# Patient Record
Sex: Female | Born: 1955 | Race: Black or African American | Hispanic: No | Marital: Single | Smoking: Never smoker
Health system: Southern US, Community
[De-identification: ages and names within clinical notes are randomized; demographics above are authoritative.]

## PROBLEM LIST (undated history)

## (undated) DIAGNOSIS — J301 Allergic rhinitis due to pollen: Secondary | ICD-10-CM

## (undated) DIAGNOSIS — E785 Hyperlipidemia, unspecified: Secondary | ICD-10-CM

## (undated) DIAGNOSIS — E039 Hypothyroidism, unspecified: Secondary | ICD-10-CM

## (undated) DIAGNOSIS — M1990 Unspecified osteoarthritis, unspecified site: Secondary | ICD-10-CM

## (undated) DIAGNOSIS — I10 Essential (primary) hypertension: Secondary | ICD-10-CM

## (undated) DIAGNOSIS — E669 Obesity, unspecified: Secondary | ICD-10-CM

## (undated) DIAGNOSIS — E079 Disorder of thyroid, unspecified: Secondary | ICD-10-CM

## (undated) HISTORY — DX: Disorder of thyroid, unspecified: E07.9

## (undated) HISTORY — DX: Hyperlipidemia, unspecified: E78.5

## (undated) HISTORY — DX: Essential (primary) hypertension: I10

## (undated) HISTORY — DX: Obesity, unspecified: E66.9

## (undated) HISTORY — PX: BIOPSY THYROID: PRO38

## (undated) HISTORY — PX: COLONOSCOPY: SHX174

## (undated) HISTORY — DX: Allergic rhinitis due to pollen: J30.1

---

## 1997-12-29 ENCOUNTER — Other Ambulatory Visit: Admission: RE | Admit: 1997-12-29 | Discharge: 1997-12-29 | Payer: Self-pay | Admitting: *Deleted

## 1998-11-07 ENCOUNTER — Other Ambulatory Visit: Admission: RE | Admit: 1998-11-07 | Discharge: 1998-11-07 | Payer: Self-pay | Admitting: *Deleted

## 1999-04-19 ENCOUNTER — Encounter: Admission: RE | Admit: 1999-04-19 | Discharge: 1999-04-19 | Payer: Self-pay | Admitting: *Deleted

## 1999-04-19 ENCOUNTER — Encounter: Payer: Self-pay | Admitting: *Deleted

## 1999-11-22 ENCOUNTER — Other Ambulatory Visit: Admission: RE | Admit: 1999-11-22 | Discharge: 1999-11-22 | Payer: Self-pay | Admitting: *Deleted

## 2000-06-12 ENCOUNTER — Other Ambulatory Visit: Admission: RE | Admit: 2000-06-12 | Discharge: 2000-06-12 | Payer: Self-pay | Admitting: *Deleted

## 2003-04-08 ENCOUNTER — Encounter: Admission: RE | Admit: 2003-04-08 | Discharge: 2003-04-08 | Payer: Self-pay | Admitting: Obstetrics and Gynecology

## 2005-10-18 ENCOUNTER — Emergency Department (HOSPITAL_COMMUNITY): Admission: EM | Admit: 2005-10-18 | Discharge: 2005-10-18 | Payer: Self-pay | Admitting: Emergency Medicine

## 2005-11-01 ENCOUNTER — Encounter: Admission: RE | Admit: 2005-11-01 | Discharge: 2005-11-26 | Payer: Self-pay | Admitting: Sports Medicine

## 2006-12-17 ENCOUNTER — Ambulatory Visit: Payer: Self-pay | Admitting: General Practice

## 2007-12-23 ENCOUNTER — Ambulatory Visit: Payer: Self-pay | Admitting: General Practice

## 2008-11-24 ENCOUNTER — Encounter: Admission: RE | Admit: 2008-11-24 | Discharge: 2008-11-24 | Payer: Self-pay | Admitting: Pulmonary Disease

## 2008-11-27 ENCOUNTER — Emergency Department (HOSPITAL_BASED_OUTPATIENT_CLINIC_OR_DEPARTMENT_OTHER): Admission: EM | Admit: 2008-11-27 | Discharge: 2008-11-27 | Payer: Self-pay | Admitting: Emergency Medicine

## 2008-12-29 ENCOUNTER — Ambulatory Visit: Payer: Self-pay | Admitting: General Practice

## 2009-12-21 ENCOUNTER — Ambulatory Visit: Payer: Self-pay | Admitting: General Practice

## 2010-01-10 ENCOUNTER — Ambulatory Visit: Payer: Self-pay | Admitting: General Practice

## 2010-12-20 ENCOUNTER — Ambulatory Visit: Payer: Self-pay

## 2011-07-12 IMAGING — CR DG CHEST 2V
2 series · 2 of 2 positions shown · non-contrast
Comparison: None

CLINICAL DATA: Cough

CHEST - 2 VIEW

[view not recorded (1 of 2)]
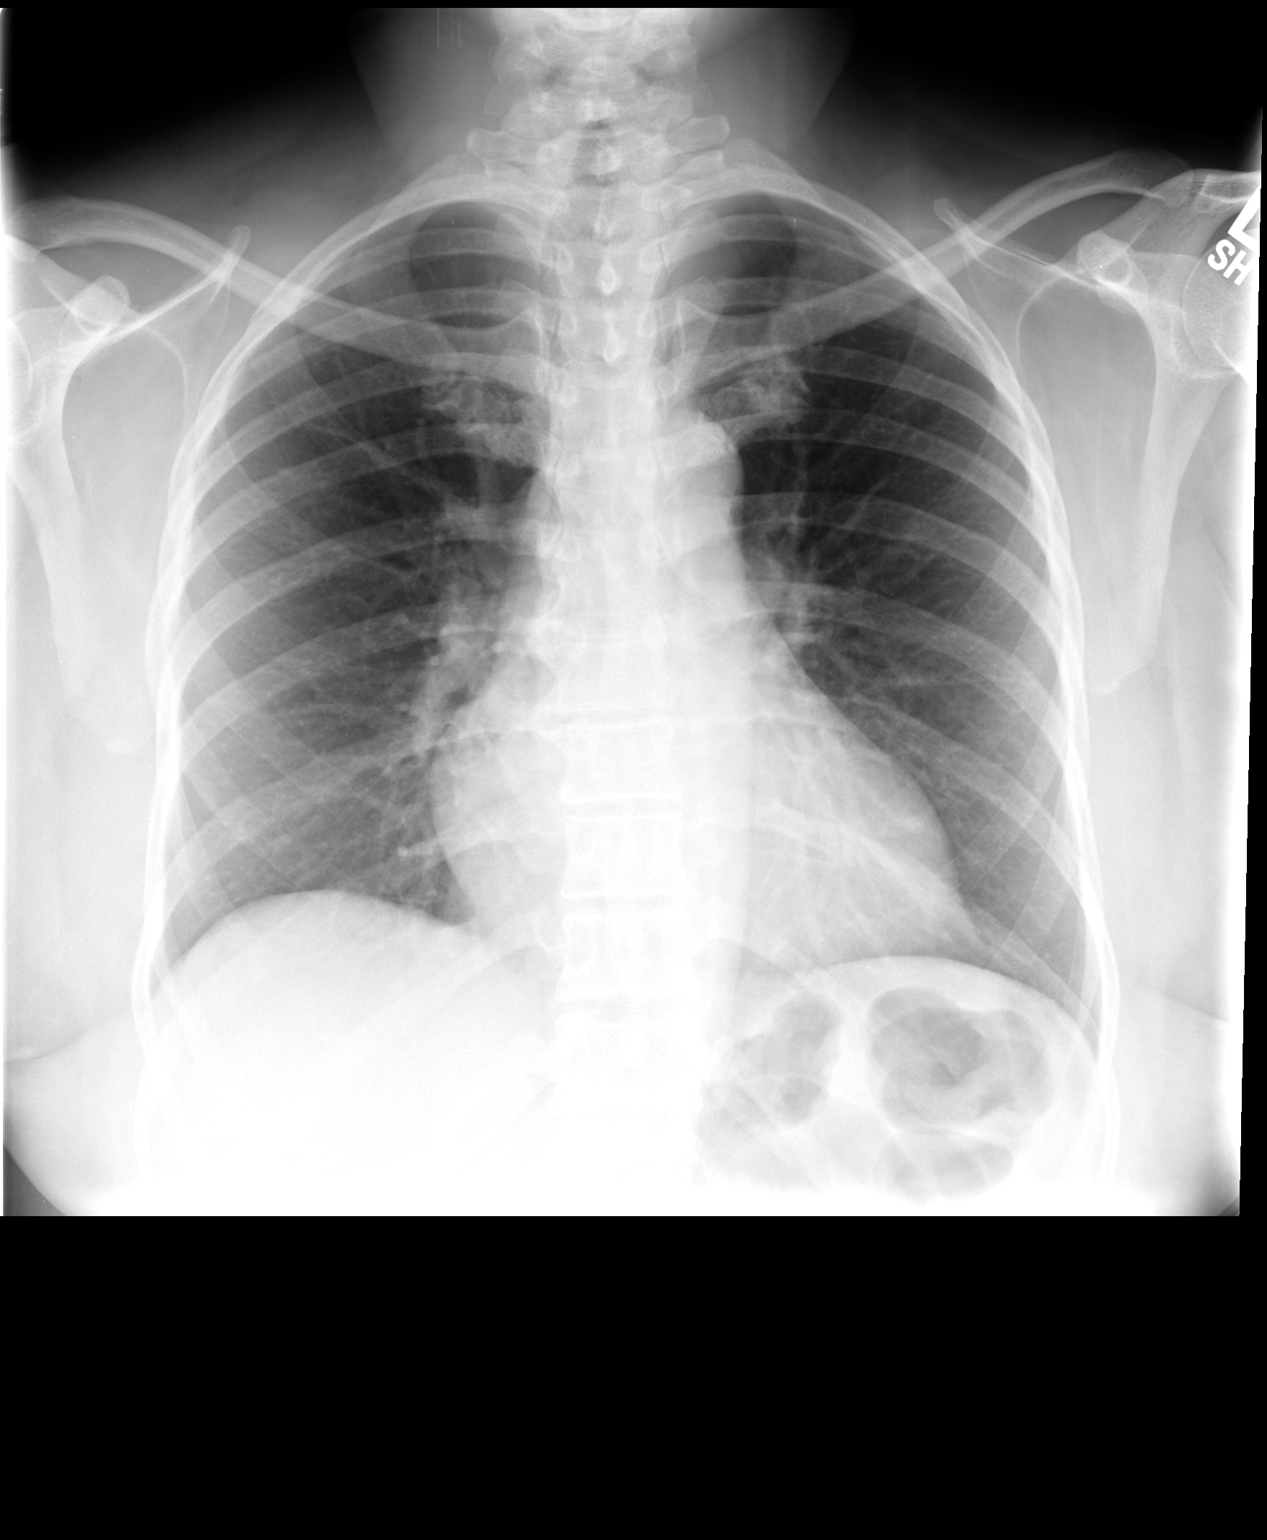

[view not recorded (2 of 2)]
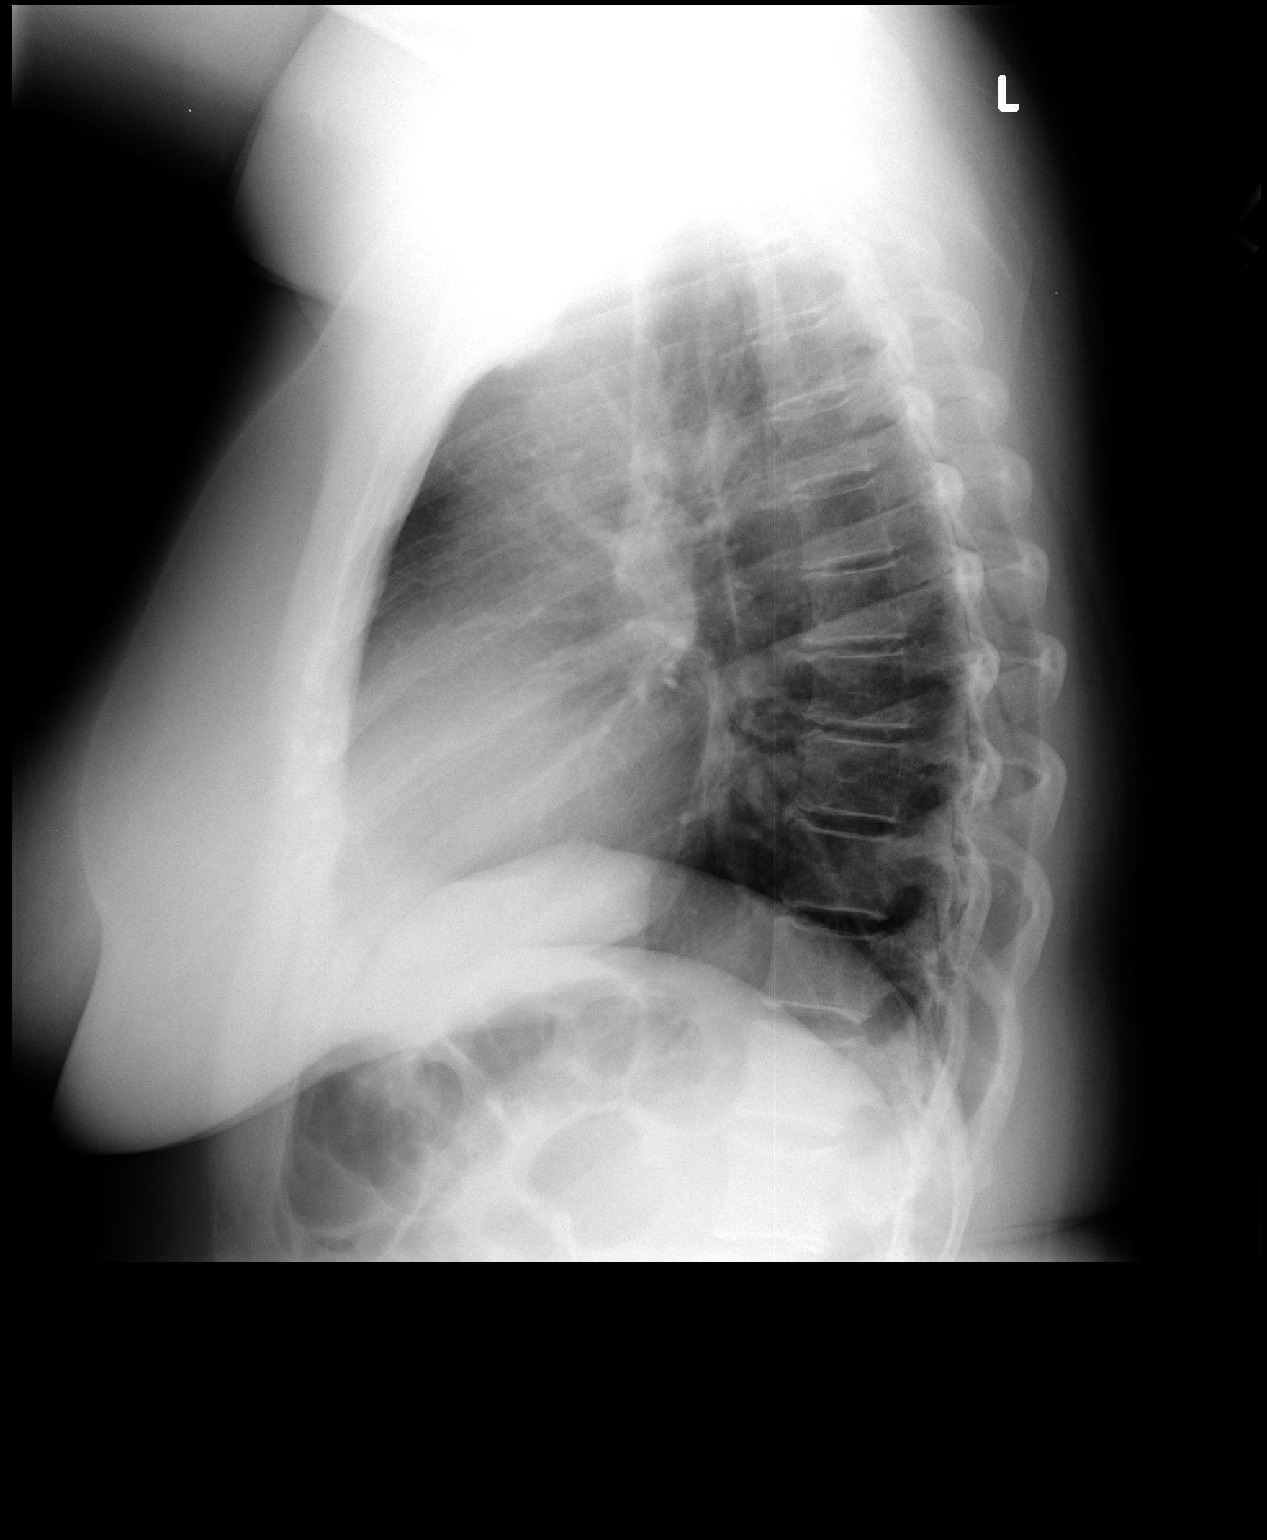

[2 of 2 positions shown; findings below may reference images not displayed]

FINDINGS: Heart size slightly exceeds normal variation.  There is
no congestive heart failure, pleural fluid, or active airspace
disease.  Osseous structures intact.
IMPRESSION: Mild cardiac enlargement - no active disease.

## 2011-12-19 ENCOUNTER — Ambulatory Visit: Payer: Self-pay

## 2012-04-21 ENCOUNTER — Encounter: Payer: Self-pay | Admitting: Family Medicine

## 2012-05-29 ENCOUNTER — Encounter: Payer: Self-pay | Admitting: Family Medicine

## 2012-05-29 ENCOUNTER — Ambulatory Visit (INDEPENDENT_AMBULATORY_CARE_PROVIDER_SITE_OTHER): Payer: PRIVATE HEALTH INSURANCE | Admitting: Family Medicine

## 2012-05-29 VITALS — BP 140/90 | HR 70 | Temp 98.1°F | Ht 61.0 in | Wt 190.4 lb

## 2012-05-29 DIAGNOSIS — I10 Essential (primary) hypertension: Secondary | ICD-10-CM

## 2012-05-29 DIAGNOSIS — M170 Bilateral primary osteoarthritis of knee: Secondary | ICD-10-CM

## 2012-05-29 DIAGNOSIS — E039 Hypothyroidism, unspecified: Secondary | ICD-10-CM

## 2012-05-29 DIAGNOSIS — Z1331 Encounter for screening for depression: Secondary | ICD-10-CM

## 2012-05-29 LAB — HEPATIC FUNCTION PANEL
AST: 34 U/L (ref 0–37)
Alkaline Phosphatase: 81 U/L (ref 39–117)
Total Bilirubin: 0.4 mg/dL (ref 0.3–1.2)

## 2012-05-29 LAB — BASIC METABOLIC PANEL
GFR: 136.2 mL/min (ref >60.00)
Potassium: 3.8 mEq/L (ref 3.5–5.1)
Sodium: 135 mEq/L (ref 135–145)

## 2012-05-29 LAB — TSH: TSH: 0.43 u[IU]/mL (ref 0.35–5.50)

## 2012-05-29 LAB — CBC WITH DIFFERENTIAL/PLATELET
Eosinophils Absolute: 0.1 10*3/uL (ref 0.0–0.7)
MCHC: 33.5 g/dL (ref 30.0–36.0)
MCV: 90.6 fl (ref 78.0–100.0)
Monocytes Absolute: 0.5 10*3/uL (ref 0.1–1.0)
Neutrophils Relative %: 56.6 % (ref 43.0–77.0)
Platelets: 203 10*3/uL (ref 150.0–400.0)
WBC: 6.4 10*3/uL (ref 4.5–10.5)

## 2012-05-29 LAB — LIPID PANEL
HDL: 65.8 mg/dL (ref >39.00)
VLDL: 16 mg/dL (ref 0.0–40.0)

## 2012-05-29 LAB — LDL CHOLESTEROL, DIRECT: Direct LDL: 146 mg/dL

## 2012-05-29 MED ORDER — NAPROXEN 500 MG PO TABS: 500.0000 mg | ORAL_TABLET | Freq: Two times a day (BID) | ORAL | Status: AC

## 2012-05-29 NOTE — Progress Notes (Signed)
  Subjective:    Patient ID: Jamie Kelley, female    DOB: 10-Jul-1955, 57 y.o.   MRN: [id number]  HPI New to establish.  Previous MD- Dagoberto Ligas.  Allergist- Foster  Has been getting acute care at work (has onsite PA)  HTN- chronic problem, 1st dx'd 6-7 yrs ago.  On Atenolol.  Reports BP is slightly elevated today compared to her usual readings.  No CP, SOB, HAs, visual changes, edema.  Hypothyroid- chronic problem, on Levothyroxine x7-8 yrs.  Has not had labs in ~1 yr.  Denies excessive fatigue, heat/cold intolerance.  Leg pain- pt reports this has been occuring at night 'they'll spasm real bad' for ~1 yr.  In last month, has developed day time sxs and having pain from knees down bilaterally.  Day time pain is concentrated in knees and radiates downward.  Will impact her gait.  Ibuprofen provides some relief.  No relief w/ flexeril.  No pain in low back or buttock.   Review of Systems For ROS see HPI     Objective:   Physical Exam  Vitals reviewed. Constitutional: She is oriented to person, place, and time. She appears well-developed and well-nourished. No distress.  HENT:  Head: Normocephalic and atraumatic.  Eyes: Conjunctivae and EOM are normal. Pupils are equal, round, and reactive to light.  Neck: Normal range of motion. Neck supple. No thyromegaly present.  Cardiovascular: Normal rate, regular rhythm, normal heart sounds and intact distal pulses.   No murmur heard. Pulmonary/Chest: Effort normal and breath sounds normal. No respiratory distress.  Abdominal: Soft. She exhibits no distension. There is no tenderness.  Musculoskeletal: She exhibits no edema.  Obvious arthritic changes of bilateral knees.  Crepitus bilaterally, R>L.  + TTP along both medial and lateral joint lines bilaterally.  Lymphadenopathy:    She has no cervical adenopathy.  Neurological: She is alert and oriented to person, place, and time.  Skin: Skin is warm and dry.  Psychiatric: She has a normal mood and  affect. Her behavior is normal.          Assessment & Plan:

## 2012-05-29 NOTE — Patient Instructions (Addendum)
Please schedule your complete physical at your convenience We'll notify you of your lab results and make any changes if needed We'll call you with your ortho appt Start the Naproxen twice daily Ice or heat your knees Call with any questions or concerns Welcome!  We're glad to have you!

## 2012-05-30 ENCOUNTER — Telehealth: Payer: Self-pay | Admitting: *Deleted

## 2012-05-30 DIAGNOSIS — E785 Hyperlipidemia, unspecified: Secondary | ICD-10-CM

## 2012-05-30 MED ORDER — SIMVASTATIN 20 MG PO TABS: 20.0000 mg | ORAL_TABLET | Freq: Every day | ORAL | Status: DC

## 2012-05-30 NOTE — Telephone Encounter (Signed)
Spoke with the pt and informed her of her recent lab results and note.  Pt understood and agreed.  Pt will call back to schedule an appt for lab visit or CPE.  New rx sent to the pharmacy by e-script, and lab order entered.//AB/CMA

## 2012-05-30 NOTE — Telephone Encounter (Signed)
Message copied by Verdie Shire on Fri May 30, 2012  9:52 AM ------      Message from: Sheliah Hatch      Created: Thu May 29, 2012  4:17 PM       Labs look good w/ exception of total cholesterol and LDL- both mildly elevated.  Based on this would recommend start Zocor 20mg  nightly.  Needs to f/u in 6 months to recheck lipids. ------

## 2012-06-01 NOTE — Assessment & Plan Note (Signed)
New.  Appears to be severe.  Start daily NSAID.  Tylenol prn.  Refer to ortho for complete evaluation and tx.  Reviewed supportive care and red flags that should prompt return.  Pt expressed understanding and is in agreement w/ plan.

## 2012-06-01 NOTE — Assessment & Plan Note (Addendum)
New to provider, chronic for pt.  Control adequate but not idea.  Will not change meds at first visit but will follow closely.  Reviewed importance of low Na diet and regular exercise.  Pt expressed understanding and is in agreement w/ plan.

## 2012-06-01 NOTE — Assessment & Plan Note (Signed)
New to provider, chronic for pt.  Was following w/ endo.  Has not had labs in over a year.  Check labs.  Adjust meds prn.

## 2012-12-25 ENCOUNTER — Ambulatory Visit: Payer: Self-pay | Admitting: General Practice

## 2013-12-22 ENCOUNTER — Ambulatory Visit: Payer: Self-pay | Admitting: General Practice

## 2013-12-22 LAB — HM MAMMOGRAPHY

## 2013-12-25 ENCOUNTER — Encounter: Payer: Self-pay | Admitting: General Practice

## 2014-03-14 ENCOUNTER — Emergency Department (HOSPITAL_COMMUNITY)
Admission: EM | Admit: 2014-03-14 | Discharge: 2014-03-14 | Disposition: A | Discharge status: Home / Self Care | Payer: PRIVATE HEALTH INSURANCE | Source: Home / Self Care | Attending: Emergency Medicine | Admitting: Emergency Medicine

## 2014-03-14 ENCOUNTER — Encounter (HOSPITAL_COMMUNITY): Payer: Self-pay | Admitting: *Deleted

## 2014-03-14 DIAGNOSIS — J018 Other acute sinusitis: Secondary | ICD-10-CM

## 2014-03-14 MED ORDER — AZITHROMYCIN 250 MG PO TABS: ORAL_TABLET | ORAL | Status: DC

## 2014-03-14 NOTE — ED Notes (Signed)
C/o  bad headache x 3 weeks, bil. earache, chills and gets hot, some cough and aching in back of neck and shoulders x 1 week.  No sore throat.  Saw a PA at work and diagnosed with sinus infection and was given Amoxicillin but is no better.

## 2014-03-14 NOTE — Discharge Instructions (Signed)
You have a sinus infection. Stop the amoxicillin. Start Azithromycin. Use nasal saline spray as often as you can. Follow up as needed.   Sinusitis Sinusitis is redness, soreness, and puffiness (inflammation) of the air pockets in the bones of your face (sinuses). The redness, soreness, and puffiness can cause air and mucus to get trapped in your sinuses. This can allow germs to grow and cause an infection.  HOME CARE   Drink enough fluids to keep your pee (urine) clear or pale yellow.  Use a humidifier in your home.  Run a hot shower to create steam in the bathroom. Sit in the bathroom with the door closed. Breathe in the steam 3-4 times a day.  Put a warm, moist washcloth on your face 3-4 times a day, or as told by your doctor.  Use salt water sprays (saline sprays) to wet the thick fluid in your nose. This can help the sinuses drain.  Only take medicine as told by your doctor. GET HELP RIGHT AWAY IF:   Your pain gets worse.  You have very bad headaches.  You are sick to your stomach (nauseous).  You throw up (vomit).  You are very sleepy (drowsy) all the time.  Your face is puffy (swollen).  Your vision changes.  You have a stiff neck.  You have trouble breathing. MAKE SURE YOU:   Understand these instructions.  Will watch your condition.  Will get help right away if you are not doing well or get worse. Document Released: 08/01/2007 Document Revised: 11/07/2011 Document Reviewed: 09/18/2011 [facility] Patient Information 2015 [place], [place]. This information is not intended to replace advice given to you by your health care provider. Make sure you discuss any questions you have with your health care provider.

## 2014-03-14 NOTE — ED Provider Notes (Signed)
CSN: [id number]     Arrival date & time 03/14/14  0901 History   First MD Initiated Contact with Patient 03/14/14 0914     Chief Complaint  Patient presents with  . Facial Pain   (Consider location/radiation/quality/duration/timing/severity/associated sxs/prior Treatment) HPI  She is a 59 year old woman here for evaluation of sinus issue. She states she has had headaches, sinus pressure, earaches, chills and sweats for the last 5 days. This is in the setting of URI symptoms 2-3 weeks ago. No nausea or vomiting. She does report fatigue. Some muscle aches in her neck and shoulders.  She was seen by a PA at work and diagnosed with a sinus infection. She was started on amoxicillin, but this has not helped.  Past Medical History  Diagnosis Date  . Hay fever   . High blood pressure   . Thyroid disease    History reviewed. No pertinent past surgical history. Family History  Problem Relation Age of Onset  . Heart disease Mother   . Hypertension Mother   . Diabetes Mother   . Alzheimer's disease Mother   . Cancer Maternal Aunt     breast  . Heart disease Maternal Aunt   . Hypertension Maternal Aunt   . Hypertension Maternal Uncle   . Heart disease Maternal Uncle   . Heart disease Maternal Grandmother   . Hypertension Maternal Grandmother   . Diabetes Maternal Grandmother    History  Substance Use Topics  . Smoking status: Never Smoker   . Smokeless tobacco: Not on file  . Alcohol Use: No   OB History    No data available     Review of Systems  Constitutional: Positive for fever and chills.  HENT: Positive for congestion and sinus pressure. Negative for rhinorrhea and sore throat.   Respiratory: Positive for cough. Negative for shortness of breath.   Gastrointestinal: Negative for nausea, vomiting and abdominal pain.  Musculoskeletal: Positive for myalgias.  Neurological: Positive for headaches.    Allergies  Review of patient's allergies indicates no known  allergies.  Home Medications   Prior to Admission medications   Medication Sig Start Date End Date Taking? Authorizing Provider  atenolol (TENORMIN) 50 MG tablet Take 100 mg by mouth daily.   Yes Historical Provider, MD  diphenhydrAMINE (BENADRYL) 25 mg capsule Take 25 mg by mouth every 6 (six) hours as needed for itching.   Yes Historical Provider, MD  ibuprofen (ADVIL,MOTRIN) 200 MG tablet Take 400 mg by mouth every 6 (six) hours as needed for pain.   Yes Historical Provider, MD  levothyroxine (SYNTHROID, LEVOTHROID) 50 MCG tablet Take 25 mcg by mouth daily before breakfast.   Yes Historical Provider, MD  simvastatin (ZOCOR) 20 MG tablet Take 1 tablet (20 mg total) by mouth at bedtime. 05/30/12  Yes Midge Minium, MD  azithromycin (ZITHROMAX Z-PAK) 250 MG tablet Take 2 pills today, then 1 pill daily until gone 03/14/14   Melony Overly, MD  benzonatate (TESSALON) 200 MG capsule Take 200 mg by mouth 3 (three) times daily as needed for cough.    Historical Provider, MD  Chlorpheniramine-DM (CORICIDIN COUGH/COLD) 4-30 MG TABS Take 2 tablets by mouth 2 (two) times daily.    Historical Provider, MD  chlorpheniramine-HYDROcodone (TUSSIONEX) 10-8 MG/5ML LQCR Take 5 mLs by mouth at bedtime as needed.    Historical Provider, MD  cyclobenzaprine (FLEXERIL) 10 MG tablet Take 10 mg by mouth at bedtime as needed for muscle spasms.    Historical Provider, MD  Homeopathic Products ([employer] LEG CRAMPS PAIN RELIEF PO) Take 2 capsules by mouth at bedtime as needed.    Historical Provider, MD   BP 164/99 mmHg  Pulse 68  Temp(Src) 99 F (37.2 C) (Oral)  Resp 16  SpO2 98%  LMP 03/10/2012 Physical Exam  Constitutional: She is oriented to person, place, and time. She appears well-developed and well-nourished. No distress.  HENT:  Head: Normocephalic and atraumatic.  Right Ear: Tympanic membrane normal.  Left Ear: Tympanic membrane normal.  Nose: Mucosal edema (with erythema) present. Right sinus exhibits  maxillary sinus tenderness and frontal sinus tenderness. Left sinus exhibits no maxillary sinus tenderness and no frontal sinus tenderness.  Mouth/Throat: Oropharynx is clear and moist. No oropharyngeal exudate.  Eyes: Conjunctivae are normal.  Neck: Neck supple.  Cardiovascular: Normal rate, regular rhythm and normal heart sounds.   No murmur heard. Pulmonary/Chest: Effort normal and breath sounds normal. No respiratory distress. She has no wheezes. She has no rales.  Lymphadenopathy:    She has no cervical adenopathy.  Neurological: She is alert and oriented to person, place, and time.    ED Course  Procedures (including critical care time) Labs Review Labs Reviewed - No data to display  Imaging Review No results found.   MDM   1. Other acute sinusitis    We'll change antibiotic to azithromycin. Also recommended frequent use of nasal saline spray. Follow-up as needed.    Melony Overly, MD 03/14/14 [phone]

## 2014-10-22 ENCOUNTER — Encounter: Payer: Self-pay | Admitting: Family Medicine

## 2014-10-22 ENCOUNTER — Ambulatory Visit (INDEPENDENT_AMBULATORY_CARE_PROVIDER_SITE_OTHER): Payer: PRIVATE HEALTH INSURANCE | Admitting: Family Medicine

## 2014-10-22 ENCOUNTER — Encounter: Payer: Self-pay | Admitting: General Practice

## 2014-10-22 VITALS — BP 134/82 | HR 86 | Temp 98.0°F | Resp 16 | Ht 61.0 in | Wt 196.0 lb

## 2014-10-22 DIAGNOSIS — E785 Hyperlipidemia, unspecified: Secondary | ICD-10-CM

## 2014-10-22 DIAGNOSIS — E010 Iodine-deficiency related diffuse (endemic) goiter: Secondary | ICD-10-CM | POA: Insufficient documentation

## 2014-10-22 DIAGNOSIS — I10 Essential (primary) hypertension: Secondary | ICD-10-CM

## 2014-10-22 DIAGNOSIS — E038 Other specified hypothyroidism: Secondary | ICD-10-CM

## 2014-10-22 DIAGNOSIS — E049 Nontoxic goiter, unspecified: Secondary | ICD-10-CM | POA: Diagnosis not present

## 2014-10-22 LAB — HEPATIC FUNCTION PANEL
ALK PHOS: 76 U/L (ref 39–117)
ALT: 16 U/L (ref 0–35)
AST: 19 U/L (ref 0–37)
Albumin: 4 g/dL (ref 3.5–5.2)
BILIRUBIN DIRECT: 0.1 mg/dL (ref 0.0–0.3)
TOTAL PROTEIN: 7.1 g/dL (ref 6.0–8.3)
Total Bilirubin: 0.5 mg/dL (ref 0.2–1.2)

## 2014-10-22 LAB — CBC WITH DIFFERENTIAL/PLATELET
BASOS PCT: 0.6 % (ref 0.0–3.0)
Basophils Absolute: 0 10*3/uL (ref 0.0–0.1)
EOS ABS: 0.1 10*3/uL (ref 0.0–0.7)
Eosinophils Relative: 1.7 % (ref 0.0–5.0)
HEMATOCRIT: 39.8 % (ref 36.0–46.0)
Hemoglobin: 13.4 g/dL (ref 12.0–15.0)
Lymphocytes Relative: 32.4 % (ref 12.0–46.0)
Lymphs Abs: 1.9 10*3/uL (ref 0.7–4.0)
MCHC: 33.7 g/dL (ref 30.0–36.0)
MCV: 89.9 fl (ref 78.0–100.0)
MONO ABS: 0.4 10*3/uL (ref 0.1–1.0)
Monocytes Relative: 6.7 % (ref 3.0–12.0)
NEUTROS ABS: 3.5 10*3/uL (ref 1.4–7.7)
Neutrophils Relative %: 58.6 % (ref 43.0–77.0)
PLATELETS: 189 10*3/uL (ref 150.0–400.0)
RBC: 4.43 Mil/uL (ref 3.87–5.11)
RDW: 15.1 % (ref 11.5–15.5)
WBC: 6 10*3/uL (ref 4.0–10.5)

## 2014-10-22 LAB — BASIC METABOLIC PANEL
BUN: 13 mg/dL (ref 6–23)
CHLORIDE: 106 meq/L (ref 96–112)
CO2: 28 meq/L (ref 19–32)
Calcium: 9.3 mg/dL (ref 8.4–10.5)
Creatinine, Ser: 0.6 mg/dL (ref 0.40–1.20)
GFR: 132.43 mL/min (ref >60.00)
Glucose, Bld: 92 mg/dL (ref 70–99)
Potassium: 3.8 mEq/L (ref 3.5–5.1)
SODIUM: 142 meq/L (ref 135–145)

## 2014-10-22 LAB — LIPID PANEL
Cholesterol: 156 mg/dL (ref 0–200)
HDL: 62.4 mg/dL (ref >39.00)
LDL Cholesterol: 81 mg/dL (ref 0–99)
NONHDL: 93.95
Total CHOL/HDL Ratio: 3
Triglycerides: 67 mg/dL (ref 0.0–149.0)
VLDL: 13.4 mg/dL (ref 0.0–40.0)

## 2014-10-22 LAB — TSH: TSH: 0.97 u[IU]/mL (ref 0.35–4.50)

## 2014-10-22 MED ORDER — ATENOLOL 50 MG PO TABS: 100.0000 mg | ORAL_TABLET | Freq: Every day | ORAL | Status: DC

## 2014-10-22 MED ORDER — SIMVASTATIN 20 MG PO TABS: 20.0000 mg | ORAL_TABLET | Freq: Every day | ORAL | Status: DC

## 2014-10-22 MED ORDER — HYDROCHLOROTHIAZIDE 25 MG PO TABS: 25.0000 mg | ORAL_TABLET | Freq: Every day | ORAL | Status: DC

## 2014-10-22 MED ORDER — LEVOTHYROXINE SODIUM 50 MCG PO TABS: 25.0000 ug | ORAL_TABLET | Freq: Every day | ORAL | Status: DC

## 2014-10-22 NOTE — Progress Notes (Signed)
   Subjective:    Patient ID: Jamie Kelley, female    DOB: May 27, 1955, 59 y.o.   MRN: [id number]  HPI HTN- chronic problem, on Atenolol, HCTZ.  BP mildly elevated today.  No CP, SOB, HAs, visual changes, edema.  Pt has NP at work that she sees yearly for blood work and periodically for BP checks.  Hyperlipidemia- chronic problem, on Simvastatin daily.  Overdue for labs.  No abd pain, N/V, myalgias.  No regular exercise.  Hypothyroid- chronic problem, on Levothyroxine 19mcg daily.  Denies excessive fatigue.  No changes to skin, hair, nails.  Review of Systems For ROS see HPI     Objective:   Physical Exam  Constitutional: She is oriented to person, place, and time. She appears well-developed and well-nourished. No distress.  HENT:  Head: Normocephalic and atraumatic.  Eyes: Conjunctivae and EOM are normal. Pupils are equal, round, and reactive to light.  Neck: Normal range of motion. Neck supple. Thyromegaly (mild fullness w/o nodularity) present.  Cardiovascular: Normal rate, regular rhythm, normal heart sounds and intact distal pulses.   No murmur heard. Pulmonary/Chest: Effort normal and breath sounds normal. No respiratory distress.  Abdominal: Soft. She exhibits no distension. There is no tenderness.  Musculoskeletal: She exhibits no edema.  Lymphadenopathy:    She has no cervical adenopathy.  Neurological: She is alert and oriented to person, place, and time.  Skin: Skin is warm and dry.  Psychiatric: She has a normal mood and affect. Her behavior is normal.  Vitals reviewed.         Assessment & Plan:

## 2014-10-22 NOTE — Patient Instructions (Signed)
Schedule your complete physical in 6 months We'll notify you of your lab results and make any changes if needed We'll call you with your thyroid ultrasound appt Try and make healthy food choices and get regular exercise Call with any questions or concerns Happy Labor Day!!!

## 2014-10-22 NOTE — Progress Notes (Signed)
Pre visit review using our clinic review tool, if applicable. No additional management support is needed unless otherwise documented below in the visit note. 

## 2014-10-23 NOTE — Assessment & Plan Note (Signed)
Chronic problem.  Asymptomatic.  Check labs.  Adjust meds prn  

## 2014-10-23 NOTE — Assessment & Plan Note (Signed)
Get [place] to assess.  Pt expressed understanding and is in agreement w/ plan.

## 2014-10-23 NOTE — Assessment & Plan Note (Signed)
Chronic problem.  Adequate control.  Asymptomatic.  No anticipated med changes.

## 2014-10-23 NOTE — Assessment & Plan Note (Signed)
Chronic problem.  Tolerating statin w/o difficulty.  Check labs.  Adjust meds prn.  Encouraged healthy diet and regular exercise.  Will follow. 

## 2014-11-12 ENCOUNTER — Ambulatory Visit
Admission: RE | Admit: 2014-11-12 | Discharge: 2014-11-12 | Disposition: A | Discharge status: Home / Self Care | Payer: PRIVATE HEALTH INSURANCE | Source: Ambulatory Visit | Attending: Family Medicine | Admitting: Family Medicine

## 2014-11-12 DIAGNOSIS — E010 Iodine-deficiency related diffuse (endemic) goiter: Secondary | ICD-10-CM

## 2014-11-15 ENCOUNTER — Other Ambulatory Visit: Payer: Self-pay | Admitting: Family Medicine

## 2014-11-15 DIAGNOSIS — E041 Nontoxic single thyroid nodule: Secondary | ICD-10-CM

## 2014-11-17 ENCOUNTER — Telehealth: Payer: Self-pay | Admitting: Family Medicine

## 2014-11-17 NOTE — Telephone Encounter (Signed)
ENT's are trained in neck biopsies and surgeries.  Endocrinology only addresses the hormonal aspect of the thyroid.  When multiple nodules are found- particularly if they are large in size and may require biopsy, I refer to ENT for complete evaluation and treatment.  Endocrinology can be brought in later, if needed, for hormonal management.

## 2014-11-17 NOTE — Telephone Encounter (Signed)
Pt calling in regard to referral to ENT. She is asking why ENT and not an endrocrinologist or thyroid specialist. She is requesting call back @ [phone].

## 2014-11-18 NOTE — Telephone Encounter (Signed)
Pt informed and expressed and understanding.

## 2014-11-19 ENCOUNTER — Ambulatory Visit (INDEPENDENT_AMBULATORY_CARE_PROVIDER_SITE_OTHER): Payer: PRIVATE HEALTH INSURANCE | Admitting: Family Medicine

## 2014-11-19 ENCOUNTER — Telehealth: Payer: Self-pay | Admitting: Family Medicine

## 2014-11-19 ENCOUNTER — Encounter: Payer: Self-pay | Admitting: Family Medicine

## 2014-11-19 ENCOUNTER — Telehealth: Payer: Self-pay | Admitting: *Deleted

## 2014-11-19 VITALS — BP 176/98 | HR 66 | Temp 99.0°F | Wt 196.0 lb

## 2014-11-19 DIAGNOSIS — I10 Essential (primary) hypertension: Secondary | ICD-10-CM

## 2014-11-19 DIAGNOSIS — R071 Chest pain on breathing: Secondary | ICD-10-CM | POA: Diagnosis not present

## 2014-11-19 DIAGNOSIS — R079 Chest pain, unspecified: Secondary | ICD-10-CM | POA: Diagnosis not present

## 2014-11-19 LAB — COMPREHENSIVE METABOLIC PANEL
ALBUMIN: 4.3 g/dL (ref 3.5–5.2)
ALT: 19 U/L (ref 0–35)
AST: 22 U/L (ref 0–37)
Alkaline Phosphatase: 75 U/L (ref 39–117)
BUN: 18 mg/dL (ref 6–23)
CALCIUM: 9.7 mg/dL (ref 8.4–10.5)
CHLORIDE: 106 meq/L (ref 96–112)
CO2: 30 meq/L (ref 19–32)
CREATININE: 0.6 mg/dL (ref 0.40–1.20)
GFR: 132.39 mL/min (ref >60.00)
Glucose, Bld: 100 mg/dL — ABNORMAL HIGH (ref 70–99)
Potassium: 3.6 mEq/L (ref 3.5–5.1)
SODIUM: 142 meq/L (ref 135–145)
Total Bilirubin: 0.5 mg/dL (ref 0.2–1.2)
Total Protein: 7.3 g/dL (ref 6.0–8.3)

## 2014-11-19 LAB — CBC
HEMATOCRIT: 40 % (ref 36.0–46.0)
Hemoglobin: 13.3 g/dL (ref 12.0–15.0)
MCHC: 33.2 g/dL (ref 30.0–36.0)
MCV: 89.7 fl (ref 78.0–100.0)
Platelets: 153 10*3/uL (ref 150.0–400.0)
RBC: 4.45 Mil/uL (ref 3.87–5.11)
RDW: 15 % (ref 11.5–15.5)
WBC: 7.6 10*3/uL (ref 4.0–10.5)

## 2014-11-19 LAB — TROPONIN I: TNIDX: 0.01 ug/L (ref 0.00–0.06)

## 2014-11-19 LAB — D-DIMER, QUANTITATIVE (NOT AT ARMC): D DIMER QUANT: 0.45 ug{FEU}/mL (ref 0.00–0.48)

## 2014-11-19 MED ORDER — AMLODIPINE BESYLATE 5 MG PO TABS: 5.0000 mg | ORAL_TABLET | Freq: Every day | ORAL | Status: DC

## 2014-11-19 NOTE — Assessment & Plan Note (Signed)
Worse with breathing Pt refused ER Will get stat troponin Pt instructed to go to ER if chest pain worsens-- she understood and agreed

## 2014-11-19 NOTE — Progress Notes (Signed)
Pre visit review using our clinic review tool, if applicable. No additional management support is needed unless otherwise documented below in the visit note. 

## 2014-11-19 NOTE — Telephone Encounter (Signed)
D dimer and troponin neg

## 2014-11-19 NOTE — Telephone Encounter (Signed)
Patient is aware and aware the rest of the labs are WNL and she verbalized understanding.      KP

## 2014-11-19 NOTE — Telephone Encounter (Signed)
Patient called to schedule an appointment with Dr. Birdie Riddle for elevated BP- no appointments available, so patient scheduled with Dr. Etter Sjogren.    Patient states her BP was taken at work and it was 171/101.  She was told to return in an hour for recheck.  At recheck her BP was 177/110.  No headaches, blurred vision, or shortness of breath.  Patient states she has been having a pain mid chest and back after lifting equipment one week ago.  She was already seen by PA at her work for this pain and he told her it was related to muscle strain and to rest.  No radiating pain.  Pain is exacerbating during movement or if she coughs.    Patient scheduled with Dr. Etter Sjogren at 1:00 for elevated BP.

## 2014-11-19 NOTE — Patient Instructions (Signed)

## 2014-11-19 NOTE — Progress Notes (Signed)
Patient ID: Jamie Kelley, female   DOB: [birth year], 59 y.o.   MRN: [id number]   Subjective:    Patient ID: Jamie Kelley, female    DOB: March 22, 1955, 59 y.o.   MRN: [id number]  Chief Complaint  Patient presents with  . Hypertension    Elevated BP x's a few weeks    HPI Patient is in today for c/o elevated bp for 1 1/2 weeks.  She then admitted to chest pain that radiates to back on left.  No sob, no palpitations, no headaches or visual changes.   Pt admits to lifting heavy things recently and thinks that is why her chest hurts.    Past Medical History  Diagnosis Date  . Hay fever   . High blood pressure   . Thyroid disease   . Hyperlipidemia   . Obesity     No past surgical history on file.  Family History  Problem Relation Age of Onset  . Heart disease Mother   . Hypertension Mother   . Diabetes Mother   . Alzheimer's disease Mother   . Cancer Maternal Aunt     breast  . Heart disease Maternal Aunt   . Hypertension Maternal Aunt   . Hypertension Maternal Uncle   . Heart disease Maternal Uncle   . Heart disease Maternal Grandmother   . Hypertension Maternal Grandmother   . Diabetes Maternal Grandmother     Social History   Social History  . Marital Status: Single    Spouse Name: N/A  . Number of Children: N/A  . Years of Education: N/A   Occupational History  . Not on file.   Social History Main Topics  . Smoking status: Never Smoker   . Smokeless tobacco: Not on file  . Alcohol Use: No  . Drug Use: No  . Sexual Activity: Not on file   Other Topics Concern  . Not on file   Social History Narrative    Outpatient Prescriptions Prior to Visit  Medication Sig Dispense Refill  . atenolol (TENORMIN) 50 MG tablet Take 2 tablets (100 mg total) by mouth daily. 30 tablet 6  . Homeopathic Products ([employer] LEG CRAMPS PAIN RELIEF PO) Take 2 capsules by mouth at bedtime as needed.    . hydrochlorothiazide (HYDRODIURIL) 25 MG tablet Take 1 tablet (25 mg total) by mouth  daily. 30 tablet 6  . levothyroxine (SYNTHROID, LEVOTHROID) 50 MCG tablet Take 0.5 tablets (25 mcg total) by mouth daily before breakfast. 30 tablet 3  . simvastatin (ZOCOR) 20 MG tablet Take 1 tablet (20 mg total) by mouth at bedtime. 90 tablet 1  . cyclobenzaprine (FLEXERIL) 10 MG tablet Take 10 mg by mouth at bedtime as needed for muscle spasms.    . diphenhydrAMINE (BENADRYL) 25 mg capsule Take 25 mg by mouth every 6 (six) hours as needed for itching.    Marland Kitchen ibuprofen (ADVIL,MOTRIN) 200 MG tablet Take 400 mg by mouth every 6 (six) hours as needed for pain.     No facility-administered medications prior to visit.    No Known Allergies  Review of Systems  Constitutional: Negative for fever and malaise/fatigue.  HENT: Negative for congestion.   Eyes: Negative for discharge.  Respiratory: Negative for shortness of breath.   Cardiovascular: Positive for chest pain. Negative for palpitations and leg swelling.  Gastrointestinal: Negative for nausea and abdominal pain.  Genitourinary: Negative for dysuria.  Musculoskeletal: Negative for falls.  Skin: Negative for rash.  Neurological: Negative for loss of consciousness and  headaches.  Endo/Heme/Allergies: Negative for environmental allergies.  Psychiatric/Behavioral: Negative for depression. The patient is not nervous/anxious.        Objective:    Physical Exam  Constitutional: She is oriented to person, place, and time. She appears well-developed and well-nourished.  HENT:  Head: Normocephalic and atraumatic.  Eyes: Conjunctivae and EOM are normal.  Neck: Normal range of motion. Neck supple. No JVD present. Carotid bruit is not present. No thyromegaly present.  Cardiovascular: Normal rate, regular rhythm and normal heart sounds.   No murmur heard. Pulmonary/Chest: Effort normal and breath sounds normal. No respiratory distress. She has no wheezes. She has no rales. She exhibits no tenderness.  Musculoskeletal: She exhibits no edema.    Neurological: She is alert and oriented to person, place, and time.  Psychiatric: She has a normal mood and affect. Her behavior is normal.    BP 176/98 mmHg  Pulse 66  Temp(Src) 99 F (37.2 C) (Oral)  Wt 196 lb (88.905 kg)  SpO2 97%  LMP 03/10/2012 Wt Readings from Last 3 Encounters:  11/19/14 196 lb (88.905 kg)  10/22/14 196 lb (88.905 kg)  05/29/12 190 lb 6.4 oz (86.365 kg)     Lab Results  Component Value Date   WBC 7.6 11/19/2014   HGB 13.3 11/19/2014   HCT 40.0 11/19/2014   PLT 153.0 11/19/2014   GLUCOSE 100* 11/19/2014   CHOL 156 10/22/2014   TRIG 67.0 10/22/2014   HDL 62.40 10/22/2014   LDLDIRECT 146.0 05/29/2012   LDLCALC 81 10/22/2014   ALT 19 11/19/2014   AST 22 11/19/2014   NA 142 11/19/2014   K 3.6 11/19/2014   CL 106 11/19/2014   CREATININE 0.60 11/19/2014   BUN 18 11/19/2014   CO2 30 11/19/2014   TSH 0.97 10/22/2014    Lab Results  Component Value Date   TSH 0.97 10/22/2014   Lab Results  Component Value Date   WBC 7.6 11/19/2014   HGB 13.3 11/19/2014   HCT 40.0 11/19/2014   MCV 89.7 11/19/2014   PLT 153.0 11/19/2014   Lab Results  Component Value Date   NA 142 11/19/2014   K 3.6 11/19/2014   CO2 30 11/19/2014   GLUCOSE 100* 11/19/2014   BUN 18 11/19/2014   CREATININE 0.60 11/19/2014   BILITOT 0.5 11/19/2014   ALKPHOS 75 11/19/2014   AST 22 11/19/2014   ALT 19 11/19/2014   PROT 7.3 11/19/2014   ALBUMIN 4.3 11/19/2014   CALCIUM 9.7 11/19/2014   GFR 132.39 11/19/2014   Lab Results  Component Value Date   CHOL 156 10/22/2014   Lab Results  Component Value Date   HDL 62.40 10/22/2014   Lab Results  Component Value Date   LDLCALC 81 10/22/2014   Lab Results  Component Value Date   TRIG 67.0 10/22/2014   Lab Results  Component Value Date   CHOLHDL 3 10/22/2014   No results found for: HGBA1C  ekg--  Ant/lat and inf t wave changes   Assessment & Plan:   Problem List Items Addressed This Visit    HTN  (hypertension)    Add norvasc 5 mg rto 2-3 weeks or sooner prn      Relevant Medications   amLODipine (NORVASC) 5 MG tablet   Chest pain    Worse with breathing Pt refused ER Will get stat troponin Pt instructed to go to ER if chest pain worsens-- she understood and agreed      Relevant Orders   EKG  12-Lead (Completed)   Comprehensive metabolic panel (Completed)   CBC (Completed)   Troponin I (Completed)   D-Dimer, Quantitative (Completed)    Other Visit Diagnoses    Chest pain, unspecified chest pain type    -  Primary    Relevant Orders    EKG 12-Lead (Completed)    Comprehensive metabolic panel (Completed)    CBC (Completed)    Troponin I (Completed)    D-Dimer, Quantitative (Completed)       I have discontinued Ms. Slatten's cyclobenzaprine, diphenhydrAMINE, and ibuprofen. I am also having her start on amLODipine. Additionally, I am having her maintain her Homeopathic Products ([employer] LEG CRAMPS PAIN RELIEF PO), atenolol, hydrochlorothiazide, simvastatin, levothyroxine, and diclofenac.  Meds ordered this encounter  Medications  . diclofenac (VOLTAREN) 75 MG EC tablet    Sig: Take 75 mg by mouth 2 (two) times daily.  Marland Kitchen amLODipine (NORVASC) 5 MG tablet    Sig: Take 1 tablet (5 mg total) by mouth daily.    Dispense:  90 tablet    Refill:  [place], DO

## 2014-11-19 NOTE — Telephone Encounter (Signed)
Caller name: [facility]   Relationship to patient:   Can be reached: [phone]  Pharmacy:  Reason for call:   Stat d-dimer   Normal 0.45  She will also send a copy to provider.

## 2014-11-19 NOTE — Assessment & Plan Note (Signed)
Add norvasc 5 mg rto 2-3 weeks or sooner prn

## 2014-11-24 ENCOUNTER — Other Ambulatory Visit: Payer: Self-pay | Admitting: Otolaryngology

## 2014-11-24 DIAGNOSIS — D497 Neoplasm of unspecified behavior of endocrine glands and other parts of nervous system: Secondary | ICD-10-CM

## 2014-11-26 ENCOUNTER — Other Ambulatory Visit: Payer: Self-pay

## 2014-12-01 ENCOUNTER — Ambulatory Visit
Admission: RE | Admit: 2014-12-01 | Discharge: 2014-12-01 | Disposition: A | Discharge status: Home / Self Care | Payer: PRIVATE HEALTH INSURANCE | Source: Ambulatory Visit | Attending: Otolaryngology | Admitting: Otolaryngology

## 2014-12-01 ENCOUNTER — Other Ambulatory Visit (HOSPITAL_COMMUNITY)
Admission: RE | Admit: 2014-12-01 | Discharge: 2014-12-01 | Disposition: A | Discharge status: Home / Self Care | Payer: PRIVATE HEALTH INSURANCE | Source: Ambulatory Visit | Attending: Radiology | Admitting: Radiology

## 2014-12-01 DIAGNOSIS — E041 Nontoxic single thyroid nodule: Secondary | ICD-10-CM | POA: Diagnosis not present

## 2014-12-01 DIAGNOSIS — D497 Neoplasm of unspecified behavior of endocrine glands and other parts of nervous system: Secondary | ICD-10-CM

## 2014-12-03 ENCOUNTER — Ambulatory Visit (INDEPENDENT_AMBULATORY_CARE_PROVIDER_SITE_OTHER): Payer: PRIVATE HEALTH INSURANCE | Admitting: Family Medicine

## 2014-12-03 ENCOUNTER — Encounter: Payer: Self-pay | Admitting: Family Medicine

## 2014-12-03 VITALS — BP 150/82 | HR 63 | Temp 98.1°F | Ht 61.0 in | Wt 196.0 lb

## 2014-12-03 DIAGNOSIS — I10 Essential (primary) hypertension: Secondary | ICD-10-CM

## 2014-12-03 MED ORDER — AMLODIPINE BESYLATE 10 MG PO TABS: 10.0000 mg | ORAL_TABLET | Freq: Every day | ORAL | Status: DC

## 2014-12-03 NOTE — Progress Notes (Signed)
Pre visit review using our clinic review tool, if applicable. No additional management support is needed unless otherwise documented below in the visit note. 

## 2014-12-03 NOTE — Patient Instructions (Signed)
Follow up in 1 month to recheck BP Increase Amlodipine to 10mg  daily- 2 of what you have at home and 1 of the new prescription Continue the HCTZ and atenolol daily Continue to eat a low salt diet and get regular exercise to improve BP Call with any questions or concerns Hang in there!!!

## 2014-12-03 NOTE — Progress Notes (Signed)
   Subjective:    Patient ID: Jamie Kelley, female    DOB: 1955/08/21, 59 y.o.   MRN: [id number]  HPI HTN- pt was sent in a few weeks ago by RN at work due to elevated BPs.  Was started on Amlodipine in addition to the HCTZ and the atenolol.  No CP, SOB, HAs, visual changes, edema.  Increased stress recently.   Review of Systems For ROS see HPI     Objective:   Physical Exam  Constitutional: She is oriented to person, place, and time. She appears well-developed and well-nourished. No distress.  HENT:  Head: Normocephalic and atraumatic.  Eyes: Conjunctivae and EOM are normal. Pupils are equal, round, and reactive to light.  Neck: Normal range of motion. Neck supple. No thyromegaly present.  Cardiovascular: Normal rate, regular rhythm, normal heart sounds and intact distal pulses.   No murmur heard. Pulmonary/Chest: Effort normal and breath sounds normal. No respiratory distress.  Abdominal: Soft. She exhibits no distension. There is no tenderness.  Musculoskeletal: She exhibits no edema.  Lymphadenopathy:    She has no cervical adenopathy.  Neurological: She is alert and oriented to person, place, and time.  Skin: Skin is warm and dry.  Psychiatric: She has a normal mood and affect. Her behavior is normal.  Vitals reviewed.         Assessment & Plan:

## 2014-12-03 NOTE — Assessment & Plan Note (Signed)
Improved since last visit but still not at goal.  Asymptomatic.  Will increase Amlodipine to 10mg  daily.  Continue Atenolol and HCTZ.  Reviewed importance of low Na diet and regular exercise.  Will continue to follow.

## 2014-12-06 ENCOUNTER — Telehealth: Payer: Self-pay | Admitting: Family Medicine

## 2014-12-06 NOTE — Telephone Encounter (Signed)
Caller name: Nicholle Falzon  Relationship to patient: Self   Can be reached:[phone] (cell)  [phone] (fax)    Reason for call: Pt says that she was told that you would fax over to her a copy of her Rx (amLODipine). 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

## 2014-12-07 ENCOUNTER — Other Ambulatory Visit: Payer: Self-pay | Admitting: Family Medicine

## 2014-12-07 DIAGNOSIS — Z1231 Encounter for screening mammogram for malignant neoplasm of breast: Secondary | ICD-10-CM

## 2014-12-07 MED ORDER — AMLODIPINE BESYLATE 10 MG PO TABS: 10.0000 mg | ORAL_TABLET | Freq: Every day | ORAL | Status: DC

## 2014-12-07 NOTE — Telephone Encounter (Signed)
rx printed and faxed as requested 

## 2014-12-14 ENCOUNTER — Ambulatory Visit
Admission: RE | Admit: 2014-12-14 | Discharge: 2014-12-14 | Disposition: A | Discharge status: Home / Self Care | Payer: PRIVATE HEALTH INSURANCE | Source: Ambulatory Visit | Attending: Family Medicine | Admitting: Family Medicine

## 2014-12-14 DIAGNOSIS — Z1231 Encounter for screening mammogram for malignant neoplasm of breast: Secondary | ICD-10-CM | POA: Insufficient documentation

## 2015-01-14 ENCOUNTER — Encounter: Payer: Self-pay | Admitting: Family Medicine

## 2015-01-14 ENCOUNTER — Ambulatory Visit (INDEPENDENT_AMBULATORY_CARE_PROVIDER_SITE_OTHER): Payer: PRIVATE HEALTH INSURANCE | Admitting: Family Medicine

## 2015-01-14 VITALS — BP 140/78 | HR 65 | Temp 98.4°F | Resp 16 | Ht 61.0 in | Wt 196.5 lb

## 2015-01-14 DIAGNOSIS — Z23 Encounter for immunization: Secondary | ICD-10-CM | POA: Diagnosis not present

## 2015-01-14 DIAGNOSIS — I10 Essential (primary) hypertension: Secondary | ICD-10-CM | POA: Diagnosis not present

## 2015-01-14 NOTE — Progress Notes (Signed)
   Subjective:    Patient ID: Jamie Kelley, female    DOB: 1955/04/25, 59 y.o.   MRN: [id number]  HPI HTN- chronic problem.  We increased the Amlodipine to 10mg  at last visit.  BP has again improved.  Pt on Atenolol and HCTZ also.  No CP, SOB, HAs, visual changes, edema.  Limited exercise.  Has cut back on salt.    Knee pain- bilateral but L > R.  Has seen [facility].  Has had cortisone injxn.   Review of Systems For ROS see HPI     Objective:   Physical Exam  Constitutional: She is oriented to person, place, and time. She appears well-developed and well-nourished. No distress.  HENT:  Head: Normocephalic and atraumatic.  Eyes: Conjunctivae and EOM are normal. Pupils are equal, round, and reactive to light.  Neck: Normal range of motion. Neck supple. No thyromegaly present.  Cardiovascular: Normal rate, regular rhythm, normal heart sounds and intact distal pulses.   No murmur heard. Pulmonary/Chest: Effort normal and breath sounds normal. No respiratory distress.  Abdominal: Soft. She exhibits no distension. There is no tenderness.  Musculoskeletal: She exhibits no edema.  Lymphadenopathy:    She has no cervical adenopathy.  Neurological: She is alert and oriented to person, place, and time.  Skin: Skin is warm and dry.  Psychiatric: She has a normal mood and affect. Her behavior is normal.  Vitals reviewed.         Assessment & Plan:

## 2015-01-14 NOTE — Patient Instructions (Signed)
Follow up in 3 months to recheck blood pressure Continue the Amlodipine, HCTZ, and atenolol Continue to work on healthy diet and regular exercise- you're doing it! Start Vit D 2000 units, Fish Oil, and Tumeric for arthritis inflammation Call [facility] for an appt for the knees Call with any questions or concerns If you want to join [place] at the new [place] office, any scheduled appointments will automatically transfer and we will see you at [address] [place] [address] [place], [place] [address] (OPENING 03/01/15) Happy Holidays!!!

## 2015-01-14 NOTE — Assessment & Plan Note (Signed)
Improved w/ increased dose of Amlodipine.  Pt to continue current medications.  Asymptomatic.  Will continue to follow at future visits.

## 2015-01-14 NOTE — Progress Notes (Signed)
Pre visit review using our clinic review tool, if applicable. No additional management support is needed unless otherwise documented below in the visit note. 

## 2015-04-22 ENCOUNTER — Encounter: Payer: Self-pay | Admitting: General Practice

## 2015-04-22 ENCOUNTER — Ambulatory Visit (INDEPENDENT_AMBULATORY_CARE_PROVIDER_SITE_OTHER): Payer: PRIVATE HEALTH INSURANCE | Admitting: Family Medicine

## 2015-04-22 ENCOUNTER — Encounter: Payer: Self-pay | Admitting: Family Medicine

## 2015-04-22 VITALS — BP 120/80 | HR 67 | Temp 98.7°F | Ht 61.0 in | Wt 199.4 lb

## 2015-04-22 DIAGNOSIS — E785 Hyperlipidemia, unspecified: Secondary | ICD-10-CM

## 2015-04-22 DIAGNOSIS — I10 Essential (primary) hypertension: Secondary | ICD-10-CM

## 2015-04-22 LAB — HEPATIC FUNCTION PANEL
ALBUMIN: 4.4 g/dL (ref 3.5–5.2)
ALK PHOS: 79 U/L (ref 39–117)
ALT: 15 U/L (ref 0–35)
AST: 18 U/L (ref 0–37)
Bilirubin, Direct: 0.1 mg/dL (ref 0.0–0.3)
TOTAL PROTEIN: 7.8 g/dL (ref 6.0–8.3)
Total Bilirubin: 0.4 mg/dL (ref 0.2–1.2)

## 2015-04-22 LAB — CBC WITH DIFFERENTIAL/PLATELET
BASOS ABS: 0 10*3/uL (ref 0.0–0.1)
Basophils Relative: 0.6 % (ref 0.0–3.0)
EOS PCT: 2 % (ref 0.0–5.0)
Eosinophils Absolute: 0.2 10*3/uL (ref 0.0–0.7)
HCT: 39.1 % (ref 36.0–46.0)
HEMOGLOBIN: 13.2 g/dL (ref 12.0–15.0)
LYMPHS ABS: 2.5 10*3/uL (ref 0.7–4.0)
Lymphocytes Relative: 32.7 % (ref 12.0–46.0)
MCHC: 33.8 g/dL (ref 30.0–36.0)
MCV: 89.5 fl (ref 78.0–100.0)
MONOS PCT: 7.1 % (ref 3.0–12.0)
Monocytes Absolute: 0.5 10*3/uL (ref 0.1–1.0)
NEUTROS PCT: 57.6 % (ref 43.0–77.0)
Neutro Abs: 4.4 10*3/uL (ref 1.4–7.7)
Platelets: 196 10*3/uL (ref 150.0–400.0)
RBC: 4.37 Mil/uL (ref 3.87–5.11)
RDW: 14.3 % (ref 11.5–15.5)
WBC: 7.6 10*3/uL (ref 4.0–10.5)

## 2015-04-22 LAB — BASIC METABOLIC PANEL
BUN: 16 mg/dL (ref 6–23)
CHLORIDE: 105 meq/L (ref 96–112)
CO2: 29 mEq/L (ref 19–32)
Calcium: 9.7 mg/dL (ref 8.4–10.5)
Creatinine, Ser: 0.68 mg/dL (ref 0.40–1.20)
GFR: 114.41 mL/min (ref >60.00)
GLUCOSE: 101 mg/dL — AB (ref 70–99)
POTASSIUM: 3.8 meq/L (ref 3.5–5.1)
Sodium: 139 mEq/L (ref 135–145)

## 2015-04-22 LAB — LIPID PANEL
CHOLESTEROL: 180 mg/dL (ref 0–200)
HDL: 62.1 mg/dL (ref >39.00)
LDL CALC: 90 mg/dL (ref 0–99)
NonHDL: 117.47
TRIGLYCERIDES: 138 mg/dL (ref 0.0–149.0)
Total CHOL/HDL Ratio: 3
VLDL: 27.6 mg/dL (ref 0.0–40.0)

## 2015-04-22 LAB — TSH: TSH: 0.76 u[IU]/mL (ref 0.35–4.50)

## 2015-04-22 NOTE — Progress Notes (Signed)
   Subjective:    Patient ID: Zyauna Domenico, female    DOB: [birth year], 60 y.o.   MRN: [id number]  HPI HTN- chronic problem, on Amlodipine, atenolol, HCTZ.  Well controlled.  No CP, SOB, HAs, visual changes, edema.  No palpitations.  Hyperlipidemia- chronic problem, on Simvastatin.  Denies abd pain, N/V, myalgias.   Review of Systems For ROS see HPI     Objective:   Physical Exam  Constitutional: She is oriented to person, place, and time. She appears well-developed and well-nourished. No distress.  HENT:  Head: Normocephalic and atraumatic.  Eyes: Conjunctivae and EOM are normal. Pupils are equal, round, and reactive to light.  Neck: Normal range of motion. Neck supple. Thyromegaly present.  Cardiovascular: Normal rate, regular rhythm, normal heart sounds and intact distal pulses.   No murmur heard. Pulmonary/Chest: Effort normal and breath sounds normal. No respiratory distress.  Abdominal: Soft. She exhibits no distension. There is no tenderness.  Musculoskeletal: She exhibits no edema.  Lymphadenopathy:    She has no cervical adenopathy.  Neurological: She is alert and oriented to person, place, and time.  Skin: Skin is warm and dry.  Psychiatric: She has a normal mood and affect. Her behavior is normal.  Vitals reviewed.         Assessment & Plan:

## 2015-04-22 NOTE — Patient Instructions (Signed)
Schedule your complete physical in 6 months We'll notify you of your lab results and make any changes if needed Continue to work on healthy diet and regular exercise- you look great! Call with any questions or concerns If you want to join [place] at the new [place] office, any scheduled appointments will automatically transfer and we will see you at [address] [place] [address], [place], [place] [address] ([place]) Enjoy the weekend!!!

## 2015-04-22 NOTE — Progress Notes (Signed)
Pre visit review using our clinic review tool, if applicable. No additional management support is needed unless otherwise documented below in the visit note. 

## 2015-04-22 NOTE — Assessment & Plan Note (Signed)
Chronic problem.  Tolerating statin w/o difficulty.  Check labs.  Adjust meds prn  

## 2015-04-22 NOTE — Assessment & Plan Note (Signed)
Chronic problem for pt.  Well controlled today.  Asymptomatic.  Check labs.  No anticipated med changes.

## 2015-07-06 ENCOUNTER — Ambulatory Visit (INDEPENDENT_AMBULATORY_CARE_PROVIDER_SITE_OTHER): Payer: PRIVATE HEALTH INSURANCE | Admitting: Family Medicine

## 2015-07-06 ENCOUNTER — Encounter: Payer: Self-pay | Admitting: Family Medicine

## 2015-07-06 VITALS — BP 129/90 | HR 80 | Temp 98.0°F | Resp 16 | Wt 196.0 lb

## 2015-07-06 DIAGNOSIS — L03211 Cellulitis of face: Secondary | ICD-10-CM

## 2015-07-06 DIAGNOSIS — L259 Unspecified contact dermatitis, unspecified cause: Secondary | ICD-10-CM | POA: Diagnosis not present

## 2015-07-06 DIAGNOSIS — L0390 Cellulitis, unspecified: Secondary | ICD-10-CM | POA: Insufficient documentation

## 2015-07-06 MED ORDER — CEPHALEXIN 500 MG PO CAPS: 500.0000 mg | ORAL_CAPSULE | Freq: Three times a day (TID) | ORAL | Status: DC

## 2015-07-06 MED ORDER — CETIRIZINE HCL 10 MG PO TABS: 10.0000 mg | ORAL_TABLET | Freq: Every day | ORAL | Status: DC

## 2015-07-06 NOTE — Progress Notes (Signed)
Pre visit review using our clinic review tool, if applicable. No additional management support is needed unless otherwise documented below in the visit note. 

## 2015-07-06 NOTE — Patient Instructions (Signed)
Follow up as needed Start the Keflex 3x/day to prevent infection from spreading around the eye- take w/ food Continue the hydrocortisone on the area as needed for itching Start Zyrtec once daily- I sent a prescription to the pharmacy so please compare which is cheaper (OTC vs prescription) Apply warm or cool compresses- whichever feels better- to the area to improve swelling If increased pain, swelling, redness, visual changes or other concerns CALL ME! Call with any questions or concerns Hang in there!!!

## 2015-07-06 NOTE — Assessment & Plan Note (Signed)
New.  Start Keflex.  Reviewed supportive care and red flags that should prompt return.  Pt expressed understanding and is in agreement w/ plan.

## 2015-07-06 NOTE — Assessment & Plan Note (Signed)
New.  Small area just lateral to R eye but swelling is impressive.  Continue topical hydrocortisone cream.  Restart daily antihistamine.  Warm or cool compresses to improve swelling.  Due to mild warm and worsening swelling will start Keflex in case there is some degree of cellulitis.  Reviewed supportive care and red flags that should prompt return.  Pt expressed understanding and is in agreement w/ plan.

## 2015-07-06 NOTE — Progress Notes (Signed)
   Subjective:    Patient ID: Shalona Smyers, female    DOB: [birth year], 60 y.o.   MRN: [id number]  HPI Facial swelling- sxs started a few days ago on R upper eyelid and just lateral to R eye.  Some stinging and itching but not painful.  Swelling is worsening- now has edema of area under R eye.  No known bite or break in the skin.  Had worked in the yard prior to [employer] starting.  No fevers.  No visual changes.     Review of Systems For ROS see HPI     Objective:   Physical Exam  Constitutional: She is oriented to person, place, and time. She appears well-developed and well-nourished. No distress.  HENT:  Head: Normocephalic and atraumatic.  Eyes: Conjunctivae and EOM are normal. Pupils are equal, round, and reactive to light.  No TTP over R lower or upper lid  Neurological: She is alert and oriented to person, place, and time.  Skin: Skin is warm and dry. There is erythema (mild erythema along R lateral eye w/ swelling that extends to upper lid and into space below eye (bags).  mildly warm to touch).  Vitals reviewed.         Assessment & Plan:

## 2015-10-21 ENCOUNTER — Encounter: Payer: Self-pay | Admitting: Family Medicine

## 2015-10-21 ENCOUNTER — Ambulatory Visit (INDEPENDENT_AMBULATORY_CARE_PROVIDER_SITE_OTHER): Payer: PRIVATE HEALTH INSURANCE | Admitting: Family Medicine

## 2015-10-21 VITALS — BP 126/88 | HR 66 | Temp 98.1°F | Resp 16 | Ht 61.0 in | Wt 192.1 lb

## 2015-10-21 DIAGNOSIS — Z0000 Encounter for general adult medical examination without abnormal findings: Secondary | ICD-10-CM

## 2015-10-21 LAB — CBC WITH DIFFERENTIAL/PLATELET
Basophils Absolute: 0 10*3/uL (ref 0.0–0.1)
Basophils Relative: 0.6 % (ref 0.0–3.0)
EOS PCT: 0.8 % (ref 0.0–5.0)
Eosinophils Absolute: 0.1 10*3/uL (ref 0.0–0.7)
HCT: 42.1 % (ref 36.0–46.0)
Hemoglobin: 14.2 g/dL (ref 12.0–15.0)
LYMPHS ABS: 2.1 10*3/uL (ref 0.7–4.0)
Lymphocytes Relative: 32.1 % (ref 12.0–46.0)
MCHC: 33.8 g/dL (ref 30.0–36.0)
MCV: 90.1 fl (ref 78.0–100.0)
MONOS PCT: 5.6 % (ref 3.0–12.0)
Monocytes Absolute: 0.4 10*3/uL (ref 0.1–1.0)
NEUTROS ABS: 4 10*3/uL (ref 1.4–7.7)
NEUTROS PCT: 60.9 % (ref 43.0–77.0)
PLATELETS: 170 10*3/uL (ref 150.0–400.0)
RBC: 4.67 Mil/uL (ref 3.87–5.11)
RDW: 14.9 % (ref 11.5–15.5)
WBC: 6.5 10*3/uL (ref 4.0–10.5)

## 2015-10-21 LAB — HEPATIC FUNCTION PANEL
ALK PHOS: 74 U/L (ref 39–117)
ALT: 23 U/L (ref 0–35)
AST: 25 U/L (ref 0–37)
Albumin: 4.6 g/dL (ref 3.5–5.2)
BILIRUBIN DIRECT: 0.1 mg/dL (ref 0.0–0.3)
BILIRUBIN TOTAL: 0.7 mg/dL (ref 0.2–1.2)
Total Protein: 7.5 g/dL (ref 6.0–8.3)

## 2015-10-21 LAB — LIPID PANEL
CHOL/HDL RATIO: 2
CHOLESTEROL: 184 mg/dL (ref 0–200)
HDL: 73.7 mg/dL (ref >39.00)
LDL Cholesterol: 96 mg/dL (ref 0–99)
NonHDL: 110.5
TRIGLYCERIDES: 74 mg/dL (ref 0.0–149.0)
VLDL: 14.8 mg/dL (ref 0.0–40.0)

## 2015-10-21 LAB — BASIC METABOLIC PANEL
BUN: 14 mg/dL (ref 6–23)
CHLORIDE: 104 meq/L (ref 96–112)
CO2: 28 mEq/L (ref 19–32)
Calcium: 9.4 mg/dL (ref 8.4–10.5)
Creatinine, Ser: 0.7 mg/dL (ref 0.40–1.20)
GFR: 110.46 mL/min (ref >60.00)
Glucose, Bld: 102 mg/dL — ABNORMAL HIGH (ref 70–99)
POTASSIUM: 4.2 meq/L (ref 3.5–5.1)
Sodium: 141 mEq/L (ref 135–145)

## 2015-10-21 LAB — TSH: TSH: 0.68 u[IU]/mL (ref 0.35–4.50)

## 2015-10-21 LAB — VITAMIN D 25 HYDROXY (VIT D DEFICIENCY, FRACTURES): VITD: 12.43 ng/mL — ABNORMAL LOW (ref 30.00–100.00)

## 2015-10-21 MED ORDER — ATENOLOL 50 MG PO TABS: 100.0000 mg | ORAL_TABLET | Freq: Every day | ORAL | 1 refills | Status: DC

## 2015-10-21 MED ORDER — AMLODIPINE BESYLATE 10 MG PO TABS: 10.0000 mg | ORAL_TABLET | Freq: Every day | ORAL | 1 refills | Status: DC

## 2015-10-21 MED ORDER — SIMVASTATIN 20 MG PO TABS: 20.0000 mg | ORAL_TABLET | Freq: Every day | ORAL | 1 refills | Status: DC

## 2015-10-21 MED ORDER — HYDROCHLOROTHIAZIDE 25 MG PO TABS: 25.0000 mg | ORAL_TABLET | Freq: Every day | ORAL | 1 refills | Status: DC

## 2015-10-21 MED ORDER — LEVOTHYROXINE SODIUM 50 MCG PO TABS: 25.0000 ug | ORAL_TABLET | Freq: Every day | ORAL | 1 refills | Status: DC

## 2015-10-21 NOTE — Progress Notes (Signed)
   Subjective:    Patient ID: Jamie Kelley, female    DOB: Jun 19, 1955, 60 y.o.   MRN: [id number]  HPI CPE- UTD on mammo (due in October), due for pap.  Due for colonoscopy- pt can't recall when she had her last one.  Is interested in Cologuard but is going to check insurance coverage first.   Review of Systems Patient reports no vision/ hearing changes, adenopathy,fever, weight change,  persistant/recurrent hoarseness , swallowing issues, chest pain, palpitations, edema, persistant/recurrent cough, hemoptysis, dyspnea (rest/exertional/paroxysmal nocturnal), gastrointestinal bleeding (melena, rectal bleeding), abdominal pain, significant heartburn, bowel changes, GU symptoms (dysuria, hematuria, incontinence), Gyn symptoms (abnormal  bleeding, pain),  syncope, focal weakness, memory loss, numbness & tingling, skin/hair/nail changes, abnormal bruising or bleeding, anxiety, or depression.     Objective:   Physical Exam General Appearance:    Alert, cooperative, no distress, appears stated age  Head:    Normocephalic, without obvious abnormality, atraumatic  Eyes:    PERRL, conjunctiva/corneas clear, EOM's intact, fundi    benign, both eyes  Ears:    Normal TM's and external ear canals, both ears  Nose:   Nares normal, septum midline, mucosa normal, no drainage    or sinus tenderness  Throat:   Lips, mucosa, and tongue normal; teeth and gums normal  Neck:   Supple, symmetrical, trachea midline, no adenopathy;    Thyroid: diffuse thyromegaly  Back:     Symmetric, no curvature, ROM normal, no CVA tenderness  Lungs:     Clear to auscultation bilaterally, respirations unlabored  Chest Wall:    No tenderness or deformity   Heart:    Regular rate and rhythm, S1 and S2 normal, no murmur, rub   or gallop  Breast Exam:    Deferred to mammo  Abdomen:     Soft, non-tender, bowel sounds active all four quadrants,    no masses, no organomegaly  Genitalia:    Deferred  Rectal:    Extremities:    Extremities normal, atraumatic, no cyanosis or edema  Pulses:   2+ and symmetric all extremities  Skin:   Skin color, texture, turgor normal, no rashes or lesions  Lymph nodes:   Cervical, supraclavicular, and axillary nodes normal  Neurologic:   CNII-XII intact, normal strength, sensation and reflexes    throughout          Assessment & Plan:  CPE- Pt is UTD on mammo, due for colonoscopy (she prefers cologuard and plans to check w/ insurance).  Pt to schedule pap at her convenience as she doesn't want to have one today.  Discussed need for healthy diet and regular exercise.  Check labs.  Anticipatory guidance provided.

## 2015-10-21 NOTE — Progress Notes (Signed)
Pre visit review using our clinic review tool, if applicable. No additional management support is needed unless otherwise documented below in the visit note. 

## 2015-10-21 NOTE — Patient Instructions (Signed)
Schedule your pap at your convenience We'll notify you of your lab results and make any changes if needed Please ask insurance about Cologuard and let us know- we will send the kit when you tell me! You are due for your mammo in October Try and work on healthy diet and regular exercise- you look great!!! Call with any questions or concerns Happy Labor Day!!!  

## 2015-10-24 ENCOUNTER — Other Ambulatory Visit: Payer: Self-pay | Admitting: General Practice

## 2015-10-24 MED ORDER — VITAMIN D (ERGOCALCIFEROL) 1.25 MG (50000 UNIT) PO CAPS: 50000.0000 [IU] | ORAL_CAPSULE | ORAL | 0 refills | Status: DC

## 2015-12-02 ENCOUNTER — Other Ambulatory Visit: Payer: Self-pay | Admitting: Family Medicine

## 2015-12-02 DIAGNOSIS — Z1231 Encounter for screening mammogram for malignant neoplasm of breast: Secondary | ICD-10-CM

## 2015-12-19 ENCOUNTER — Ambulatory Visit
Admission: RE | Admit: 2015-12-19 | Discharge: 2015-12-19 | Disposition: A | Discharge status: Home / Self Care | Payer: PRIVATE HEALTH INSURANCE | Source: Ambulatory Visit | Attending: Family Medicine | Admitting: Family Medicine

## 2015-12-19 DIAGNOSIS — Z1231 Encounter for screening mammogram for malignant neoplasm of breast: Secondary | ICD-10-CM | POA: Diagnosis not present

## 2016-03-09 ENCOUNTER — Ambulatory Visit: Payer: Self-pay | Admitting: *Deleted

## 2016-03-09 DIAGNOSIS — Z23 Encounter for immunization: Secondary | ICD-10-CM

## 2016-05-01 ENCOUNTER — Encounter: Payer: Self-pay | Admitting: Registered Nurse

## 2016-05-01 ENCOUNTER — Ambulatory Visit: Payer: Self-pay | Admitting: Registered Nurse

## 2016-05-01 VITALS — BP 142/84 | HR 63 | Temp 97.8°F

## 2016-05-01 DIAGNOSIS — J0111 Acute recurrent frontal sinusitis: Secondary | ICD-10-CM

## 2016-05-01 DIAGNOSIS — J301 Allergic rhinitis due to pollen: Secondary | ICD-10-CM

## 2016-05-01 DIAGNOSIS — J0101 Acute recurrent maxillary sinusitis: Secondary | ICD-10-CM

## 2016-05-01 NOTE — Progress Notes (Signed)
Subjective:    Patient ID: Jamie Kelley, female    DOB: 20-Sep-1955, 61 y.o.   MRN: [id number]  60y/o african [place] female here for evaluation headache, sinus pain/pressure, ear pain x 5 days.  Has tried allegra, claritin, flonase, motrin without any relief.  Her PCM switched her from allegra to zyrtec but she doesn't think it is working any better.  On flonase x 3 days.  Doing nasal rinse with tap water.  Last sinus infection Nov 2017 azithromycin worked well for her doesn't tolerate amoxicillin GI upset.      Review of Systems  Constitutional: Positive for fatigue. Negative for activity change, appetite change, chills, diaphoresis, fever and unexpected weight change.  HENT: Positive for congestion, ear pain, postnasal drip, rhinorrhea, sinus pain and sinus pressure. Negative for dental problem, drooling, ear discharge, facial swelling, hearing loss, mouth sores, nosebleeds, sneezing, sore throat, tinnitus, trouble swallowing and voice change.   Eyes: Negative for photophobia, pain, discharge, redness, itching and visual disturbance.  Respiratory: Negative for cough, choking, chest tightness, shortness of breath, wheezing and stridor.   Cardiovascular: Negative for chest pain, palpitations and leg swelling.  Gastrointestinal: Negative for abdominal distention, abdominal pain, blood in stool, constipation, diarrhea, nausea and vomiting.  Endocrine: Negative for cold intolerance and heat intolerance.  Genitourinary: Negative for difficulty urinating, dysuria and hematuria.  Musculoskeletal: Negative for arthralgias, back pain, gait problem, joint swelling, myalgias, neck pain and neck stiffness.  Skin: Negative for color change, pallor, rash and wound.  Allergic/Immunologic: Positive for environmental allergies. Negative for food allergies.  Neurological: Positive for headaches. Negative for dizziness, tremors, seizures, syncope, facial asymmetry, speech difficulty, weakness, light-headedness  and numbness.  Hematological: Negative for adenopathy. Does not bruise/bleed easily.  Psychiatric/Behavioral: Negative for agitation, behavioral problems, confusion and sleep disturbance.       Objective:   Physical Exam  Constitutional: She is oriented to person, place, and time. She appears well-developed and well-nourished. She is active and cooperative.  Non-toxic appearance. She does not have a sickly appearance. She appears ill. No distress.  HENT:  Head: Normocephalic and atraumatic.  Right Ear: Hearing, external ear and ear canal normal. A middle ear effusion is present.  Left Ear: Hearing, external ear and ear canal normal. A middle ear effusion is present.  Nose: Mucosal edema and rhinorrhea present. No nose lacerations, sinus tenderness, nasal deformity, septal deviation or nasal septal hematoma. No epistaxis.  No foreign bodies. Right sinus exhibits maxillary sinus tenderness and frontal sinus tenderness. Left sinus exhibits maxillary sinus tenderness and frontal sinus tenderness.  Mouth/Throat: Uvula is midline and mucous membranes are normal. Mucous membranes are not pale, not dry and not cyanotic. She does not have dentures. No oral lesions. No trismus in the jaw. Normal dentition. No dental abscesses, uvula swelling, lacerations or dental caries. Posterior oropharyngeal edema and posterior oropharyngeal erythema present. No oropharyngeal exudate or tonsillar abscesses.  Cobblestoning posterior pharynx; bilateral TMs air fluid level clear; bilateral allergic shiners; bilateral nasal turbinates edema/erythema clear discharge; sinuses equally tender frontal/maxillary/left and right  Eyes: Conjunctivae, EOM and lids are normal. Pupils are equal, round, and reactive to light. Right eye exhibits no chemosis, no discharge, no exudate and no hordeolum. No foreign body present in the right eye. Left eye exhibits no chemosis, no discharge, no exudate and no hordeolum. No foreign body present in  the left eye. Right conjunctiva is not injected. Right conjunctiva has no hemorrhage. Left conjunctiva is not injected. Left conjunctiva has no hemorrhage. No scleral  icterus. Right eye exhibits normal extraocular motion and no nystagmus. Left eye exhibits normal extraocular motion and no nystagmus. Right pupil is round and reactive. Left pupil is round and reactive. Pupils are equal.  Neck: Trachea normal and normal range of motion. Neck supple. No tracheal tenderness, no spinous process tenderness and no muscular tenderness present. No neck rigidity. No tracheal deviation, no edema, no erythema and normal range of motion present. No thyroid mass and no thyromegaly present.  Cardiovascular: Normal rate, regular rhythm, S1 normal, S2 normal, normal heart sounds and intact distal pulses.  PMI is not displaced.  Exam reveals no gallop and no friction rub.   No murmur heard. Pulmonary/Chest: Effort normal and breath sounds normal. No accessory muscle usage or stridor. No respiratory distress. She has no decreased breath sounds. She has no wheezes. She has no rhonchi. She has no rales. She exhibits no tenderness.  Speaks full sentences without difficulty; cough not observed in exam room  Abdominal: Soft. She exhibits no distension.  Musculoskeletal: Normal range of motion. She exhibits no edema or tenderness.       Right shoulder: Normal.       Left shoulder: Normal.       Right hip: Normal.       Left hip: Normal.       Right knee: Normal.       Left knee: Normal.       Cervical back: Normal.       Right hand: Normal.       Left hand: Normal.  Lymphadenopathy:       Head (right side): No submental, no submandibular, no tonsillar, no preauricular, no posterior auricular and no occipital adenopathy present.       Head (left side): No submental, no submandibular, no tonsillar, no preauricular, no posterior auricular and no occipital adenopathy present.    She has no cervical adenopathy.       Right  cervical: No superficial cervical, no deep cervical and no posterior cervical adenopathy present.      Left cervical: No superficial cervical, no deep cervical and no posterior cervical adenopathy present.  Neurological: She is alert and oriented to person, place, and time. She has normal strength. She is not disoriented. She displays no atrophy and no tremor. No cranial nerve deficit or sensory deficit. She exhibits normal muscle tone. She displays no seizure activity. Coordination and gait normal. GCS eye subscore is 4. GCS verbal subscore is 5. GCS motor subscore is 6.  Skin: Skin is warm, dry and intact. No abrasion, no bruising, no burn, no ecchymosis, no laceration, no lesion, no petechiae and no rash noted. She is not diaphoretic. No cyanosis or erythema. No pallor. Nails show no clubbing.  Psychiatric: She has a normal mood and affect. Her speech is normal and behavior is normal. Judgment and thought content normal. Cognition and memory are normal.  Nursing note and vitals reviewed.         Assessment & Plan:  A-acute recurrent maxillary and frontal sinusitis; seasonal allergic rhinitis  P- continue flonase 1 spray each nostril BID; start nasal saline 2 sprays each nostril q2h wa and shower bid given 1 UD bottle from clinic stock; azithromycin 500mg  po today then 250mg  po daily days 2-5.  Tylenol 1000mg  po QID prn given 4 UD from clinic stock.  Discussed motrin and illness can increase blood pressure.  Tylenol does not affect blood pressure.  Hydrate.  Discussed pick either zyrtec 10mg  po BID or  allegra 180mg  po daily and stick to one formulation to see if helps with flonase/saline/shower BID.  If no relief will order singulair 10mg  po qhs #30 RF0 for patient.   Patient may use normal saline nasal spray as needed.  Consider antihistamine or nasal steroid use.  Avoid triggers if possible.  Shower prior to bedtime if exposed to triggers.  If allergic dust/dust mites recommend mattress/pillow  covers/encasements; washing linens, vacuuming, sweeping, dusting weekly.  Call or return to clinic as needed if these symptoms worsen or fail to improve as anticipated.     Patient verbalized understanding of instructions, agreed with plan of care and had no further questions at this time.  P2:  Avoidance and hand washing.  No evidence of systemic bacterial infection, non toxic and well hydrated.  I do not see where any further testing or imaging is necessary at this time.   I will suggest supportive care, rest, good hygiene and encourage the patient to take adequate fluids.  The patient is to return to clinic or EMERGENCY ROOM if symptoms worsen or change significantly.  Patient verbalized agreement and understanding of treatment plan and had no further questions at this time.   P2:  Hand washing and cover cough

## 2016-05-01 NOTE — Progress Notes (Signed)
Pt c/o sinus pain/pressure, pressure behind eyes, bilateral ear pain, HA x5 days. Denies ear or eye drainage. Denies sore throat. Mild non-prod cough. Using Allegra daily. No other OTCs.

## 2016-06-17 ENCOUNTER — Encounter (HOSPITAL_COMMUNITY): Payer: Self-pay | Admitting: Emergency Medicine

## 2016-06-17 ENCOUNTER — Ambulatory Visit (HOSPITAL_COMMUNITY)
Admission: EM | Admit: 2016-06-17 | Discharge: 2016-06-17 | Disposition: A | Discharge status: Home / Self Care | Payer: PRIVATE HEALTH INSURANCE | Attending: Family Medicine | Admitting: Family Medicine

## 2016-06-17 DIAGNOSIS — S39012A Strain of muscle, fascia and tendon of lower back, initial encounter: Secondary | ICD-10-CM | POA: Diagnosis not present

## 2016-06-17 MED ORDER — NAPROXEN 250 MG PO TABS: 250.0000 mg | ORAL_TABLET | Freq: Two times a day (BID) | ORAL | 0 refills | Status: DC

## 2016-06-17 MED ORDER — CYCLOBENZAPRINE HCL 5 MG PO TABS: 5.0000 mg | ORAL_TABLET | Freq: Three times a day (TID) | ORAL | 0 refills | Status: DC | PRN

## 2016-06-17 NOTE — ED Provider Notes (Signed)
CSN: [id number]     Arrival date & time 06/17/16  1325 History   First MD Initiated Contact with Patient 06/17/16 1413     Chief Complaint  Patient presents with  . Back Pain   (Consider location/radiation/quality/duration/timing/severity/associated sxs/prior Treatment) Patient c/o low back pain for 3 days.  She states she was helping lift her father a week ago.   The history is provided by the patient.  Back Pain  Location:  Lumbar spine Quality:  Aching Pain severity:  Mild Pain is:  Worse during the day Onset quality:  Sudden Timing:  Constant Chronicity:  New Relieved by:  Nothing Worsened by:  Nothing Ineffective treatments:  None tried   Past Medical History:  Diagnosis Date  . Hay fever   . High blood pressure   . Hyperlipidemia   . Obesity   . Thyroid disease    Past Surgical History:  Procedure Laterality Date  . BIOPSY THYROID     Dr Constance Holster- negative for cancer   Family History  Problem Relation Age of Onset  . Heart disease Mother   . Hypertension Mother   . Diabetes Mother   . Alzheimer's disease Mother   . Cancer Maternal Aunt     breast  . Heart disease Maternal Aunt   . Hypertension Maternal Aunt   . Breast cancer Maternal Aunt 70  . Hypertension Maternal Uncle   . Heart disease Maternal Uncle   . Heart disease Maternal Grandmother   . Hypertension Maternal Grandmother   . Diabetes Maternal Grandmother    Social History  Substance Use Topics  . Smoking status: Never Smoker  . Smokeless tobacco: Never Used  . Alcohol use No   OB History    No data available     Review of Systems  Constitutional: Negative.   HENT: Negative.   Eyes: Negative.   Respiratory: Negative.   Cardiovascular: Negative.   Gastrointestinal: Negative.   Endocrine: Negative.   Genitourinary: Negative.   Musculoskeletal: Positive for back pain.  Allergic/Immunologic: Negative.   Neurological: Negative.   Hematological: Negative.   Psychiatric/Behavioral:  Negative.     Allergies  Patient has no known allergies.  Home Medications   Prior to Admission medications   Medication Sig Start Date End Date Taking? Authorizing Provider  ibuprofen (ADVIL,MOTRIN) 200 MG tablet Take 200 mg by mouth every 6 (six) hours as needed.   Yes Historical Provider, MD  albuterol (PROVENTIL HFA;VENTOLIN HFA) 108 (90 Base) MCG/ACT inhaler Inhale into the lungs every 6 (six) hours as needed for wheezing or shortness of breath.    Historical Provider, MD  amLODipine (NORVASC) 10 MG tablet Take 1 tablet (10 mg total) by mouth daily. 10/21/15   Midge Minium, MD  atenolol (TENORMIN) 50 MG tablet Take 2 tablets (100 mg total) by mouth daily. 10/21/15   Midge Minium, MD  cetirizine (ZYRTEC) 10 MG tablet Take 1 tablet (10 mg total) by mouth daily. 07/06/15   Midge Minium, MD  cyclobenzaprine (FLEXERIL) 5 MG tablet Take 1 tablet (5 mg total) by mouth 3 (three) times daily as needed for muscle spasms. 06/17/16   Lysbeth Penner, FNP  fexofenadine (ALLEGRA) 60 MG tablet Take 60 mg by mouth daily.    Historical Provider, MD  hydrochlorothiazide (HYDRODIURIL) 25 MG tablet Take 1 tablet (25 mg total) by mouth daily. 10/21/15   Midge Minium, MD  levothyroxine (SYNTHROID, LEVOTHROID) 50 MCG tablet Take 0.5 tablets (25 mcg total) by mouth daily  before breakfast. 10/21/15   Midge Minium, MD  naproxen (NAPROSYN) 250 MG tablet Take 1 tablet (250 mg total) by mouth 2 (two) times daily with a meal. 06/17/16   Lysbeth Penner, FNP  simvastatin (ZOCOR) 20 MG tablet Take 1 tablet (20 mg total) by mouth at bedtime. 10/21/15   Midge Minium, MD  Vitamin D, Ergocalciferol, (DRISDOL) 50000 units CAPS capsule Take 1 capsule (50,000 Units total) by mouth every 7 (seven) days. Patient not taking: Reported on 05/01/2016 10/24/15   Midge Minium, MD   Meds Ordered and Administered this Visit  Medications - No data to display  BP (!) 155/86 (BP Location: Left Arm)    Pulse 72   Temp 98.2 F (36.8 C) (Oral)   Resp 16   LMP 03/10/2012   SpO2 100%  No data found.   Physical Exam  Constitutional: She appears well-developed and well-nourished.  HENT:  Head: Normocephalic and atraumatic.  Right Ear: External ear normal.  Left Ear: External ear normal.  Mouth/Throat: Oropharynx is clear and moist.  Eyes: Conjunctivae and EOM are normal. Pupils are equal, round, and reactive to light.  Neck: Normal range of motion. Neck supple.  Cardiovascular: Normal rate, regular rhythm and normal heart sounds.   Pulmonary/Chest: Effort normal.  Musculoskeletal: She exhibits tenderness.  TTP left lumbar paraspinous muscles  Nursing note and vitals reviewed.   Urgent Care Course     Procedures (including critical care time)  Labs Review Labs Reviewed - No data to display  Imaging Review No results found.   Visual Acuity Review  Right Eye Distance:   Left Eye Distance:   Bilateral Distance:    Right Eye Near:   Left Eye Near:    Bilateral Near:         MDM   1. Strain of lumbar region, initial encounter    Naprosyn 250 mg one po bid x 7 days #14     Lysbeth Penner, FNP 06/17/16 1431

## 2016-06-17 NOTE — ED Triage Notes (Signed)
Reports lower back pain on Thursday.  No known injury.  3 days prior did assist her father off the floor from a fall, not sure if related.

## 2016-08-11 ENCOUNTER — Encounter (HOSPITAL_COMMUNITY): Payer: Self-pay | Admitting: Emergency Medicine

## 2016-08-11 ENCOUNTER — Ambulatory Visit (HOSPITAL_COMMUNITY)
Admission: EM | Admit: 2016-08-11 | Discharge: 2016-08-11 | Disposition: A | Discharge status: Home / Self Care | Payer: PRIVATE HEALTH INSURANCE | Attending: Family Medicine | Admitting: Family Medicine

## 2016-08-11 DIAGNOSIS — R059 Cough, unspecified: Secondary | ICD-10-CM

## 2016-08-11 DIAGNOSIS — J40 Bronchitis, not specified as acute or chronic: Secondary | ICD-10-CM

## 2016-08-11 DIAGNOSIS — R05 Cough: Secondary | ICD-10-CM

## 2016-08-11 MED ORDER — PROMETHAZINE-CODEINE 6.25-10 MG/5ML PO SYRP: 5.0000 mL | ORAL_SOLUTION | Freq: Four times a day (QID) | ORAL | 0 refills | Status: DC | PRN

## 2016-08-11 MED ORDER — AZITHROMYCIN 250 MG PO TABS: 250.0000 mg | ORAL_TABLET | Freq: Every day | ORAL | 0 refills | Status: DC

## 2016-08-11 NOTE — ED Triage Notes (Signed)
Pt here for allegies/cold sx onset 1 week associated w/bilateral ear fullness, ST, HA, nasal drainage/congestion, prod cough  Denies fevers  Taking OTC Allegra, Mucinex w/no relief.  A&O x4... NAD... Ambulatory

## 2016-08-11 NOTE — ED Provider Notes (Signed)
[id number]  08/11/16 1158  ASSESSMENT & PLAN:  Final diagnoses:  Bronchitis  Cough   Given duration and not feeling better will treat.  Meds ordered this encounter  Medications  . promethazine-codeine (PHENERGAN WITH CODEINE) 6.25-10 MG/5ML syrup    Sig: Take 5 mLs by mouth every 6 (six) hours as needed for cough.    Dispense:  90 mL    Refill:  0  . azithromycin (ZITHROMAX) 250 MG tablet    Sig: Take 1 tablet (250 mg total) by mouth daily. Take first 2 tablets together, then 1 every day until finished.    Dispense:  6 tablet    Refill:  0   Medication sedation precautions.  Noticed slightly increased BP in office. Will f/u with PCP for monitoring.   Reviewed expectations re: course of current medical issues.  Discussed self-management of symptoms.  Outlined signs and symptoms indicating need for more acute intervention.  Patient verbalized understanding. Questions answered.  After Visit Summary given.   SUBJECTIVE:  Jamie Kelley is a 61 y.o. female who presents with complaint of not feeling well for one week. Persistent dry cough. No SOB, wheezing. No improvement. Afebrile. No n/v. Decreased sleep secondary to coughing. Mild head congestion. OTC without relief.  Non-smoker. Positive passive smoke exposure.  OBJECTIVE: Vitals:   08/11/16 1217  BP: (!) 146/77  Pulse: 66  Resp: 18  Temp: 99.1 F (37.3 C)   Vitals:   08/11/16 1217  BP: (!) 146/77  Pulse: 66  Resp: 18  Temp: 99.1 F (37.3 C)  TempSrc: Oral  SpO2: 97%     BP (!) 146/77 (BP Location: Left Arm)   Pulse 66   Temp 99.1 F (37.3 C) (Oral)   Resp 18   LMP 03/10/2012   SpO2 97%  General appearance: alert Head: Normocephalic, without obvious abnormality, atraumatic Ears: normal TM's and external ear canals both ears Nose: Nares normal. Septum midline. Mucosa normal. No drainage or sinus tenderness., moderate nasal congestion Throat: lips, mucosa, and tongue normal; teeth and gums  normal Neck: no adenopathy, no carotid bruit and supple, symmetrical, trachea midline Lungs: clear to auscultation bilaterally and significant coughing during exam - dry Heart: regular rate and rhythm Skin: warm and dry   Labs Reviewed - No data to display  Patient has no known allergies.  PMHx, SurgHx, SocialHx, Medications, and Allergies were reviewed in the Visit Navigator and updated as appropriate.   Prior to Admission medications   Medication Sig Start Date End Date Taking? Authorizing Provider  albuterol (PROVENTIL HFA;VENTOLIN HFA) 108 (90 Base) MCG/ACT inhaler Inhale into the lungs every 6 (six) hours as needed for wheezing or shortness of breath.   Yes [provider]  amLODipine (NORVASC) 10 MG tablet Take 1 tablet (10 mg total) by mouth daily. 10/21/15  Yes Midge Minium, MD  atenolol (TENORMIN) 50 MG tablet Take 2 tablets (100 mg total) by mouth daily. 10/21/15  Yes Midge Minium, MD  fexofenadine (ALLEGRA) 60 MG tablet Take 60 mg by mouth daily.   Yes [provider]  hydrochlorothiazide (HYDRODIURIL) 25 MG tablet Take 1 tablet (25 mg total) by mouth daily. 10/21/15  Yes Midge Minium, MD  levothyroxine (SYNTHROID, LEVOTHROID) 50 MCG tablet Take 0.5 tablets (25 mcg total) by mouth daily before breakfast. 10/21/15  Yes Midge Minium, MD  azithromycin (ZITHROMAX) 250 MG tablet Take 1 tablet (250 mg total) by mouth daily. Take first 2 tablets together, then 1 every day until finished.  08/11/16   Vanessa Kick, MD  cetirizine (ZYRTEC) 10 MG tablet Take 1 tablet (10 mg total) by mouth daily. 07/06/15   Midge Minium, MD  cyclobenzaprine (FLEXERIL) 5 MG tablet Take 1 tablet (5 mg total) by mouth 3 (three) times daily as needed for muscle spasms. 06/17/16   Lysbeth Penner, FNP  ibuprofen (ADVIL,MOTRIN) 200 MG tablet Take 200 mg by mouth every 6 (six) hours as needed.    [provider]  naproxen (NAPROSYN) 250 MG tablet Take 1  tablet (250 mg total) by mouth 2 (two) times daily with a meal. 06/17/16   [place], Orson Ape, FNP  promethazine-codeine (PHENERGAN WITH CODEINE) 6.25-10 MG/5ML syrup Take 5 mLs by mouth every 6 (six) hours as needed for cough. 08/11/16   Vanessa Kick, MD  simvastatin (ZOCOR) 20 MG tablet Take 1 tablet (20 mg total) by mouth at bedtime. 10/21/15   Midge Minium, MD  Vitamin D, Ergocalciferol, (DRISDOL) 50000 units CAPS capsule Take 1 capsule (50,000 Units total) by mouth every 7 (seven) days. Patient not taking: Reported on 05/01/2016 10/24/15   Midge Minium, MD         Vanessa Kick, MD 08/11/16 1257

## 2016-08-20 ENCOUNTER — Ambulatory Visit: Payer: Self-pay | Admitting: Registered Nurse

## 2016-08-20 VITALS — BP 146/86 | HR 66 | Temp 97.7°F

## 2016-08-20 DIAGNOSIS — H1013 Acute atopic conjunctivitis, bilateral: Secondary | ICD-10-CM

## 2016-08-20 DIAGNOSIS — H6121 Impacted cerumen, right ear: Secondary | ICD-10-CM

## 2016-08-20 DIAGNOSIS — J019 Acute sinusitis, unspecified: Secondary | ICD-10-CM

## 2016-08-20 DIAGNOSIS — H6591 Unspecified nonsuppurative otitis media, right ear: Secondary | ICD-10-CM

## 2016-08-20 DIAGNOSIS — J309 Allergic rhinitis, unspecified: Principal | ICD-10-CM

## 2016-08-20 DIAGNOSIS — H6592 Unspecified nonsuppurative otitis media, left ear: Secondary | ICD-10-CM

## 2016-08-20 MED ORDER — CARBOXYMETHYLCELLULOSE SODIUM 1 % OP SOLN: 1.0000 [drp] | Freq: Three times a day (TID) | OPHTHALMIC | 12 refills | Status: DC | PRN

## 2016-08-20 MED ORDER — SALINE SPRAY 0.65 % NA SOLN: 2.0000 | NASAL | 0 refills | Status: DC

## 2016-08-20 MED ORDER — FLUTICASONE PROPIONATE 50 MCG/ACT NA SUSP: 1.0000 | Freq: Two times a day (BID) | NASAL | 0 refills | Status: DC

## 2016-08-20 MED ORDER — AZITHROMYCIN 250 MG PO TABS: ORAL_TABLET | ORAL | 0 refills | Status: DC

## 2016-08-20 NOTE — Progress Notes (Signed)
Subjective:    Patient ID: Jamie Kelley, female    DOB: 1955/12/31, 61 y.o.   MRN: [id number]  61y/o african [place] female established patient reports R eye swelling and itchiness since yesterday. Clear drainage. Eye not matting shut. Non-painful, just irritated at inner corner of eye per pt. Right ear discomfort.  Some post nasal drip PMHx spring seasonal allergic rhinitis not using flonase or nasal saline right now. Sat outside on break this am and symptoms worsened.  Hasn't been taking allegra daily just prn every couple of days.  Was treated for bronchitis at urgent care two weeks ago finished azithromycin and Rx cough medicine.  Reported cough resolved.  Denied fever/chills/nausea/vomiting/diarrhea/rash/headache/light sensitivity, ear discharge/bleeding.      Review of Systems  Constitutional: Negative for activity change, appetite change, chills, diaphoresis, fatigue, fever and unexpected weight change.  HENT: Positive for congestion, ear pain, postnasal drip, rhinorrhea and sinus pressure. Negative for dental problem, drooling, ear discharge, facial swelling, hearing loss, mouth sores, nosebleeds, sinus pain, sneezing, sore throat, tinnitus, trouble swallowing and voice change.   Eyes: Positive for discharge and itching. Negative for photophobia, pain, redness and visual disturbance.  Respiratory: Negative for cough, choking, chest tightness, shortness of breath, wheezing and stridor.   Cardiovascular: Negative for chest pain, palpitations and leg swelling.  Gastrointestinal: Negative for abdominal distention, abdominal pain, blood in stool, constipation, diarrhea, nausea and vomiting.  Endocrine: Negative for cold intolerance and heat intolerance.  Genitourinary: Negative for difficulty urinating, dysuria and hematuria.  Musculoskeletal: Negative for arthralgias, back pain, gait problem, joint swelling, myalgias, neck pain and neck stiffness.  Skin: Negative for color change, pallor,  rash and wound.  Allergic/Immunologic: Positive for environmental allergies. Negative for food allergies.  Neurological: Positive for headaches. Negative for dizziness, tremors, seizures, syncope, facial asymmetry, speech difficulty, weakness, light-headedness and numbness.  Hematological: Negative for adenopathy. Does not bruise/bleed easily.  Psychiatric/Behavioral: Negative for agitation, behavioral problems, confusion and sleep disturbance.       Objective:   Physical Exam  Constitutional: She is oriented to person, place, and time. She appears well-developed and well-nourished. She is active and cooperative.  Non-toxic appearance. She does not have a sickly appearance. She appears ill. No distress.  HENT:  Head: Normocephalic and atraumatic.  Right Ear: Hearing and ear canal normal. There is tenderness. Tympanic membrane is injected, erythematous and bulging. A middle ear effusion is present.  Left Ear: Hearing, external ear and ear canal normal. A middle ear effusion is present.  Nose: Mucosal edema and rhinorrhea present. No nose lacerations, sinus tenderness, nasal deformity, septal deviation or nasal septal hematoma. No epistaxis.  No foreign bodies. Right sinus exhibits maxillary sinus tenderness. Right sinus exhibits no frontal sinus tenderness. Left sinus exhibits maxillary sinus tenderness. Left sinus exhibits no frontal sinus tenderness.  Mouth/Throat: Uvula is midline and mucous membranes are normal. Mucous membranes are not pale, not dry and not cyanotic. She does not have dentures. No oral lesions. No trismus in the jaw. Normal dentition. No dental abscesses, uvula swelling, lacerations or dental caries. Posterior oropharyngeal edema and posterior oropharyngeal erythema present. No oropharyngeal exudate or tonsillar abscesses.  Bilateral maxillary sinus TTP; right external canal occluded by yellow stick cerumen used ear curretage removed 90% stopped due to patient discomfort with  trying to remove wax at distal canal 12-2 oclock no bleeding or discharge noted.  Patient reported pressure relieved/decreased pain upon completion and improved hearing; bilateral allergic shiners lower eyelids nonpitting edema 2+/4; bilateral nasal turbinates edema  erythema clear discharge; cobblestoning posterior pharynx; left TM air fluid level clear  Eyes: EOM and lids are normal. Pupils are equal, round, and reactive to light. Right eye exhibits no chemosis, no discharge, no exudate and no hordeolum. No foreign body present in the right eye. Left eye exhibits no chemosis, no discharge, no exudate and no hordeolum. No foreign body present in the left eye. Right conjunctiva is injected. Right conjunctiva has no hemorrhage. Left conjunctiva is injected. Left conjunctiva has no hemorrhage. No scleral icterus. Right eye exhibits normal extraocular motion and no nystagmus. Left eye exhibits normal extraocular motion and no nystagmus. Right pupil is round and reactive. Left pupil is round and reactive. Pupils are equal.  Bulbar eyelid and eye conjunctiva 1-2+/4 bilateral injection;   Neck: Trachea normal, normal range of motion and phonation normal. Neck supple. No tracheal tenderness, no spinous process tenderness and no muscular tenderness present. No neck rigidity. No tracheal deviation, no edema, no erythema and normal range of motion present. No thyroid mass and no thyromegaly present.  Cardiovascular: Normal rate, regular rhythm, S1 normal, S2 normal, normal heart sounds and intact distal pulses.  PMI is not displaced.  Exam reveals no gallop and no friction rub.   No murmur heard. Pulmonary/Chest: Effort normal and breath sounds normal. No accessory muscle usage or stridor. No respiratory distress. She has no decreased breath sounds. She has no wheezes. She has no rhonchi. She has no rales. She exhibits no tenderness.  Speaks full sentences without difficulty; no cough observed in exam room  Abdominal:  Soft. She exhibits no distension.  Musculoskeletal: Normal range of motion. She exhibits no edema or tenderness.       Right shoulder: Normal.       Left shoulder: Normal.       Right elbow: Normal.      Left elbow: Normal.       Right hip: Normal.       Left hip: Normal.       Right knee: Normal.       Left knee: Normal.       Cervical back: Normal.       Right hand: Normal.       Left hand: Normal.  Lymphadenopathy:       Head (right side): No submental, no submandibular, no tonsillar, no preauricular, no posterior auricular and no occipital adenopathy present.       Head (left side): No submental, no submandibular, no tonsillar, no preauricular, no posterior auricular and no occipital adenopathy present.    She has no cervical adenopathy.       Right cervical: No superficial cervical, no deep cervical and no posterior cervical adenopathy present.      Left cervical: No superficial cervical, no deep cervical and no posterior cervical adenopathy present.  Neurological: She is alert and oriented to person, place, and time. She has normal strength. She is not disoriented. She displays no atrophy and no tremor. No cranial nerve deficit or sensory deficit. She exhibits normal muscle tone. She displays no seizure activity. Coordination and gait normal. GCS eye subscore is 4. GCS verbal subscore is 5. GCS motor subscore is 6.  On/off exam table without difficulty and gait sure and steady in hallway  Skin: Skin is warm, dry and intact. No abrasion, no bruising, no burn, no ecchymosis, no laceration, no lesion, no petechiae and no rash noted. She is not diaphoretic. No cyanosis or erythema. No pallor. Nails show no clubbing.  Psychiatric:  She has a normal mood and affect. Her speech is normal and behavior is normal. Judgment and thought content normal. Cognition and memory are normal.  Nursing note and vitals reviewed.         Assessment & Plan:  A-cerumen impaction right auditory canal;  nonsupportive otitis media acute right; left otitis media effusion acute; seasonal allergic rhinitis conjunctivitis and acute rhinosinusitis, hypertension  P-Patient reported slight discomfort external ear canal after procedure ear curretage extraction right ear by provider, minor redness auditory canal where cerumen removed; bilateral TMs intact without erythema.  Patient reported sounds are louder now. Discussed purpose of earwax with patient.  Avoid cotton applicator (Q-tip) use in ears.  exitcare handout on cerumen impaction given to patient.  Patient verbalized understanding, agreed with plan of care and had no further questions at this time.    Discussed I cannot know if right ear improving or worsening since last round antibiotics-last dose 5 days ago.  Trial of allergy medications to see if symptoms improve if worsening start azithromycin dispensed from [facility].  Patient cannot tolerate amoxicillin GI upset with 10 day course (vomiting)  Supportive treatment.   No evidence of invasive bacterial infection, non toxic and well hydrated.  This is most likely self limiting viral infection.  I do not see where any further testing or imaging is necessary at this time.   I will suggest supportive care, rest, good hygiene and encourage the patient to take adequate fluids.  The patient is to return to clinic or EMERGENCY ROOM if symptoms worsen or change significantly e.g. ear pain, fever, purulent discharge from ears or bleeding.  Exitcare handout on otitis media with effusion given to patient.  Patient verbalized agreement and understanding of treatment plan.      reStart antihistamine medication po daily.  Given 4 UD refresh gtts ou 2 gtts TID prn eye irritation from clinic stock.  Shower after allergen exposure prior to bed.  Hygiene discussed. Dispose of current contacts and case. Patient to apply warm packs prn as directed.  Instructed patient to not rub eyes.  May use over the counter eye drops/tears for  pain/symptom relief.  Return to clinic if headache, fever greater than 100.47F, nausea/vomiting, purulent discharge/matting unable to open eye without using fingers, foreign body sensation, ciliary flush, worsening photophobia or vision.  Call or return to clinic as needed if these symptoms worsen or fail to improve as anticipated.  Patient given Exitcare handout on allergic conjunctivitis.  Patient verbalized agreement and understanding of treatment plan.   P2:  Hand washing, avoid contact use-wear glasses.  Patient may use normal saline nasal spray 2 sprays each nostril q2h wa as needed. Discussed would not sit outside on breaks/lunch rest of week. Take allegra consistently daily po and restart flonase 1 spray each nostril BID #1 RF0 dispensed from [facility].  Given 1 bottle nasal saline from clinic stock.  Avoid triggers if possible.  Shower prior to bedtime if exposed to triggers.  If allergic dust/dust mites recommend mattress/pillow covers/encasements; washing linens, vacuuming, sweeping, dusting weekly.  Call or return to clinic as needed if these symptoms worsen or fail to improve as anticipated.   Exitcare handout on allergic rhinitis given to patient.  Patient verbalized understanding of instructions, agreed with plan of care and had no further questions at this time.  P2:  Avoidance and hand washing.  If no improvement with allegra, nasal saline and flonase x 48 hours start azithromycin 500mg  po day 1 then 250 mg  po days 2-5 #6 RF0 dispensed from [facility].  No evidence of systemic bacterial infection, non toxic and well hydrated.  I do not see where any further testing or imaging is necessary at this time.   I will suggest supportive care, rest, good hygiene and encourage the patient to take adequate fluids.  The patient is to return to clinic or EMERGENCY ROOM if symptoms worsen or change significantly.  Exitcare handout on sinusitis and sinus rinse given to patient.  Patient verbalized agreement and  understanding of treatment plan and had no further questions at this time.   P2:  Hand washing and cover cough  Continue current medications as directed.  Continue to monitor blood pressure at home and maintain log of blood pressure and pulse to bring to follow up appointments.  Continue low sodium diet and exercise program.  Recommended weight loss/weight maintenance to BMI 20-25.  Return to the clinic if any new symptoms.  Patient verbalized agreement and understanding of treatment plan and had no further questions at this time.   P2:  Diet and Exercise specific for HTN

## 2016-08-20 NOTE — Patient Instructions (Signed)
Sinus Rinse What is a sinus rinse? A sinus rinse is a simple home treatment that is used to rinse your sinuses with a sterile mixture of salt and water (saline solution). Sinuses are air-filled spaces in your skull behind the bones of your face and forehead that open into your nasal cavity. You will use the following:  Saline solution.  Neti pot or spray bottle. This releases the saline solution into your nose and through your sinuses. Neti pots and spray bottles can be purchased at [employer], a health food store, or online.  When would I do a sinus rinse? A sinus rinse can help to clear mucus, dirt, dust, or pollen from the nasal cavity. You may do a sinus rinse when you have a cold, a virus, nasal allergy symptoms, a sinus infection, or stuffiness in the nose or sinuses. If you are considering a sinus rinse:  Ask your child's health care provider before performing a sinus rinse on your child.  Do not do a sinus rinse if you have had ear or nasal surgery, ear infection, or blocked ears.  How do I do a sinus rinse?  Wash your hands.  Disinfect your device according to the directions provided and then dry it.  Use the solution that comes with your device or one that is sold separately in stores. Follow the mixing directions on the package.  Fill your device with the amount of saline solution as directed by the device instructions.  Stand over a sink and tilt your head sideways over the sink.  Place the spout of the device in your upper nostril (the one closer to the ceiling).  Gently pour or squeeze the saline solution into the nasal cavity. The liquid should drain to the lower nostril if you are not overly congested.  Gently blow your nose. Blowing too hard may cause ear pain.  Repeat in the other nostril.  Clean and rinse your device with clean water and then air-dry it. Are there risks of a sinus rinse? Sinus rinse is generally very safe and effective. However,  there are a few risks, which include:  A burning sensation in the sinuses. This may happen if you do not make the saline solution as directed. Make sure to follow all directions when making the saline solution.  Infection from contaminated water. This is rare, but possible.  Nasal irritation.  This information is not intended to replace advice given to you by your health care provider. Make sure you discuss any questions you have with your health care provider. Document Released: 09/09/2013 Document Revised: 01/10/2016 Document Reviewed: 06/30/2013 Elsevier Interactive Patient Education  2017 [place]. Allergic Rhinitis Allergic rhinitis is when the mucous membranes in the nose respond to allergens. Allergens are particles in the air that cause your body to have an allergic reaction. This causes you to release allergic antibodies. Through a chain of events, these eventually cause you to release histamine into the blood stream. Although meant to protect the body, it is this release of histamine that causes your discomfort, such as frequent sneezing, congestion, and an itchy, runny nose. What are the causes? Seasonal allergic rhinitis (hay fever) is caused by pollen allergens that may come from grasses, trees, and weeds. Year-round allergic rhinitis (perennial allergic rhinitis) is caused by allergens such as house dust mites, pet dander, and mold spores. What are the signs or symptoms?  Nasal stuffiness (congestion).  Itchy, runny nose with sneezing and tearing of the eyes. How is  this diagnosed? Your health care provider can help you determine the allergen or allergens that trigger your symptoms. If you and your health care provider are unable to determine the allergen, skin or blood testing may be used. Your health care provider will diagnose your condition after taking your health history and performing a physical exam. Your health care provider may assess you for other related  conditions, such as asthma, pink eye, or an ear infection. How is this treated? Allergic rhinitis does not have a cure, but it can be controlled by:  Medicines that block allergy symptoms. These may include allergy shots, nasal sprays, and oral antihistamines.  Avoiding the allergen.  Hay fever may often be treated with antihistamines in pill or nasal spray forms. Antihistamines block the effects of histamine. There are over-the-counter medicines that may help with nasal congestion and swelling around the eyes. Check with your health care provider before taking or giving this medicine. If avoiding the allergen or the medicine prescribed do not work, there are many new medicines your health care provider can prescribe. Stronger medicine may be used if initial measures are ineffective. Desensitizing injections can be used if medicine and avoidance does not work. Desensitization is when a patient is given ongoing shots until the body becomes less sensitive to the allergen. Make sure you follow up with your health care provider if problems continue. Follow these instructions at home: It is not possible to completely avoid allergens, but you can reduce your symptoms by taking steps to limit your exposure to them. It helps to know exactly what you are allergic to so that you can avoid your specific triggers. Contact a health care provider if:  You have a fever.  You develop a cough that does not stop easily (persistent).  You have shortness of breath.  You start wheezing.  Symptoms interfere with normal daily activities. This information is not intended to replace advice given to you by your health care provider. Make sure you discuss any questions you have with your health care provider. Document Released: 11/07/2000 Document Revised: 10/14/2015 Document Reviewed: 10/20/2012 Elsevier Interactive Patient Education  2017 [employer] Allergic Conjunctivitis, Adult Allergic conjunctivitis is  inflammation of the clear membrane that covers the white part of your eye and the inner surface of your eyelid (conjunctiva). The inflammation is caused by allergies. The blood vessels in the conjunctiva become inflamed and this causes the eyes to become red or pink. The eyes often feel itchy. Allergic conjunctivitis cannot be spread from one person to another person (is not contagious). What are the causes? This condition is caused by an allergic reaction. Common causes of an allergic reaction (allergens) include:  Outdoor allergens, such as: ? Pollen. ? Grass and weeds. ? Mold spores.  Indoor allergens, such as: ? Dust. ? Smoke. ? Mold. ? Pet dander. ? Animal hair.  What increases the risk? You may be more likely to develop this condition if you have a family history of allergies, such as:  Allergic rhinitis.  Bronchial asthma.  Atopic dermatitis.  What are the signs or symptoms? Symptoms of this condition include eyes that are:  Itchy.  Red.  Watery.  Puffy.  Your eyes may also:  Sting or burn.  Have clear drainage coming from them.  How is this diagnosed? This condition may be diagnosed by medical history and physical exam. If you have drainage from your eyes, it may be tested to rule out other causes of conjunctivitis. You may also need  to see a health care provider who specializes in treating allergies (allergist) or eye conditions (ophthalmologist) for tests to confirm the diagnosis. You may have:  Skin tests to see which allergens are causing your symptoms. These tests involve pricking the skin with a tiny needle and exposing the skin to small amounts of potential allergens to see if your skin reacts.  Blood tests.  Tissue scrapings from your eyelid. These will be examined under a microscope.  How is this treated? Treatments for this condition may include:  Cold cloths (compresses) to soothe itching and swelling.  Washing the face to remove  allergens.  Eye drops. These may be prescription or over-the-counter. There are several different types. You may need to try different types to see which one works best for you. Your may need: ? Eye drops that block the allergic reaction (antihistamine). ? Eye drops that reduce swelling and irritation (anti-inflammatory). ? Steroid eye drops to lessen a severe reaction (vernal conjunctivitis).  Oral antihistamine medicines to reduce your allergic reaction. You may need these if eye drops do not help or are difficult to use.  Follow these instructions at home:  Avoid known allergens whenever possible.  Take or apply over-the-counter and prescription medicines only as told by your health care provider. These include any eye drops.  Apply a cool, clean washcloth to your eye for 10-20 minutes, 3-4 times a day.  Do not touch or rub your eyes.  Do not wear contact lenses until the inflammation is gone. Wear glasses instead.  Do not wear eye makeup until the inflammation is gone.  Keep all follow-up visits as told by your health care provider. This is important. Contact a health care provider if:  Your symptoms get worse or do not improve with treatment.  You have mild eye pain.  You have sensitivity to light.  You have spots or blisters on your eyes.  You have pus draining from your eye.  You have a fever. Get help right away if:  You have redness, swelling, or other symptoms in only one eye.  Your vision is blurred or you have vision changes.  You have severe eye pain. This information is not intended to replace advice given to you by your health care provider. Make sure you discuss any questions you have with your health care provider. Document Released: 05/05/2002 Document Revised: 10/12/2015 Document Reviewed: 08/26/2015 Elsevier Interactive Patient Education  2018 [employer]. Sinusitis, Adult Sinusitis is soreness and inflammation of your sinuses. Sinuses are hollow  spaces in the bones around your face. Your sinuses are located:  Around your eyes.  In the middle of your forehead.  Behind your nose.  In your cheekbones.  Your sinuses and nasal passages are lined with a stringy fluid (mucus). Mucus normally drains out of your sinuses. When your nasal tissues become inflamed or swollen, the mucus can become trapped or blocked so air cannot flow through your sinuses. This allows bacteria, viruses, and funguses to grow, which leads to infection. Sinusitis can develop quickly and last for 7?10 days (acute) or for more than 12 weeks (chronic). Sinusitis often develops after a cold. What are the causes? This condition is caused by anything that creates swelling in the sinuses or stops mucus from draining, including:  Allergies.  Asthma.  Bacterial or viral infection.  Abnormally shaped bones between the nasal passages.  Nasal growths that contain mucus (nasal polyps).  Narrow sinus openings.  Pollutants, such as chemicals or irritants in the air.  A foreign object stuck in the nose.  A fungal infection. This is rare.  What increases the risk? The following factors may make you more likely to develop this condition:  Having allergies or asthma.  Having had a recent cold or respiratory tract infection.  Having structural deformities or blockages in your nose or sinuses.  Having a weak immune system.  Doing a lot of swimming or diving.  Overusing nasal sprays.  Smoking.  What are the signs or symptoms? The main symptoms of this condition are pain and a feeling of pressure around the affected sinuses. Other symptoms include:  Upper toothache.  Earache.  Headache.  Bad breath.  Decreased sense of smell and taste.  A cough that may get worse at night.  Fatigue.  Fever.  Thick drainage from your nose. The drainage is often green and it may contain pus (purulent).  Stuffy nose or congestion.  Postnasal drip. This is when  extra mucus collects in the throat or back of the nose.  Swelling and warmth over the affected sinuses.  Sore throat.  Sensitivity to light.  How is this diagnosed? This condition is diagnosed based on symptoms, a medical history, and a physical exam. To find out if your condition is acute or chronic, your health care provider may:  Look in your nose for signs of nasal polyps.  Tap over the affected sinus to check for signs of infection.  View the inside of your sinuses using an imaging device that has a light attached (endoscope).  If your health care provider suspects that you have chronic sinusitis, you may also:  Be tested for allergies.  Have a sample of mucus taken from your nose (nasal culture) and checked for bacteria.  Have a mucus sample examined to see if your sinusitis is related to an allergy.  If your sinusitis does not respond to treatment and it lasts longer than 8 weeks, you may have an MRI or CT scan to check your sinuses. These scans also help to determine how severe your infection is. In rare cases, a bone biopsy may be done to rule out more serious types of fungal sinus disease. How is this treated? Treatment for sinusitis depends on the cause and whether your condition is chronic or acute. If a virus is causing your sinusitis, your symptoms will go away on their own within 10 days. You may be given medicines to relieve your symptoms, including:  Topical nasal decongestants. They shrink swollen nasal passages and let mucus drain from your sinuses.  Antihistamines. These drugs block inflammation that is triggered by allergies. This can help to ease swelling in your nose and sinuses.  Topical nasal corticosteroids. These are nasal sprays that ease inflammation and swelling in your nose and sinuses.  Nasal saline washes. These rinses can help to get rid of thick mucus in your nose.  If your condition is caused by bacteria, you will be given an antibiotic  medicine. If your condition is caused by a fungus, you will be given an antifungal medicine. Surgery may be needed to correct underlying conditions, such as narrow nasal passages. Surgery may also be needed to remove polyps. Follow these instructions at home: Medicines  Take, use, or apply over-the-counter and prescription medicines only as told by your health care provider. These may include nasal sprays.  If you were prescribed an antibiotic medicine, take it as told by your health care provider. Do not stop taking the antibiotic even if you start to  feel better. Hydrate and Humidify  Drink enough water to keep your urine clear or pale yellow. Staying hydrated will help to thin your mucus.  Use a cool mist humidifier to keep the humidity level in your home above 50%.  Inhale steam for 10-15 minutes, 3-4 times a day or as told by your health care provider. You can do this in the bathroom while a hot shower is running.  Limit your exposure to cool or dry air. Rest  Rest as much as possible.  Sleep with your head raised (elevated).  Make sure to get enough sleep each night. General instructions  Apply a warm, moist washcloth to your face 3-4 times a day or as told by your health care provider. This will help with discomfort.  Wash your hands often with soap and water to reduce your exposure to viruses and other germs. If soap and water are not available, use hand sanitizer.  Do not smoke. Avoid being around people who are smoking (secondhand smoke).  Keep all follow-up visits as told by your health care provider. This is important. Contact a health care provider if:  You have a fever.  Your symptoms get worse.  Your symptoms do not improve within 10 days. Get help right away if:  You have a severe headache.  You have persistent vomiting.  You have pain or swelling around your face or eyes.  You have vision problems.  You develop confusion.  Your neck is  stiff.  You have trouble breathing. This information is not intended to replace advice given to you by your health care provider. Make sure you discuss any questions you have with your health care provider. Document Released: 02/12/2005 Document Revised: 10/09/2015 Document Reviewed: 12/08/2014 Elsevier Interactive Patient Education  2017 [place]. Otitis Media, Adult Otitis media is redness, soreness, and puffiness (swelling) in the space just behind your eardrum (middle ear). It may be caused by allergies or infection. It often happens along with a cold. Follow these instructions at home:  Take your medicine as told. Finish it even if you start to feel better.  Only take over-the-counter or prescription medicines for pain, discomfort, or fever as told by your doctor.  Follow up with your doctor as told. Contact a doctor if:  You have otitis media only in one ear, or bleeding from your nose, or both.  You notice a lump on your neck.  You are not getting better in 3-5 days.  You feel worse instead of better. Get help right away if:  You have pain that is not helped with medicine.  You have puffiness, redness, or pain around your ear.  You get a stiff neck.  You cannot move part of your face (paralysis).  You notice that the bone behind your ear hurts when you touch it. This information is not intended to replace advice given to you by your health care provider. Make sure you discuss any questions you have with your health care provider. Document Released: 08/01/2007 Document Revised: 07/21/2015 Document Reviewed: 09/09/2012 Elsevier Interactive Patient Education  2017 [place], Adult The ears produce a substance called earwax that helps keep bacteria out of the ear and protects the skin in the ear canal. Occasionally, earwax can build up in the ear and cause discomfort or hearing loss. What increases the risk? This condition is more likely to develop  in people who:  Are female.  Are elderly.  Naturally produce more earwax.  Clean their ears  often with cotton swabs.  Use earplugs often.  Use in-ear headphones often.  Wear hearing aids.  Have narrow ear canals.  Have earwax that is overly thick or sticky.  Have eczema.  Are dehydrated.  Have excess hair in the ear canal.  What are the signs or symptoms? Symptoms of this condition include:  Reduced or muffled hearing.  A feeling of fullness in the ear or feeling that the ear is plugged.  Fluid coming from the ear.  Ear pain.  Ear itch.  Ringing in the ear.  Coughing.  An obvious piece of earwax that can be seen inside the ear canal.  How is this diagnosed? This condition may be diagnosed based on:  Your symptoms.  Your medical history.  An ear exam. During the exam, your health care provider will look into your ear with an instrument called an otoscope.  You may have tests, including a hearing test. How is this treated? This condition may be treated by:  Using ear drops to soften the earwax.  Having the earwax removed by a health care provider. The health care provider may: ? Flush the ear with water. ? Use an instrument that has a loop on the end (curette). ? Use a suction device.  Surgery to remove the wax buildup. This may be done in severe cases.  Follow these instructions at home:  Take over-the-counter and prescription medicines only as told by your health care provider.  Do not put any objects, including cotton swabs, into your ear. You can clean the opening of your ear canal with a washcloth or facial tissue.  Follow instructions from your health care provider about cleaning your ears. Do not over-clean your ears.  Drink enough fluid to keep your urine clear or pale yellow. This will help to thin the earwax.  Keep all follow-up visits as told by your health care provider. If earwax builds up in your ears often or if you use hearing  aids, consider seeing your health care provider for routine, preventive ear cleanings. Ask your health care provider how often you should schedule your cleanings.  If you have hearing aids, clean them according to instructions from the manufacturer and your health care provider. Contact a health care provider if:  You have ear pain.  You develop a fever.  You have blood, pus, or other fluid coming from your ear.  You have hearing loss.  You have ringing in your ears that does not go away.  Your symptoms do not improve with treatment.  You feel like the room is spinning (vertigo). Summary  Earwax can build up in the ear and cause discomfort or hearing loss.  The most common symptoms of this condition include reduced or muffled hearing and a feeling of fullness in the ear or feeling that the ear is plugged.  This condition may be diagnosed based on your symptoms, your medical history, and an ear exam.  This condition may be treated by using ear drops to soften the earwax or by having the earwax removed by a health care provider.  Do not put any objects, including cotton swabs, into your ear. You can clean the opening of your ear canal with a washcloth or facial tissue. This information is not intended to replace advice given to you by your health care provider. Make sure you discuss any questions you have with your health care provider. Document Released: 03/22/2004 Document Revised: 04/25/2016 Document Reviewed: 04/25/2016 Elsevier Interactive Patient Education  2018  [employer].

## 2016-09-11 ENCOUNTER — Ambulatory Visit: Payer: Self-pay | Admitting: Registered Nurse

## 2016-09-11 VITALS — BP 133/83 | HR 62 | Temp 98.5°F

## 2016-09-11 DIAGNOSIS — H6593 Unspecified nonsuppurative otitis media, bilateral: Secondary | ICD-10-CM

## 2016-09-11 DIAGNOSIS — J0101 Acute recurrent maxillary sinusitis: Secondary | ICD-10-CM

## 2016-09-11 MED ORDER — MECLIZINE HCL 25 MG PO TABS: 25.0000 mg | ORAL_TABLET | Freq: Four times a day (QID) | ORAL | 0 refills | Status: AC | PRN

## 2016-09-11 MED ORDER — PREDNISONE 10 MG (21) PO TBPK: ORAL_TABLET | ORAL | 0 refills | Status: DC

## 2016-09-11 MED ORDER — SALINE SPRAY 0.65 % NA SOLN: 2.0000 | NASAL | 0 refills | Status: DC

## 2016-09-11 NOTE — Progress Notes (Signed)
Subjective:    Patient ID: Jamie Kelley, female    DOB: 1956/02/15, 61 y.o.   MRN: [id number]  61y/o african [place] female established patient reports productive cough clear sputum x2 weeks. Dizziness, lightheadedness over past 3-4 days. C/o frontal sinus pain/pressure/headache but cheeks are hurting more when touched. Denies otalgia, sore throat. Has tried Coricidin cold/cough, Zyrtec, and an OTC cough syrup without relief.  Using her inhaler prn typically at night.  Flonase 1 spray each nostril BID, nasal saline maybe 1-2 times per day.  Allegra not working as well as usual this year and has had multiple rounds of azithromycin/amoxicillin in previous year.  Denied fever/hemoptysis/ear discharge/rash/dyspnea      Review of Systems  Constitutional: Negative for activity change, appetite change, chills, diaphoresis, fatigue, fever and unexpected weight change.  HENT: Positive for congestion, postnasal drip, rhinorrhea, sinus pain and sinus pressure. Negative for dental problem, drooling, ear discharge, ear pain, facial swelling, hearing loss, mouth sores, nosebleeds, sneezing, sore throat, tinnitus, trouble swallowing and voice change.   Eyes: Negative for photophobia, pain, discharge, redness, itching and visual disturbance.  Respiratory: Positive for cough, chest tightness and wheezing. Negative for choking, shortness of breath and stridor.   Cardiovascular: Negative for chest pain, palpitations and leg swelling.  Gastrointestinal: Negative for abdominal distention, abdominal pain, blood in stool, constipation, diarrhea, nausea and vomiting.  Endocrine: Negative for cold intolerance and heat intolerance.  Genitourinary: Negative for difficulty urinating, dysuria and hematuria.  Musculoskeletal: Negative for arthralgias, back pain, gait problem, joint swelling, myalgias, neck pain and neck stiffness.  Skin: Negative for color change, pallor, rash and wound.  Allergic/Immunologic: Positive for  environmental allergies. Negative for food allergies.  Neurological: Positive for dizziness and headaches. Negative for tremors, seizures, syncope, facial asymmetry, speech difficulty, weakness, light-headedness and numbness.  Hematological: Negative for adenopathy. Does not bruise/bleed easily.  Psychiatric/Behavioral: Negative for agitation, behavioral problems, confusion and sleep disturbance.       Objective:   Physical Exam  Constitutional: She is oriented to person, place, and time. She appears well-developed and well-nourished. She is active and cooperative.  Non-toxic appearance. She does not have a sickly appearance. She appears ill. No distress.  HENT:  Head: Normocephalic and atraumatic.  Right Ear: Hearing, external ear and ear canal normal. A middle ear effusion is present.  Left Ear: Hearing, external ear and ear canal normal. A middle ear effusion is present.  Nose: Mucosal edema and rhinorrhea present. No nose lacerations, sinus tenderness, nasal deformity, septal deviation or nasal septal hematoma. No epistaxis.  No foreign bodies. Right sinus exhibits maxillary sinus tenderness and frontal sinus tenderness. Left sinus exhibits no maxillary sinus tenderness and no frontal sinus tenderness.  Mouth/Throat: Uvula is midline and mucous membranes are normal. Mucous membranes are not pale, not dry and not cyanotic. She does not have dentures. No oral lesions. No trismus in the jaw. Normal dentition. No dental abscesses, uvula swelling, lacerations or dental caries. Posterior oropharyngeal edema and posterior oropharyngeal erythema present. No oropharyngeal exudate or tonsillar abscesses.  Mild cobblestoning posterior pharynx; bilateral allergic shiners; bilateral TMs air fluid level clear; bilateral nasal turbinates edema/erythema/clear discharge;   Eyes: Pupils are equal, round, and reactive to light. Conjunctivae, EOM and lids are normal. Right eye exhibits no chemosis, no discharge, no  exudate and no hordeolum. No foreign body present in the right eye. Left eye exhibits no chemosis, no discharge, no exudate and no hordeolum. No foreign body present in the left eye. Right conjunctiva is  not injected. Right conjunctiva has no hemorrhage. Left conjunctiva is not injected. Left conjunctiva has no hemorrhage. No scleral icterus. Right eye exhibits normal extraocular motion and no nystagmus. Left eye exhibits normal extraocular motion and no nystagmus. Right pupil is round and reactive. Left pupil is round and reactive. Pupils are equal.  Neck: Trachea normal and normal range of motion. Neck supple. No tracheal tenderness, no spinous process tenderness and no muscular tenderness present. No neck rigidity. No tracheal deviation, no edema, no erythema and normal range of motion present. No thyroid mass and no thyromegaly present.  Cardiovascular: Normal rate, regular rhythm, S1 normal, S2 normal, normal heart sounds and intact distal pulses.  PMI is not displaced.  Exam reveals no gallop and no friction rub.   No murmur heard. Pulmonary/Chest: Effort normal and breath sounds normal. No accessory muscle usage or stridor. No respiratory distress. She has no decreased breath sounds. She has no wheezes. She has no rhonchi. She has no rales. She exhibits no tenderness.  Speaks full sentences without difficulty cough not observed in exam room  Abdominal: Soft. She exhibits no distension.  Musculoskeletal: Normal range of motion. She exhibits no edema or tenderness.       Right shoulder: Normal.       Left shoulder: Normal.       Right hip: Normal.       Left hip: Normal.       Right knee: Normal.       Left knee: Normal.       Cervical back: Normal.       Right hand: Normal.       Left hand: Normal.  Lymphadenopathy:       Head (right side): No submental, no submandibular, no tonsillar, no preauricular, no posterior auricular and no occipital adenopathy present.       Head (left side): No  submental, no submandibular, no tonsillar, no preauricular, no posterior auricular and no occipital adenopathy present.    She has no cervical adenopathy.       Right cervical: No superficial cervical, no deep cervical and no posterior cervical adenopathy present.      Left cervical: No superficial cervical, no deep cervical and no posterior cervical adenopathy present.  Neurological: She is alert and oriented to person, place, and time. She has normal strength. She is not disoriented. She displays no atrophy and no tremor. No cranial nerve deficit or sensory deficit. She exhibits normal muscle tone. She displays no seizure activity. Coordination and gait normal. GCS eye subscore is 4. GCS verbal subscore is 5. GCS motor subscore is 6.  Skin: Skin is warm, dry and intact. No abrasion, no bruising, no burn, no ecchymosis, no laceration, no lesion, no petechiae and no rash noted. She is not diaphoretic. No cyanosis or erythema. No pallor. Nails show no clubbing.  Psychiatric: She has a normal mood and affect. Her speech is normal and behavior is normal. Judgment and thought content normal. Cognition and memory are normal.  Nursing note and vitals reviewed.         Assessment & Plan:  A-acute recurrent maxillary sinusitis and bilateral otitis media effusion  P-prednisone taper 10mg  60/50/40/30/20/10mg  po daily with breakfast #21 RF0 dispensed from [facility]  Consider doxycycline 100mg  po BID x 10 days #20 RF0 if no relief with flonase/nasal saline/allegra or meclizine.No evidence of systemic bacterial infection, non toxic and well hydrated.  I do not see where any further testing or imaging is necessary at this  time.   I will suggest supportive care, rest, good hygiene and encourage the patient to take adequate fluids.  The patient is to return to clinic or EMERGENCY ROOM if symptoms worsen or change significantly.  Exitcare handout on sinusitis and sinus rinse given to patient.  Patient verbalized  agreement and understanding of treatment plan and had no further questions at this time.   P2:  Hand washing and cover cough  Discussed with patient otitis media with effusion probably causing vertigo but could also be age.    Supportive treatment may take up to 4 doses meclizine 25mg  per day max 100mg  per 24 hours.  Discussed signs/symptoms stroke.  recommended not driving during vertigo episodes.  Follow up if aphasia, dysphasia, visual changes, weakness, fall, worst headache of life, incoordination, fever, ear discharge.  Consider ENT evaluation/follow up with PCM if worsening symptoms not controlled with meclizine or needs Rx refill  Electronic Rx to pharmacy of choice meclizine 25mg  po QID prn dizzyness #30 RF0.  Stop allegra if taking meclizine daily continue flonase/nasal saline/shower prior to bedtime.  Patient verbalized understanding of information/agreed with plan of care and had no further questions at this time.  Supportive treatment.   No evidence of invasive bacterial infection, non toxic and well hydrated.  This is most likely self limiting viral infection.  I do not see where any further testing or imaging is necessary at this time.   I will suggest supportive care, rest, good hygiene and encourage the patient to take adequate fluids.  The patient is to return to clinic or EMERGENCY ROOM if symptoms worsen or change significantly e.g. ear pain, fever, purulent discharge from ears or bleeding.  Exitcare handout on otitis media with effusion given to patient.  Patient verbalized agreement and understanding of treatment plan.

## 2016-09-11 NOTE — Patient Instructions (Signed)
Sinus Rinse What is a sinus rinse? A sinus rinse is a simple home treatment that is used to rinse your sinuses with a sterile mixture of salt and water (saline solution). Sinuses are air-filled spaces in your skull behind the bones of your face and forehead that open into your nasal cavity. You will use the following:  Saline solution.  Neti pot or spray bottle. This releases the saline solution into your nose and through your sinuses. Neti pots and spray bottles can be purchased at [employer], a health food store, or online.  When would I do a sinus rinse? A sinus rinse can help to clear mucus, dirt, dust, or pollen from the nasal cavity. You may do a sinus rinse when you have a cold, a virus, nasal allergy symptoms, a sinus infection, or stuffiness in the nose or sinuses. If you are considering a sinus rinse:  Ask your child's health care provider before performing a sinus rinse on your child.  Do not do a sinus rinse if you have had ear or nasal surgery, ear infection, or blocked ears.  How do I do a sinus rinse?  Wash your hands.  Disinfect your device according to the directions provided and then dry it.  Use the solution that comes with your device or one that is sold separately in stores. Follow the mixing directions on the package.  Fill your device with the amount of saline solution as directed by the device instructions.  Stand over a sink and tilt your head sideways over the sink.  Place the spout of the device in your upper nostril (the one closer to the ceiling).  Gently pour or squeeze the saline solution into the nasal cavity. The liquid should drain to the lower nostril if you are not overly congested.  Gently blow your nose. Blowing too hard may cause ear pain.  Repeat in the other nostril.  Clean and rinse your device with clean water and then air-dry it. Are there risks of a sinus rinse? Sinus rinse is generally very safe and effective. However,  there are a few risks, which include:  A burning sensation in the sinuses. This may happen if you do not make the saline solution as directed. Make sure to follow all directions when making the saline solution.  Infection from contaminated water. This is rare, but possible.  Nasal irritation.  This information is not intended to replace advice given to you by your health care provider. Make sure you discuss any questions you have with your health care provider. Document Released: 09/09/2013 Document Revised: 01/10/2016 Document Reviewed: 06/30/2013 Elsevier Interactive Patient Education  2017 [employer] Sinusitis, Adult Sinusitis is soreness and inflammation of your sinuses. Sinuses are hollow spaces in the bones around your face. Your sinuses are located:  Around your eyes.  In the middle of your forehead.  Behind your nose.  In your cheekbones.  Your sinuses and nasal passages are lined with a stringy fluid (mucus). Mucus normally drains out of your sinuses. When your nasal tissues become inflamed or swollen, the mucus can become trapped or blocked so air cannot flow through your sinuses. This allows bacteria, viruses, and funguses to grow, which leads to infection. Sinusitis can develop quickly and last for 7?10 days (acute) or for more than 12 weeks (chronic). Sinusitis often develops after a cold. What are the causes? This condition is caused by anything that creates swelling in the sinuses or stops mucus from draining, including:  Allergies.  Asthma.  Bacterial or viral infection.  Abnormally shaped bones between the nasal passages.  Nasal growths that contain mucus (nasal polyps).  Narrow sinus openings.  Pollutants, such as chemicals or irritants in the air.  A foreign object stuck in the nose.  A fungal infection. This is rare.  What increases the risk? The following factors may make you more likely to develop this condition:  Having allergies or  asthma.  Having had a recent cold or respiratory tract infection.  Having structural deformities or blockages in your nose or sinuses.  Having a weak immune system.  Doing a lot of swimming or diving.  Overusing nasal sprays.  Smoking.  What are the signs or symptoms? The main symptoms of this condition are pain and a feeling of pressure around the affected sinuses. Other symptoms include:  Upper toothache.  Earache.  Headache.  Bad breath.  Decreased sense of smell and taste.  A cough that may get worse at night.  Fatigue.  Fever.  Thick drainage from your nose. The drainage is often green and it may contain pus (purulent).  Stuffy nose or congestion.  Postnasal drip. This is when extra mucus collects in the throat or back of the nose.  Swelling and warmth over the affected sinuses.  Sore throat.  Sensitivity to light.  How is this diagnosed? This condition is diagnosed based on symptoms, a medical history, and a physical exam. To find out if your condition is acute or chronic, your health care provider may:  Look in your nose for signs of nasal polyps.  Tap over the affected sinus to check for signs of infection.  View the inside of your sinuses using an imaging device that has a light attached (endoscope).  If your health care provider suspects that you have chronic sinusitis, you may also:  Be tested for allergies.  Have a sample of mucus taken from your nose (nasal culture) and checked for bacteria.  Have a mucus sample examined to see if your sinusitis is related to an allergy.  If your sinusitis does not respond to treatment and it lasts longer than 8 weeks, you may have an MRI or CT scan to check your sinuses. These scans also help to determine how severe your infection is. In rare cases, a bone biopsy may be done to rule out more serious types of fungal sinus disease. How is this treated? Treatment for sinusitis depends on the cause and  whether your condition is chronic or acute. If a virus is causing your sinusitis, your symptoms will go away on their own within 10 days. You may be given medicines to relieve your symptoms, including:  Topical nasal decongestants. They shrink swollen nasal passages and let mucus drain from your sinuses.  Antihistamines. These drugs block inflammation that is triggered by allergies. This can help to ease swelling in your nose and sinuses.  Topical nasal corticosteroids. These are nasal sprays that ease inflammation and swelling in your nose and sinuses.  Nasal saline washes. These rinses can help to get rid of thick mucus in your nose.  If your condition is caused by bacteria, you will be given an antibiotic medicine. If your condition is caused by a fungus, you will be given an antifungal medicine. Surgery may be needed to correct underlying conditions, such as narrow nasal passages. Surgery may also be needed to remove polyps. Follow these instructions at home: Medicines  Take, use, or apply over-the-counter and prescription medicines only as told by  your health care provider. These may include nasal sprays.  If you were prescribed an antibiotic medicine, take it as told by your health care provider. Do not stop taking the antibiotic even if you start to feel better. Hydrate and Humidify  Drink enough water to keep your urine clear or pale yellow. Staying hydrated will help to thin your mucus.  Use a cool mist humidifier to keep the humidity level in your home above 50%.  Inhale steam for 10-15 minutes, 3-4 times a day or as told by your health care provider. You can do this in the bathroom while a hot shower is running.  Limit your exposure to cool or dry air. Rest  Rest as much as possible.  Sleep with your head raised (elevated).  Make sure to get enough sleep each night. General instructions  Apply a warm, moist washcloth to your face 3-4 times a day or as told by your  health care provider. This will help with discomfort.  Wash your hands often with soap and water to reduce your exposure to viruses and other germs. If soap and water are not available, use hand sanitizer.  Do not smoke. Avoid being around people who are smoking (secondhand smoke).  Keep all follow-up visits as told by your health care provider. This is important. Contact a health care provider if:  You have a fever.  Your symptoms get worse.  Your symptoms do not improve within 10 days. Get help right away if:  You have a severe headache.  You have persistent vomiting.  You have pain or swelling around your face or eyes.  You have vision problems.  You develop confusion.  Your neck is stiff.  You have trouble breathing. This information is not intended to replace advice given to you by your health care provider. Make sure you discuss any questions you have with your health care provider. Document Released: 02/12/2005 Document Revised: 10/09/2015 Document Reviewed: 12/08/2014 Elsevier Interactive Patient Education  2017 [employer] Inc[employer]  

## 2016-11-14 ENCOUNTER — Encounter: Payer: Self-pay | Admitting: Family Medicine

## 2016-11-14 ENCOUNTER — Ambulatory Visit (INDEPENDENT_AMBULATORY_CARE_PROVIDER_SITE_OTHER): Payer: PRIVATE HEALTH INSURANCE | Admitting: Family Medicine

## 2016-11-14 ENCOUNTER — Encounter: Payer: Self-pay | Admitting: General Practice

## 2016-11-14 VITALS — BP 130/82 | HR 84 | Temp 98.0°F | Resp 16 | Ht 61.0 in | Wt 184.1 lb

## 2016-11-14 DIAGNOSIS — I10 Essential (primary) hypertension: Secondary | ICD-10-CM

## 2016-11-14 DIAGNOSIS — Z23 Encounter for immunization: Secondary | ICD-10-CM

## 2016-11-14 DIAGNOSIS — E785 Hyperlipidemia, unspecified: Secondary | ICD-10-CM | POA: Diagnosis not present

## 2016-11-14 DIAGNOSIS — E038 Other specified hypothyroidism: Secondary | ICD-10-CM | POA: Diagnosis not present

## 2016-11-14 LAB — HEPATIC FUNCTION PANEL
ALT: 14 U/L (ref 0–35)
AST: 18 U/L (ref 0–37)
Albumin: 4.4 g/dL (ref 3.5–5.2)
Alkaline Phosphatase: 63 U/L (ref 39–117)
BILIRUBIN DIRECT: 0.1 mg/dL (ref 0.0–0.3)
BILIRUBIN TOTAL: 0.8 mg/dL (ref 0.2–1.2)
TOTAL PROTEIN: 7.1 g/dL (ref 6.0–8.3)

## 2016-11-14 LAB — CBC WITH DIFFERENTIAL/PLATELET
BASOS ABS: 0 10*3/uL (ref 0.0–0.1)
Basophils Relative: 0.7 % (ref 0.0–3.0)
Eosinophils Absolute: 0.2 10*3/uL (ref 0.0–0.7)
Eosinophils Relative: 2.7 % (ref 0.0–5.0)
HEMATOCRIT: 40.1 % (ref 36.0–46.0)
HEMOGLOBIN: 13.5 g/dL (ref 12.0–15.0)
LYMPHS PCT: 36.1 % (ref 12.0–46.0)
Lymphs Abs: 2.2 10*3/uL (ref 0.7–4.0)
MCHC: 33.7 g/dL (ref 30.0–36.0)
MCV: 90.7 fl (ref 78.0–100.0)
MONOS PCT: 7.4 % (ref 3.0–12.0)
Monocytes Absolute: 0.5 10*3/uL (ref 0.1–1.0)
NEUTROS ABS: 3.3 10*3/uL (ref 1.4–7.7)
Neutrophils Relative %: 53.1 % (ref 43.0–77.0)
PLATELETS: 180 10*3/uL (ref 150.0–400.0)
RBC: 4.42 Mil/uL (ref 3.87–5.11)
RDW: 14.3 % (ref 11.5–15.5)
WBC: 6.1 10*3/uL (ref 4.0–10.5)

## 2016-11-14 LAB — BASIC METABOLIC PANEL
BUN: 13 mg/dL (ref 6–23)
CALCIUM: 9.6 mg/dL (ref 8.4–10.5)
CO2: 28 mEq/L (ref 19–32)
CREATININE: 0.64 mg/dL (ref 0.40–1.20)
Chloride: 103 mEq/L (ref 96–112)
GFR: 122.04 mL/min (ref >60.00)
Glucose, Bld: 94 mg/dL (ref 70–99)
Potassium: 3.7 mEq/L (ref 3.5–5.1)
SODIUM: 139 meq/L (ref 135–145)

## 2016-11-14 LAB — LIPID PANEL
Cholesterol: 169 mg/dL (ref 0–200)
HDL: 75.8 mg/dL (ref >39.00)
LDL CALC: 81 mg/dL (ref 0–99)
NONHDL: 93.14
Total CHOL/HDL Ratio: 2
Triglycerides: 61 mg/dL (ref 0.0–149.0)
VLDL: 12.2 mg/dL (ref 0.0–40.0)

## 2016-11-14 LAB — TSH: TSH: 0.87 u[IU]/mL (ref 0.35–4.50)

## 2016-11-14 MED ORDER — HYDROCHLOROTHIAZIDE 25 MG PO TABS: 25.0000 mg | ORAL_TABLET | Freq: Every day | ORAL | 1 refills | Status: DC

## 2016-11-14 MED ORDER — AMLODIPINE BESYLATE 10 MG PO TABS: 10.0000 mg | ORAL_TABLET | Freq: Every day | ORAL | 1 refills | Status: DC

## 2016-11-14 MED ORDER — SIMVASTATIN 20 MG PO TABS: 20.0000 mg | ORAL_TABLET | Freq: Every day | ORAL | 1 refills | Status: DC

## 2016-11-14 MED ORDER — LEVOTHYROXINE SODIUM 50 MCG PO TABS: 25.0000 ug | ORAL_TABLET | Freq: Every day | ORAL | 1 refills | Status: DC

## 2016-11-14 MED ORDER — ATENOLOL 50 MG PO TABS: 100.0000 mg | ORAL_TABLET | Freq: Every day | ORAL | 1 refills | Status: DC

## 2016-11-14 NOTE — Progress Notes (Signed)
   Subjective:    Patient ID: Jamie Kelley, female    DOB: [birth year], 61 y.o.   MRN: [id number]  HPI HTN- chronic problem, on Amlodipine 10mg  daily, Atenolol 100mg  daily, HCTZ 25mg  daily w/ adequate control.  No CP, SOB, HAs, visual changes, edema.  Hyperlipidemia- chronic problem, on Simvastatin 20mg  daily.  Pt has lost 8 lbs since last visit.  No abd pain, N/V.  Hypothyroid- chronic problem, on Levothyroxine 74mcg daily.  Denies changes to skin/hair/nails.   Review of Systems For ROS see HPI     Objective:   Physical Exam  Constitutional: She is oriented to person, place, and time. She appears well-developed and well-nourished. No distress.  HENT:  Head: Normocephalic and atraumatic.  Eyes: Pupils are equal, round, and reactive to light. Conjunctivae and EOM are normal.  Neck: Normal range of motion. Neck supple. No thyromegaly present.  Cardiovascular: Normal rate, regular rhythm, normal heart sounds and intact distal pulses.   No murmur heard. Pulmonary/Chest: Effort normal and breath sounds normal. No respiratory distress.  Abdominal: Soft. She exhibits no distension. There is no tenderness.  Musculoskeletal: She exhibits no edema.  Lymphadenopathy:    She has no cervical adenopathy.  Neurological: She is alert and oriented to person, place, and time.  Skin: Skin is warm and dry.  Psychiatric: She has a normal mood and affect. Her behavior is normal.  Vitals reviewed.         Assessment & Plan:

## 2016-11-14 NOTE — Assessment & Plan Note (Signed)
Chronic problem.  Currently asymptomatic.  Check labs.  Adjust meds prn  

## 2016-11-14 NOTE — Patient Instructions (Signed)
Schedule your complete physical in 3-4 months We'll notify you of your lab results and make any changes if needed Continue to work on healthy diet and regular exercise- you can do it! Call with any questions or concerns Happy Fall!!! 

## 2016-11-14 NOTE — Progress Notes (Signed)
Pre visit review using our clinic review tool, if applicable. No additional management support is needed unless otherwise documented below in the visit note. 

## 2016-11-14 NOTE — Assessment & Plan Note (Signed)
Chronic problem.  Adequate control today.  Asymptomatic.  Check labs.  No anticipated med changes.  Will follow. 

## 2016-11-14 NOTE — Assessment & Plan Note (Signed)
Chronic problem.  Tolerating statin w/o difficulty.  Check labs.  Adjust meds prn  

## 2016-11-21 ENCOUNTER — Encounter: Payer: Self-pay | Admitting: Family Medicine

## 2016-11-22 ENCOUNTER — Encounter: Payer: Self-pay | Admitting: Family Medicine

## 2016-12-06 ENCOUNTER — Ambulatory Visit: Payer: Self-pay | Admitting: Registered Nurse

## 2016-12-06 VITALS — BP 137/85 | HR 62 | Temp 98.2°F

## 2016-12-06 DIAGNOSIS — M7522 Bicipital tendinitis, left shoulder: Secondary | ICD-10-CM

## 2016-12-06 DIAGNOSIS — S39012A Strain of muscle, fascia and tendon of lower back, initial encounter: Secondary | ICD-10-CM

## 2016-12-06 DIAGNOSIS — S46912A Strain of unspecified muscle, fascia and tendon at shoulder and upper arm level, left arm, initial encounter: Secondary | ICD-10-CM

## 2016-12-06 MED ORDER — MENTHOL (TOPICAL ANALGESIC) 4 % EX GEL: 1.0000 "application " | Freq: Four times a day (QID) | CUTANEOUS | 0 refills | Status: AC | PRN

## 2016-12-06 MED ORDER — IBUPROFEN 800 MG PO TABS: 800.0000 mg | ORAL_TABLET | Freq: Two times a day (BID) | ORAL | 0 refills | Status: AC | PRN

## 2016-12-06 MED ORDER — CYCLOBENZAPRINE HCL 10 MG PO TABS: 10.0000 mg | ORAL_TABLET | Freq: Three times a day (TID) | ORAL | 0 refills | Status: DC | PRN

## 2016-12-06 MED ORDER — ACETAMINOPHEN 500 MG PO TABS: 1000.0000 mg | ORAL_TABLET | Freq: Four times a day (QID) | ORAL | 0 refills | Status: AC | PRN

## 2016-12-06 NOTE — Progress Notes (Signed)
Subjective:    Patient ID: Jamie Kelley, female    DOB: 04-Feb-1956, 61 y.o.   MRN: [id number]  61y/o african american female established Pt reports lower back and L shoulder pain x2 weeks. Soreness constant with intermittent spasming. Has noticed pain in shoulder worse when moving shoulder forward or moving her left arm in front of body, spasm starts. Has tried ibuprofen 600mg  once a day at home with minimal relief.   Right hand dominant.  Has been lifting a lot of crates at work and moved furniture at home.  Thinks this is what caused pain.  Last low back pain years ago used flexeril/motrin.  Has [facility]ac[facility].  Ran out of flexeril long time ago.  Hasn't tried biofreeze.  Has noticed popping left shoulder pain radiates around arm left biceps to scapula.  Denied swelling/rash/bruising, loss of bowel/bladder control, saddle paresthesias, arm/leg weakness, dropping items/falls.      Review of Systems  Constitutional: Negative for activity change, appetite change, chills, diaphoresis, fatigue and fever.  HENT: Negative for trouble swallowing and voice change.   Eyes: Negative for photophobia and visual disturbance.  Respiratory: Negative for cough, shortness of breath, wheezing and stridor.   Cardiovascular: Negative for chest pain, palpitations and leg swelling.  Gastrointestinal: Negative for diarrhea and vomiting.  Endocrine: Negative for cold intolerance and heat intolerance.  Genitourinary: Negative for difficulty urinating and dysuria.  Musculoskeletal: Positive for arthralgias, back pain and myalgias. Negative for gait problem, joint swelling, neck pain and neck stiffness.  Allergic/Immunologic: Positive for environmental allergies. Negative for food allergies.  Neurological: Negative for dizziness, tremors, seizures, syncope, facial asymmetry, speech difficulty, weakness, light-headedness, numbness and headaches.  Hematological: Negative for adenopathy. Does not bruise/bleed easily.   Psychiatric/Behavioral: Positive for sleep disturbance. Negative for agitation.       Objective:   Physical Exam  Constitutional: She is oriented to person, place, and time. Vital signs are normal. She appears well-developed and well-nourished. She is active and cooperative.  Non-toxic appearance. She does not have a sickly appearance. She appears ill. No distress.  HENT:  Head: Normocephalic and atraumatic.  Right Ear: Hearing and external ear normal.  Left Ear: Hearing and external ear normal.  Nose: Nose normal. No mucosal edema, rhinorrhea, nose lacerations, nasal deformity, septal deviation or nasal septal hematoma. No epistaxis.  No foreign bodies.  Mouth/Throat: Uvula is midline and mucous membranes are normal. Mucous membranes are not pale, not dry and not cyanotic. She does not have dentures. No oral lesions. No trismus in the jaw. Normal dentition. No dental abscesses, uvula swelling, lacerations or dental caries. Posterior oropharyngeal edema and posterior oropharyngeal erythema present. No oropharyngeal exudate or tonsillar abscesses.  Bilateral allergic shiners; cobblestoning posterior pharynx;  Eyes: Pupils are equal, round, and reactive to light. Conjunctivae, EOM and lids are normal. Right eye exhibits no chemosis, no discharge, no exudate and no hordeolum. No foreign body present in the right eye. Left eye exhibits no chemosis, no discharge, no exudate and no hordeolum. No foreign body present in the left eye. Right conjunctiva is not injected. Right conjunctiva has no hemorrhage. Left conjunctiva is not injected. Left conjunctiva has no hemorrhage. No scleral icterus. Right eye exhibits normal extraocular motion and no nystagmus. Left eye exhibits normal extraocular motion and no nystagmus. Right pupil is round and reactive. Left pupil is round and reactive. Pupils are equal.  Neck: Trachea normal, normal range of motion and phonation normal. Neck supple. Muscular tenderness  present. No tracheal tenderness and no  spinous process tenderness present. No neck rigidity. No tracheal deviation, no edema, no erythema and normal range of motion present. No thyroid mass and no thyromegaly present.    Left suprascapular ridge and central scapular tight TTP; bilateral trapezius tight but not TTP; full AROM with end AROM pain  Cardiovascular: Normal rate, regular rhythm, S1 normal, S2 normal, normal heart sounds and intact distal pulses.  PMI is not displaced.  Exam reveals no gallop and no friction rub.   No murmur heard. Pulses:      Radial pulses are 2+ on the right side, and 2+ on the left side.  Pulmonary/Chest: Effort normal and breath sounds normal. No accessory muscle usage or stridor. No respiratory distress. She has no decreased breath sounds. She has no wheezes. She has no rhonchi. She has no rales. She exhibits no tenderness.  No cough observed; spoke full sentences without difficulty in exam room  Abdominal: Soft. Normal appearance. She exhibits no distension, no fluid wave and no ascites. There is no rigidity and no guarding.  Musculoskeletal: She exhibits tenderness. She exhibits no edema or deformity.       Right shoulder: Normal.       Left shoulder: She exhibits decreased range of motion, tenderness, pain and spasm. She exhibits no bony tenderness, no swelling, no effusion, no crepitus, no deformity, no laceration, normal pulse and normal strength.       Right elbow: Normal.      Left elbow: Normal.       Right wrist: Normal.       Left wrist: Normal.       Right hip: Normal.       Left hip: Normal.       Right knee: Normal.       Left knee: Normal.       Cervical back: Normal.       Thoracic back: Normal.       Lumbar back: She exhibits tenderness, pain and spasm. She exhibits normal range of motion, no bony tenderness, no swelling, no edema, no deformity, no laceration and normal pulse.       Back:       Right upper arm: Normal.       Left upper arm:  She exhibits tenderness. She exhibits no bony tenderness, no swelling, no edema, no deformity and no laceration.       Right forearm: Normal.       Left forearm: Normal.       Right hand: Normal.       Left hand: Normal.  +NEERs/atchley scratch left; proximal biceps tendon and muscle TTP no deformity palpated normal strength 5/5 but painful for patient; internal rotation left painful to patient; bilateral L1-5 paraspinals tight/spasm and right SI joint TTP  Lymphadenopathy:       Head (right side): No submental, no submandibular, no tonsillar, no preauricular, no posterior auricular and no occipital adenopathy present.       Head (left side): No submental, no submandibular, no tonsillar, no preauricular, no posterior auricular and no occipital adenopathy present.    She has no cervical adenopathy.       Right cervical: No superficial cervical, no deep cervical and no posterior cervical adenopathy present.      Left cervical: No superficial cervical, no deep cervical and no posterior cervical adenopathy present.  Neurological: She is alert and oriented to person, place, and time. She has normal strength. She is not disoriented. She displays no atrophy, no tremor  and normal reflexes. No cranial nerve deficit or sensory deficit. She exhibits normal muscle tone. She displays no seizure activity. Coordination and gait normal. GCS eye subscore is 4. GCS verbal subscore is 5. GCS motor subscore is 6.  Reflex Scores:      Brachioradialis reflexes are 2+ on the right side and 2+ on the left side.      Patellar reflexes are 2+ on the right side and 2+ on the left side.      Achilles reflexes are 2+ on the right side and 2+ on the left side. On/off exam table; in/out of chair without difficulty; gait sure and steady in hall  Skin: Skin is warm, dry and intact. No abrasion, no bruising, no burn, no ecchymosis, no laceration, no lesion, no petechiae and no rash noted. She is not diaphoretic. No cyanosis or  erythema. No pallor. Nails show no clubbing.  Psychiatric: She has a normal mood and affect. Her speech is normal and behavior is normal. Judgment and thought content normal. Cognition and memory are normal.  Nursing note and vitals reviewed.         Assessment & Plan:  A-left shoulder strain acute initial, low back strain initial  P-cyclobenazeprine/flexeril 10mg  po t1/2-1 po qhs prn muscle spasms #30 RF0 dispensed from [facility].  Ibuprofen 800mg  po BID prn pain #30 RF0 take with food dispensed from [facility].  Avoid alcohol intake and driving while taking cyclobenazeprine/flexeril as drowsiness common side effect.  Slow position changes as medication also lower blood pressure. biofreeze topical QID prn pain #4 UD given to patient from clinic stock.  Home stretches demonstrated to patient-e.g. Arm circles, walking up wall, chest stretches, neck AROM, chin tucks, knee to chest and rock side to side on back. Self massage or professional prn, foam roller use or tennis/racquetball.  Heat/cryotherapy 15 minutes QID prn.  Trial [facility] 1 applied and another given to patient for use tomorrow from clinic stock neck and lumbar  Consider physical therapy referral if no improvement with prescribed therapy from [facility] and/or chiropractic care.  Ensure ergonomics correct desk at work avoid repetitive motions if possible/holding phone/laptop in hand use desk/stand and/or break up lifting items into smaller loads/weights.  Patient was instructed to rest, ice, and ROM exercises.  Activity as tolerated.   Follow up if symptoms persist or worsen especially if loss of bowel/bladder control, arm/leg weakness and/or saddle paresthesias.  Exitcare handout on muscle spasms, shoulder strain, low back strain and rehab exercises given to patient.  Patient verbalized agreement and understanding of treatment plan and had no further questions at this time.  P2:  Injury Prevention and Fitness.

## 2016-12-06 NOTE — Patient Instructions (Addendum)
Shoulder Sprain A shoulder sprain is a partial or complete tear in one of the tough, fiber-like tissues (ligaments) in the shoulder. The ligaments in the shoulder help to hold the shoulder in place. What are the causes? This condition may be caused by:  A fall.  A hit to the shoulder.  A twist of the arm.  What increases the risk? This condition is more likely to develop in:  People who play sports.  People who have problems with balance or coordination.  What are the signs or symptoms? Symptoms of this condition include:  Pain when moving the shoulder.  Limited ability to move the shoulder.  Swelling and tenderness on top of the shoulder.  Warmth in the shoulder.  A change in the shape of the shoulder.  Redness or bruising on the shoulder.  How is this diagnosed? This condition is diagnosed with a physical exam. During the exam, you may be asked to do simple exercises with your shoulder. You may also have imaging tests, such as X-rays, MRI, or a CT scan. These tests can show how severe the sprain is. How is this treated? This condition may be treated with:  Rest.  Pain medicine.  Ice.  A sling or brace. This is used to keep the arm still while the shoulder is healing.  Physical therapy or rehabilitation exercises. These help to improve the range of motion and strength of the shoulder.  Surgery (rare). Surgery may be needed if the sprain caused a joint to become unstable. Surgery may also be needed to reduce pain.  Some people may develop ongoing shoulder pain or lose some range of motion in the shoulder. However, most people do not develop long-term problems. Follow these instructions at home:  Rest.  Ask your health care provider when it is safe for you to drive if you have a sling or brace on your shoulder.  Take over-the-counter and prescription medicines only as told by your health care provider.  If directed, apply ice to the area: ? Put ice in a  plastic bag. ? Place a towel between your skin and the bag. ? Leave the ice on for 20 minutes, 2-3 times per day.  If you were given a shoulder sling or brace: ? Wear it as told. ? Remove it to shower or bathe. ? Move your arm only as much as told by your health care provider, but keep your hand moving to prevent swelling.  If you were shown how to do any exercises, do them as told by your health care provider.  Keep all follow-up visits as told by your health care provider. This is important. Contact a health care provider if:  Your pain gets worse.  Your pain is not relieved with medicines.  You have increased redness or swelling. Get help right away if:  You have a fever.  You cannot move your arm or shoulder.  You develop severe numbness or tingling in your arm, hand, or fingers.  Your arm, hand, or fingers turn blue, white, or gray and feel cold. This information is not intended to replace advice given to you by your health care provider. Make sure you discuss any questions you have with your health care provider. Document Released: 07/01/2008 Document Revised: 10/09/2015 Document Reviewed: 06/07/2014 Elsevier Interactive Patient Education  2017 [employer] Ask your health care provider which exercises are safe for you. Do exercises exactly as told by your health care provider and adjust them  as directed. It is normal to feel mild stretching, pulling, tightness, or discomfort as you do these exercises, but you should stop right away if you feel sudden pain or your pain gets worse. Do not begin these exercises until told by your health care provider. Stretching and range of motion exercises These exercises warm up your muscles and joints and improve the movement and flexibility of your back. These exercises also help to relieve pain, numbness, and tingling. Exercise A: Lumbar rotation  1. Lie on your back on a firm surface and bend your  knees. 2. Straighten your arms out to your sides so each arm forms an "L" shape with a side of your body (a 90 degree angle). 3. Slowly move both of your knees to one side of your body until you feel a stretch in your lower back. Try not to let your shoulders move off of the floor. 4. Hold for __________ seconds. 5. Tense your abdominal muscles and slowly move your knees back to the starting position. 6. Repeat this exercise on the other side of your body. Repeat __________ times. Complete this exercise __________ times a day. Exercise B: Prone extension on elbows  1. Lie on your abdomen on a firm surface. 2. Prop yourself up on your elbows. 3. Use your arms to help lift your chest up until you feel a gentle stretch in your abdomen and your lower back. ? This will place some of your body weight on your elbows. If this is uncomfortable, try stacking pillows under your chest. ? Your hips should stay down, against the surface that you are lying on. Keep your hip and back muscles relaxed. 4. Hold for __________ seconds. 5. Slowly relax your upper body and return to the starting position. Repeat __________ times. Complete this exercise __________ times a day. Strengthening exercises These exercises build strength and endurance in your back. Endurance is the ability to use your muscles for a long time, even after they get tired. Exercise C: Pelvic tilt 1. Lie on your back on a firm surface. Bend your knees and keep your feet flat. 2. Tense your abdominal muscles. Tip your pelvis up toward the ceiling and flatten your lower back into the floor. ? To help with this exercise, you may place a small towel under your lower back and try to push your back into the towel. 3. Hold for __________ seconds. 4. Let your muscles relax completely before you repeat this exercise. Repeat __________ times. Complete this exercise __________ times a day. Exercise D: Alternating arm and leg raises  1. Get on your  hands and knees on a firm surface. If you are on a hard floor, you may want to use padding to cushion your knees, such as an exercise mat. 2. Line up your arms and legs. Your hands should be below your shoulders, and your knees should be below your hips. 3. Lift your left leg behind you. At the same time, raise your right arm and straighten it in front of you. ? Do not lift your leg higher than your hip. ? Do not lift your arm higher than your shoulder. ? Keep your abdominal and back muscles tight. ? Keep your hips facing the ground. ? Do not arch your back. ? Keep your balance carefully, and do not hold your breath. 4. Hold for __________ seconds. 5. Slowly return to the starting position and repeat with your right leg and your left arm. Repeat __________ times. Complete this exercise __________  times a day. Exercise E: Abdominal set with straight leg raise  1. Lie on your back on a firm surface. 2. Bend one of your knees and keep your other leg straight. 3. Tense your abdominal muscles and lift your straight leg up, 4-6 inches (10-15 cm) off the ground. 4. Keep your abdominal muscles tight and hold for __________ seconds. ? Do not hold your breath. ? Do not arch your back. Keep it flat against the ground. 5. Keep your abdominal muscles tense as you slowly lower your leg back to the starting position. 6. Repeat with your other leg. Repeat __________ times. Complete this exercise __________ times a day. Posture and body mechanics  Body mechanics refers to the movements and positions of your body while you do your daily activities. Posture is part of body mechanics. Good posture and healthy body mechanics can help to relieve stress in your body's tissues and joints. Good posture means that your spine is in its natural S-curve position (your spine is neutral), your shoulders are pulled back slightly, and your head is not tipped forward. The following are general guidelines for applying  improved posture and body mechanics to your everyday activities. Standing   When standing, keep your spine neutral and your feet about hip-width apart. Keep a slight bend in your knees. Your ears, shoulders, and hips should line up.  When you do a task in which you stand in one place for a long time, place one foot up on a stable object that is 2-4 inches (5-10 cm) high, such as a footstool. This helps keep your spine neutral. Sitting   When sitting, keep your spine neutral and keep your feet flat on the floor. Use a footrest, if necessary, and keep your thighs parallel to the floor. Avoid rounding your shoulders, and avoid tilting your head forward.  When working at a desk or a computer, keep your desk at a height where your hands are slightly lower than your elbows. Slide your chair under your desk so you are close enough to maintain good posture.  When working at a computer, place your monitor at a height where you are looking straight ahead and you do not have to tilt your head forward or downward to look at the screen. Resting   When lying down and resting, avoid positions that are most painful for you.  If you have pain with activities such as sitting, bending, stooping, or squatting (flexion-based activities), lie in a position in which your body does not bend very much. For example, avoid curling up on your side with your arms and knees near your chest (fetal position).  If you have pain with activities such as standing for a long time or reaching with your arms (extension-based activities), lie with your spine in a neutral position and bend your knees slightly. Try the following positions:  Lying on your side with a pillow between your knees.  Lying on your back with a pillow under your knees. Lifting   When lifting objects, keep your feet at least shoulder-width apart and tighten your abdominal muscles.  Bend your knees and hips and keep your spine neutral. It is important  to lift using the strength of your legs, not your back. Do not lock your knees straight out.  Always ask for help to lift heavy or awkward objects. This information is not intended to replace advice given to you by your health care provider. Make sure you discuss any questions you have with  your health care provider. Document Released: 02/12/2005 Document Revised: 10/20/2015 Document Reviewed: 11/24/2014 Elsevier Interactive Patient Education  2018 [place].  Shoulder Stretch    Bring arms behind back and either interlace fingers or hold opposite wrist. Straighten arms and pull shoulders back. Hold position for ___ breaths. Repeat ___ times. Do ___ times per day.  Copyright  [employer]. All rights reserved.   Biceps Tendon Tendinitis (Distal) Rehab Ask your health care provider which exercises are safe for you. Do exercises exactly as told by your health care provider and adjust them as directed. It is normal to feel mild stretching, pulling, tightness, or discomfort as you do these exercises, but you should stop right away if you feel sudden pain or your pain gets worse.Do not begin these exercises until told by your health care provider. Stretching and range of motion exercises These exercises warm up your muscles and joints and improve the movement and flexibility of your arm. These exercises can also help to relieve pain and stiffness. Exercise A: Supination, active-assisted 1. Stand or sit with your left / right elbow bent to an "L" shape (90 degrees). 2. Rotate your palm up until you cannot rotate it anymore. Then, use your other hand to help rotate your left / right forearm more. 3. Hold this position for __________ seconds. 4. Slowly return to the starting position. Repeat __________ times. Complete this exercise __________ times a day. Exercise B: Pronation, active-assisted  1. Stand or sit with your left / right elbow bent to an "L" shape (90 degrees). 2. Rotate your left /  right palm down until you cannot rotate it anymore. Then, use your other hand to help rotate your left / right forearm more. 3. Hold this position for __________ seconds. 4. Slowly return to the starting position. Repeat __________ times. Complete this exercise __________ times a day. Strengthening exercises These exercises build strength and endurance in your arm and shoulder. Endurance is the ability to use your muscles for a long time, even after your muscles get tired. Exercise C: Supination  1. Sit with your left / right forearm supported on a table. Your elbow should be at waist height. 2. Rest your hand over the edge of the table, palm-down. 3. Gently grasp a lightweight hammer near the head. As this exercise gets easier for you, try holding the hammer farther down the handle. 4. Without moving your elbow, slowly rotate your palm up so it faces the ceiling. 5. Hold this position for__________ seconds. 6. Slowly return to the starting position. Repeat __________ times. Complete this exercise __________ times a day. Exercise D: Pronation  1. Sit with your left / right forearm supported on a table. Your elbow should be at waist height. 2. Rest your hand over the edge of the table, palm-up. 3. Gently grasp a lightweight hammer near the head. As this exercise gets easier for you, try holding the hammer farther down the handle. 4. Without moving your elbow, slowly rotate your palm down. 5. Hold this position for __________ seconds. 6. Slowly return to the starting position. Repeat __________ times. Complete this exercise __________ times a day. Exercise E: Elbow flexion, supinated  1. Sit on a stable chair without armrests, or stand. 2. If directed, hold a __________ weight in your left / right hand, or hold an exercise band with both hands. Your palms should face up toward the ceiling at the starting position. 3. Bend your left / right elbow and move your hand up toward  your shoulder.  Keep your other arm straight down, in the starting position. 4. Slowly return to the starting position. Repeat __________ times. Complete this exercise __________ times a day. Exercise F: Elbow extension  1. Lie on your back. 2. Hold a __________ weight in your left / right hand. 3. Bend your left / right elbow to an "L" shape (90 degrees) so your elbow is pointed up to the ceiling and the weight is overhead. 4. Straighten your elbow, raising your hand toward the ceiling. Use your other hand to support your left / right upper arm and to keep it still. 5. Slowly return to the starting position. Repeat __________ times. Complete this exercise __________ times a day. This information is not intended to replace advice given to you by your health care provider. Make sure you discuss any questions you have with your health care provider. Document Released: 02/12/2005 Document Revised: 10/20/2015 Document Reviewed: 01/21/2015 Elsevier Interactive Patient Education  [employer].

## 2016-12-18 ENCOUNTER — Ambulatory Visit: Payer: Self-pay | Admitting: Registered Nurse

## 2016-12-18 VITALS — BP 132/89 | HR 74 | Temp 98.2°F | Resp 18

## 2016-12-18 DIAGNOSIS — J0101 Acute recurrent maxillary sinusitis: Secondary | ICD-10-CM

## 2016-12-18 DIAGNOSIS — M7522 Bicipital tendinitis, left shoulder: Secondary | ICD-10-CM

## 2016-12-18 DIAGNOSIS — S46812D Strain of other muscles, fascia and tendons at shoulder and upper arm level, left arm, subsequent encounter: Secondary | ICD-10-CM

## 2016-12-18 MED ORDER — FLUTICASONE PROPIONATE 50 MCG/ACT NA SUSP: 1.0000 | Freq: Two times a day (BID) | NASAL | 0 refills | Status: DC

## 2016-12-18 MED ORDER — PREDNISONE 10 MG (21) PO TBPK: ORAL_TABLET | ORAL | 0 refills | Status: DC

## 2016-12-18 MED ORDER — DOXYCYCLINE HYCLATE 100 MG PO TABS: 100.0000 mg | ORAL_TABLET | Freq: Two times a day (BID) | ORAL | 0 refills | Status: DC

## 2016-12-18 MED ORDER — SALINE SPRAY 0.65 % NA SOLN: 2.0000 | NASAL | 0 refills | Status: DC

## 2016-12-18 NOTE — Patient Instructions (Signed)
Sinus Rinse What is a sinus rinse? A sinus rinse is a simple home treatment that is used to rinse your sinuses with a sterile mixture of salt and water (saline solution). Sinuses are air-filled spaces in your skull behind the bones of your face and forehead that open into your nasal cavity. You will use the following:  Saline solution.  Neti pot or spray bottle. This releases the saline solution into your nose and through your sinuses. Neti pots and spray bottles can be purchased at [employer], a health food store, or online.  When would I do a sinus rinse? A sinus rinse can help to clear mucus, dirt, dust, or pollen from the nasal cavity. You may do a sinus rinse when you have a cold, a virus, nasal allergy symptoms, a sinus infection, or stuffiness in the nose or sinuses. If you are considering a sinus rinse:  Ask your child's health care provider before performing a sinus rinse on your child.  Do not do a sinus rinse if you have had ear or nasal surgery, ear infection, or blocked ears.  How do I do a sinus rinse?  Wash your hands.  Disinfect your device according to the directions provided and then dry it.  Use the solution that comes with your device or one that is sold separately in stores. Follow the mixing directions on the package.  Fill your device with the amount of saline solution as directed by the device instructions.  Stand over a sink and tilt your head sideways over the sink.  Place the spout of the device in your upper nostril (the one closer to the ceiling).  Gently pour or squeeze the saline solution into the nasal cavity. The liquid should drain to the lower nostril if you are not overly congested.  Gently blow your nose. Blowing too hard may cause ear pain.  Repeat in the other nostril.  Clean and rinse your device with clean water and then air-dry it. Are there risks of a sinus rinse? Sinus rinse is generally very safe and effective. However,  there are a few risks, which include:  A burning sensation in the sinuses. This may happen if you do not make the saline solution as directed. Make sure to follow all directions when making the saline solution.  Infection from contaminated water. This is rare, but possible.  Nasal irritation.  This information is not intended to replace advice given to you by your health care provider. Make sure you discuss any questions you have with your health care provider. Document Released: 09/09/2013 Document Revised: 01/10/2016 Document Reviewed: 06/30/2013 Elsevier Interactive Patient Education  2017 Elsevier Inc. Sinusitis, Adult Sinusitis is soreness and inflammation of your sinuses. Sinuses are hollow spaces in the bones around your face. Your sinuses are located:  Around your eyes.  In the middle of your forehead.  Behind your nose.  In your cheekbones.  Your sinuses and nasal passages are lined with a stringy fluid (mucus). Mucus normally drains out of your sinuses. When your nasal tissues become inflamed or swollen, the mucus can become trapped or blocked so air cannot flow through your sinuses. This allows bacteria, viruses, and funguses to grow, which leads to infection. Sinusitis can develop quickly and last for 7?10 days (acute) or for more than 12 weeks (chronic). Sinusitis often develops after a cold. What are the causes? This condition is caused by anything that creates swelling in the sinuses or stops mucus from draining, including:  Allergies.  Asthma.  Bacterial or viral infection.  Abnormally shaped bones between the nasal passages.  Nasal growths that contain mucus (nasal polyps).  Narrow sinus openings.  Pollutants, such as chemicals or irritants in the air.  A foreign object stuck in the nose.  A fungal infection. This is rare.  What increases the risk? The following factors may make you more likely to develop this condition:  Having allergies or  asthma.  Having had a recent cold or respiratory tract infection.  Having structural deformities or blockages in your nose or sinuses.  Having a weak immune system.  Doing a lot of swimming or diving.  Overusing nasal sprays.  Smoking.  What are the signs or symptoms? The main symptoms of this condition are pain and a feeling of pressure around the affected sinuses. Other symptoms include:  Upper toothache.  Earache.  Headache.  Bad breath.  Decreased sense of smell and taste.  A cough that may get worse at night.  Fatigue.  Fever.  Thick drainage from your nose. The drainage is often green and it may contain pus (purulent).  Stuffy nose or congestion.  Postnasal drip. This is when extra mucus collects in the throat or back of the nose.  Swelling and warmth over the affected sinuses.  Sore throat.  Sensitivity to light.  How is this diagnosed? This condition is diagnosed based on symptoms, a medical history, and a physical exam. To find out if your condition is acute or chronic, your health care provider may:  Look in your nose for signs of nasal polyps.  Tap over the affected sinus to check for signs of infection.  View the inside of your sinuses using an imaging device that has a light attached (endoscope).  If your health care provider suspects that you have chronic sinusitis, you may also:  Be tested for allergies.  Have a sample of mucus taken from your nose (nasal culture) and checked for bacteria.  Have a mucus sample examined to see if your sinusitis is related to an allergy.  If your sinusitis does not respond to treatment and it lasts longer than 8 weeks, you may have an MRI or CT scan to check your sinuses. These scans also help to determine how severe your infection is. In rare cases, a bone biopsy may be done to rule out more serious types of fungal sinus disease. How is this treated? Treatment for sinusitis depends on the cause and  whether your condition is chronic or acute. If a virus is causing your sinusitis, your symptoms will go away on their own within 10 days. You may be given medicines to relieve your symptoms, including:  Topical nasal decongestants. They shrink swollen nasal passages and let mucus drain from your sinuses.  Antihistamines. These drugs block inflammation that is triggered by allergies. This can help to ease swelling in your nose and sinuses.  Topical nasal corticosteroids. These are nasal sprays that ease inflammation and swelling in your nose and sinuses.  Nasal saline washes. These rinses can help to get rid of thick mucus in your nose.  If your condition is caused by bacteria, you will be given an antibiotic medicine. If your condition is caused by a fungus, you will be given an antifungal medicine. Surgery may be needed to correct underlying conditions, such as narrow nasal passages. Surgery may also be needed to remove polyps. Follow these instructions at home: Medicines  Take, use, or apply over-the-counter and prescription medicines only as told by  your health care provider. These may include nasal sprays.  If you were prescribed an antibiotic medicine, take it as told by your health care provider. Do not stop taking the antibiotic even if you start to feel better. Hydrate and Humidify  Drink enough water to keep your urine clear or pale yellow. Staying hydrated will help to thin your mucus.  Use a cool mist humidifier to keep the humidity level in your home above 50%.  Inhale steam for 10-15 minutes, 3-4 times a day or as told by your health care provider. You can do this in the bathroom while a hot shower is running.  Limit your exposure to cool or dry air. Rest  Rest as much as possible.  Sleep with your head raised (elevated).  Make sure to get enough sleep each night. General instructions  Apply a warm, moist washcloth to your face 3-4 times a day or as told by your  health care provider. This will help with discomfort.  Wash your hands often with soap and water to reduce your exposure to viruses and other germs. If soap and water are not available, use hand sanitizer.  Do not smoke. Avoid being around people who are smoking (secondhand smoke).  Keep all follow-up visits as told by your health care provider. This is important. Contact a health care provider if:  You have a fever.  Your symptoms get worse.  Your symptoms do not improve within 10 days. Get help right away if:  You have a severe headache.  You have persistent vomiting.  You have pain or swelling around your face or eyes.  You have vision problems.  You develop confusion.  Your neck is stiff.  You have trouble breathing. This information is not intended to replace advice given to you by your health care provider. Make sure you discuss any questions you have with your health care provider. Document Released: 02/12/2005 Document Revised: 10/09/2015 Document Reviewed: 12/08/2014 Elsevier Interactive Patient Education  2017 Elsevier Inc. Biceps Tendon Tendinitis (Proximal) and Tenosynovitis Rehab Ask your health care provider which exercises are safe for you. Do exercises exactly as told by your health care provider and adjust them as directed. It is normal to feel mild stretching, pulling, tightness, or discomfort as you do these exercises, but you should stop right away if you feel sudden pain or your pain gets worse.Do not begin these exercises until told by your health care provider. Stretching and range of motion exercises These exercises warm up your muscles and joints and improve the movement and flexibility of your arm and shoulder. These exercises also help to relieve pain and stiffness. Exercise A: Shoulder flexion  1. Stand facing a wall. Put your left / right hand on the wall. 2. Slide your left / right hand up the wall. Stop when you feel a stretch in your shoulder,  or when you reach the angle that is recommended by your health care provider. ? Use your other hand to help raise your arm, if needed. ? As your hand gets higher, you may need to step closer to the wall. ? Avoid shrugging your shoulder while you raise your arm. To do this, keep your shoulder blade tucked down toward your spine. 3. Hold for __________ seconds. 4. Slowly return to the starting position. Use your other arm to help, if needed. Repeat __________ times. Complete this exercise __________ times a day. Exercise B: Posterior capsule stretch ( passive horizontal adduction) 1. Sit or stand and pull  your left / right elbow across your chest, toward your other shoulder. Stop when you feel a gentle stretch in the back of your shoulder and upper arm. ? Keep your arm at shoulder height. ? Keep your arm as close to your body as you comfortably can. 2. Hold for __________ seconds. 3. Slowly return to the starting position. Repeat __________ times. Complete this exercise __________ times a day. Strengthening exercises These exercises build strength and endurance in your arm and shoulder. Endurance is the ability to use your muscles for a long time, even after your muscles get tired. Exercise C: Elbow flexion, supinated  1. Sit on a stable chair without armrests, or stand. 2. If directed, hold a __________ weight in your left / right hand, or hold an exercise band with both hands. Your palms should face up toward the ceiling at the starting position. 3. Bend your left / right elbow and move your hand up toward your shoulder. Keep your other arm straight down, in the starting position. 4. Slowly return to the starting position. Repeat __________ times. Complete this exercise __________ times a day. Exercise D: Scapular protraction, supine  1. Lie on your back on a firm surface. If directed, hold a __________ weight in your left / right hand. 2. Raise your left / right arm straight into the air  so your hand is directly above your shoulder joint. 3. Push the weight into the air so your shoulder lifts off of the surface that you are lying on. Do not move your head, neck, or back. 4. Hold for __________ seconds. 5. Slowly return to the starting position. Let your muscles relax completely before you repeat this exercise. Repeat __________ times. Complete this exercise __________ times a day. Exercise E: Scapular retraction  1. Sit in a stable chair without armrests, or stand. 2. Secure an exercise band to a stable object in front of you so the band is at shoulder height. 3. Hold one end of the exercise band in each hand. 4. Squeeze your shoulder blades together and move your elbows slightly behind you. Do not shrug your shoulders. 5. Hold for __________ seconds. 6. Slowly return to the starting position. Repeat __________ times. Complete this exercise __________ times a day. This information is not intended to replace advice given to you by your health care provider. Make sure you discuss any questions you have with your health care provider. Document Released: 02/12/2005 Document Revised: 10/20/2015 Document Reviewed: 01/21/2015 Elsevier Interactive Patient Education  [employer].

## 2016-12-18 NOTE — Progress Notes (Signed)
Subjective:    Patient ID: Jamie Kelley, female    DOB: September 26, 1955, 61 y.o.   MRN: [id number]  61y/o african [place] established female patient here for re-evaluation left arm pain not improving with home exercises and NSAIDS.  Still feels a little weak with overhead activities.  Also headache, sinus pain/pressure, cough nonproductive.   think I have sinus infection going on again  Doxycycline and prednisone worked well for patient in July 2018.  Flonase, saline nasal and alternating zyrtec/allegra not helping symptoms.        Review of Systems  Constitutional: Positive for fatigue. Negative for activity change, appetite change, chills, diaphoresis, fever and unexpected weight change.  HENT: Positive for congestion, postnasal drip, rhinorrhea, sinus pain and sinus pressure. Negative for dental problem, drooling, ear discharge, ear pain, facial swelling, hearing loss, mouth sores, nosebleeds, sneezing, sore throat, tinnitus, trouble swallowing and voice change.   Eyes: Negative for photophobia, pain, discharge, redness, itching and visual disturbance.  Respiratory: Positive for cough. Negative for choking, chest tightness, shortness of breath, wheezing and stridor.   Cardiovascular: Negative for chest pain, palpitations and leg swelling.  Gastrointestinal: Negative for abdominal distention, abdominal pain, blood in stool, constipation, diarrhea, nausea and vomiting.  Endocrine: Negative for cold intolerance and heat intolerance.  Genitourinary: Negative for difficulty urinating, dysuria and hematuria.  Musculoskeletal: Positive for myalgias. Negative for arthralgias, back pain, gait problem, joint swelling, neck pain and neck stiffness.  Skin: Negative for color change, pallor, rash and wound.  Allergic/Immunologic: Positive for environmental allergies. Negative for food allergies.  Neurological: Positive for headaches. Negative for dizziness, tremors, seizures, syncope, facial asymmetry,  speech difficulty, weakness, light-headedness and numbness.  Hematological: Negative for adenopathy. Does not bruise/bleed easily.  Psychiatric/Behavioral: Negative for agitation, behavioral problems, confusion and sleep disturbance.       Objective:   Physical Exam  Constitutional: She is oriented to person, place, and time. Vital signs are normal. She appears well-developed and well-nourished. She is active and cooperative.  Non-toxic appearance. She does not have a sickly appearance. She appears ill. No distress.  HENT:  Head: Normocephalic and atraumatic.  Right Ear: Hearing, external ear and ear canal normal. A middle ear effusion is present.  Left Ear: Hearing, external ear and ear canal normal. A middle ear effusion is present.  Nose: Mucosal edema and rhinorrhea present. No nose lacerations, sinus tenderness, nasal deformity, septal deviation or nasal septal hematoma. No epistaxis.  No foreign bodies. Right sinus exhibits maxillary sinus tenderness and frontal sinus tenderness. Left sinus exhibits maxillary sinus tenderness and frontal sinus tenderness.  Mouth/Throat: Uvula is midline and mucous membranes are normal. Mucous membranes are not pale, not dry and not cyanotic. She does not have dentures. No oral lesions. No trismus in the jaw. Normal dentition. No dental abscesses, uvula swelling, lacerations or dental caries. Posterior oropharyngeal edema and posterior oropharyngeal erythema present. No oropharyngeal exudate or tonsillar abscesses.  Bilateral allergic shiners; bilateral TMs air fluid level clear; cobblestoning posterior pharynx; maxillary greater than frontal sinuses TTP; bilateral nasal turbinates edema/erythema scant yellow mucous clear  Eyes: Pupils are equal, round, and reactive to light. Conjunctivae, EOM and lids are normal. Right eye exhibits no chemosis, no discharge, no exudate and no hordeolum. No foreign body present in the right eye. Left eye exhibits no chemosis, no  discharge, no exudate and no hordeolum. No foreign body present in the left eye. Right conjunctiva is not injected. Right conjunctiva has no hemorrhage. Left conjunctiva is not injected. Left  conjunctiva has no hemorrhage. No scleral icterus. Right eye exhibits normal extraocular motion and no nystagmus. Left eye exhibits normal extraocular motion and no nystagmus. Right pupil is round and reactive. Left pupil is round and reactive. Pupils are equal.  Neck: Trachea normal, normal range of motion and phonation normal. Neck supple. No tracheal tenderness, no spinous process tenderness and no muscular tenderness present. No neck rigidity. No tracheal deviation, no edema, no erythema and normal range of motion present. No thyroid mass and no thyromegaly present.  Cardiovascular: Normal rate, regular rhythm, S1 normal, S2 normal, normal heart sounds and intact distal pulses.  PMI is not displaced.  Exam reveals no gallop and no friction rub.   No murmur heard. Pulses:      Radial pulses are 2+ on the right side, and 2+ on the left side.  Pulmonary/Chest: Effort normal and breath sounds normal. No accessory muscle usage or stridor. No respiratory distress. She has no decreased breath sounds. She has no wheezes. She has no rhonchi. She has no rales. She exhibits no tenderness.  Spoke full sentences without difficulty; infrequent dry cough noted in exam room  Abdominal: Soft. Normal appearance. She exhibits no distension, no fluid wave and no ascites. There is no rigidity and no guarding.  Musculoskeletal: Normal range of motion.       Right shoulder: Normal.       Left shoulder: Normal.       Right elbow: Normal.      Left elbow: Normal.       Right wrist: Normal.       Left wrist: Normal.       Right hip: Normal.       Left hip: Normal.       Right knee: Normal.       Left knee: Normal.       Cervical back: Normal.       Thoracic back: Normal.       Lumbar back: Normal.       Right upper arm:  Normal.       Left upper arm: She exhibits tenderness, swelling and edema. She exhibits no bony tenderness, no deformity and no laceration.       Right forearm: Normal.       Left forearm: Normal.       Arms:      Right hand: Normal.       Left hand: Normal.  Distal deltoid lateral and proximal biceps TTP; deltoid 1-2+/4 nonpitting edema no defect palpated wither biceps tendon or deltoid; pain with reaching overhead and slow movement shoulder to head level then easier; negative atchley scratch/neers  Lymphadenopathy:       Head (right side): No submental, no submandibular, no tonsillar, no preauricular, no posterior auricular and no occipital adenopathy present.       Head (left side): No submental, no submandibular, no tonsillar, no preauricular, no posterior auricular and no occipital adenopathy present.    She has no cervical adenopathy.       Right cervical: No superficial cervical, no deep cervical and no posterior cervical adenopathy present.      Left cervical: No superficial cervical, no deep cervical and no posterior cervical adenopathy present.  Neurological: She is alert and oriented to person, place, and time. She has normal strength. She is not disoriented. She displays no atrophy and no tremor. No cranial nerve deficit or sensory deficit. She exhibits normal muscle tone. She displays no seizure activity. Coordination and gait  normal. GCS eye subscore is 4. GCS verbal subscore is 5. GCS motor subscore is 6.  Bilateral hand grasp equal 5/5; on/off exam table; in/out of chair without difficulty; gait sure and steady in hallway  Skin: Skin is warm and intact. No abrasion, no bruising, no burn, no ecchymosis, no laceration, no lesion, no petechiae and no rash noted. She is not diaphoretic. No cyanosis or erythema. No pallor. Nails show no clubbing.  Facial and neck skin slightly moist/sweaty  Psychiatric: She has a normal mood and affect. Her speech is normal and behavior is normal.  Judgment and thought content normal. Cognition and memory are normal.  Nursing note and vitals reviewed.         Assessment & Plan:  A-Left arm strain and left biceps tendonitis subsequent encounter and maxillary sinusitis recurrent  P- Patient unsure if lifting at work or home caused arm pain  think it was a combination of both.  Continue Ibuprofen 800mg  po BID prn pain #30 RF0 take with food  Home stretches demonstrated to patient-e.g. Arm circles, walking up wall, chest stretches, neck AROM, chin tucks, knee to chest and rock side to side on back. Self massage or professional prn, foam roller use or tennis/racquetball.  Heat/cryotherapy 15 minutes QID prn.  Trial thermacare 1 applied and another given to patient for use tomorrow from clinic stock neck and lumbar Request physical therapy referral from [facility]/follow up with RN Hildred Alamin to see if coverage with worker compensation. Ensure ergonomics correct desk at work avoid repetitive motions if possible/holding phone/laptop in hand use desk/stand and/or break up lifting items into smaller loads/weights.  Patient was instructed to rest, ice, and ROM exercises.  Activity as tolerated.   Follow up if symptoms persist or worsen especially if weakness/dropping items/bruising.  Exitcare handout on biceps tendonitis given to patient.    Patient verbalized agreement and understanding of treatment plan and had no further questions at this time.  P2:  [employer].  prednisone taper 10mg  60/50/40/30/20/10mg  po daily with breakfast #21 RF0 dispensed from [facility]  start in 48 hours if no relief with prednisone/taking either allegra or zyrtec daily, nasal saline and flonase BID nasal sprays; doxycycline 100mg  po BID x 10 days #20 RF0 if no relief with flonase/nasal saline/allegra or meclizine.No evidence of systemic bacterial infection, non toxic and well hydrated.  I do not see where any further testing or imaging is necessary at this time.  Has inhaler  albuterol at home 1-2 puffs q4-6h prn wheeze/protracted cough.  Cough lozenges 1 po q2h prn cough.  I will suggest supportive care, rest, good hygiene and encourage the patient to take adequate fluids.  The patient is to return to clinic or EMERGENCY ROOM if symptoms worsen or change significantly.  Exitcare handout on sinusitis and sinus rinse given to patient.  Patient verbalized agreement and understanding of treatment plan and had no further questions at this time.   P2:  Hand washing and cover cough  Supportive treatment.   No evidence of invasive bacterial infection, non toxic and well hydrated.  This is most likely self limiting viral infection.  I do not see where any further testing or imaging is necessary at this time.   I will suggest supportive care, rest, good hygiene and encourage the patient to take adequate fluids.  The patient is to return to clinic or EMERGENCY ROOM if symptoms worsen or change significantly e.g. ear pain, fever, purulent discharge from ears or bleeding.  Exitcare handout  on otitis media with effusion given to patient.  Patient verbalized agreement and understanding of treatment plan.

## 2016-12-20 ENCOUNTER — Telehealth: Payer: Self-pay | Admitting: Registered Nurse

## 2016-12-20 ENCOUNTER — Encounter: Payer: Self-pay | Admitting: Registered Nurse

## 2016-12-20 MED ORDER — ALBUTEROL SULFATE HFA 108 (90 BASE) MCG/ACT IN AERS: 1.0000 | INHALATION_SPRAY | RESPIRATORY_TRACT | 0 refills | Status: DC | PRN

## 2016-12-20 NOTE — Telephone Encounter (Signed)
Patient ran out of albuterol MDI at home requested refill to [facility] on [address] [place], [place].  Rx electronic placed for patient and she was notified to pick up later today at her pharmacy specified above.  Patient verbalized understanding information and instructions and had no further questions at this time.

## 2016-12-24 ENCOUNTER — Other Ambulatory Visit: Payer: Self-pay | Admitting: Family Medicine

## 2016-12-24 DIAGNOSIS — Z1231 Encounter for screening mammogram for malignant neoplasm of breast: Secondary | ICD-10-CM

## 2016-12-27 ENCOUNTER — Ambulatory Visit
Admission: RE | Admit: 2016-12-27 | Discharge: 2016-12-27 | Disposition: A | Discharge status: Home / Self Care | Payer: PRIVATE HEALTH INSURANCE | Source: Ambulatory Visit | Attending: Family Medicine | Admitting: Family Medicine

## 2016-12-27 DIAGNOSIS — Z1231 Encounter for screening mammogram for malignant neoplasm of breast: Secondary | ICD-10-CM | POA: Diagnosis present

## 2017-08-03 ENCOUNTER — Encounter (HOSPITAL_COMMUNITY): Payer: Self-pay

## 2017-08-03 ENCOUNTER — Ambulatory Visit (HOSPITAL_COMMUNITY)
Admission: EM | Admit: 2017-08-03 | Discharge: 2017-08-03 | Disposition: A | Discharge status: Home / Self Care | Payer: PRIVATE HEALTH INSURANCE | Attending: Internal Medicine | Admitting: Internal Medicine

## 2017-08-03 DIAGNOSIS — R05 Cough: Secondary | ICD-10-CM

## 2017-08-03 DIAGNOSIS — J0100 Acute maxillary sinusitis, unspecified: Secondary | ICD-10-CM

## 2017-08-03 DIAGNOSIS — R059 Cough, unspecified: Secondary | ICD-10-CM

## 2017-08-03 MED ORDER — DOXYCYCLINE HYCLATE 100 MG PO CAPS: 100.0000 mg | ORAL_CAPSULE | Freq: Two times a day (BID) | ORAL | 0 refills | Status: DC

## 2017-08-03 NOTE — Discharge Instructions (Addendum)
Take antibiotic as prescribed and to completion Take allegra as needed for symptomatic relief Continue to use ibuprofen or tylenol as needed for symptomatic relief Follow up with PCP next week if symptoms persists

## 2017-08-03 NOTE — ED Provider Notes (Signed)
[facility]   [id number] 08/03/17 Arrival Time: 0932  SUBJECTIVE:  Jamie Kelley is a 62 y.o. female who presents with worsening cough, and sinus congestion for 1 week ago.  Denies positive sick exposure or precipitating event.  Describes cough as intermittent and productive with yellow sputum.  Has tried allegra, and benadryl with temporary relief.  Denies aggravating factors.  Reports previous symptoms in the past and diagnosed with sinusitis.   Complains of sinus pain pressure, nasal congestion, ear fullness, sore throat, and nausea.  Denies fever, chills, fatigue,  SOB, wheezing, chest pain, or changes in bowel or bladder habits.    ROS: As per HPI.  Past Medical History:  Diagnosis Date  . Hay fever   . High blood pressure   . Hyperlipidemia   . Obesity   . Thyroid disease    Past Surgical History:  Procedure Laterality Date  . BIOPSY THYROID     Dr Constance Holster- negative for cancer   No Known Allergies No current facility-administered medications on file prior to encounter.    Current Outpatient Medications on File Prior to Encounter  Medication Sig Dispense Refill  . amLODipine (NORVASC) 10 MG tablet Take 1 tablet (10 mg total) by mouth daily. 90 tablet 1  . atenolol (TENORMIN) 50 MG tablet Take 2 tablets (100 mg total) by mouth daily. 180 tablet 1  . cetirizine (ZYRTEC) 10 MG tablet Take 10 mg by mouth daily as needed for allergies.    Marland Kitchen levothyroxine (SYNTHROID, LEVOTHROID) 50 MCG tablet Take 0.5 tablets (25 mcg total) by mouth daily before breakfast. 90 tablet 1  . simvastatin (ZOCOR) 20 MG tablet Take 1 tablet (20 mg total) by mouth at bedtime. 90 tablet 1  . albuterol (PROVENTIL HFA;VENTOLIN HFA) 108 (90 Base) MCG/ACT inhaler Inhale 1-2 puffs into the lungs every 4 (four) hours as needed for wheezing or shortness of breath. 1 Inhaler 0  . cyclobenzaprine (FLEXERIL) 10 MG tablet Take 1 tablet (10 mg total) by mouth 3 (three) times daily as needed for muscle spasms. 30  tablet 0  . fexofenadine (ALLEGRA) 60 MG tablet Take 60 mg by mouth daily.    . fluticasone (FLONASE) 50 MCG/ACT nasal spray Place 1 spray into both nostrils 2 (two) times daily. 16 g 0  . hydrochlorothiazide (HYDRODIURIL) 25 MG tablet Take 1 tablet (25 mg total) by mouth daily. 90 tablet 1  . predniSONE (STERAPRED UNI-PAK 21 TAB) 10 MG (21) TBPK tablet Taper take 6 pills by mouth day 1; 5 pills day 2; 4 pills day 3; 3 pills day 4; 2 pills day 5 and 1 pill day 6 with breakfast daily 21 tablet 0  . sodium chloride (OCEAN) 0.65 % SOLN nasal spray Place 2 sprays into both nostrils every 2 (two) hours while awake.  0    Social History   Socioeconomic History  . Marital status: Single    Spouse name: Not on file  . Number of children: Not on file  . Years of education: Not on file  . Highest education level: Not on file  Occupational History  . Not on file  Social Needs  . Financial resource strain: Not on file  . Food insecurity:    Worry: Not on file    Inability: Not on file  . Transportation needs:    Medical: Not on file    Non-medical: Not on file  Tobacco Use  . Smoking status: Never Smoker  . Smokeless tobacco: Never Used  Substance and  Sexual Activity  . Alcohol use: No  . Drug use: No  . Sexual activity: Not on file  Lifestyle  . Physical activity:    Days per week: Not on file    Minutes per session: Not on file  . Stress: Not on file  Relationships  . Social connections:    Talks on phone: Not on file    Gets together: Not on file    Attends religious service: Not on file    Active member of club or organization: Not on file    Attends meetings of clubs or organizations: Not on file    Relationship status: Not on file  . Intimate partner violence:    Fear of current or ex partner: Not on file    Emotionally abused: Not on file    Physically abused: Not on file    Forced sexual activity: Not on file  Other Topics Concern  . Not on file  Social History  Narrative  . Not on file   Family History  Problem Relation Age of Onset  . Heart disease Mother   . Hypertension Mother   . Diabetes Mother   . Alzheimer's disease Mother   . Cancer Maternal Aunt        breast  . Heart disease Maternal Aunt   . Hypertension Maternal Aunt   . Breast cancer Maternal Aunt 70  . Hypertension Maternal Uncle   . Heart disease Maternal Uncle   . Heart disease Maternal Grandmother   . Hypertension Maternal Grandmother   . Diabetes Maternal Grandmother      OBJECTIVE:  Vitals:   08/03/17 1736  BP: (!) 151/85  Pulse: 84  Resp: 18  Temp: 98.4 F (36.9 C)  SpO2: 98%     General appearance: AOx3 in no acute distress; appear fatigued HEENT: PERRL.  EOM grossly intact.  Maxillary sinus tenderness; mild clear rhinorrhea; tonsils nonerythematous, uvula midline Neck: supple without LAD Lungs: clear to auscultation bilaterally without adventitious breath sounds Heart: regular rate and rhythm.  Radial pulses 2+ symmetrical bilaterally Skin: warm and dry Psychological: alert and cooperative; normal mood and affect  ASSESSMENT & PLAN:  1. Acute non-recurrent maxillary sinusitis   2. Cough     Meds ordered this encounter  Medications  . doxycycline (VIBRAMYCIN) 100 MG capsule    Sig: Take 1 capsule (100 mg total) by mouth 2 (two) times daily.    Dispense:  20 capsule    Refill:  0    Order Specific Question:   Supervising Provider    Answer:   Wynona Luna [[id number]]    Take antibiotic as prescribed and to completion Take allegra as needed for symptomatic relief Continue to use ibuprofen or tylenol as needed for symptomatic relief Follow up with PCP next week if symptoms persists  Reviewed expectations re: course of current medical issues. Questions answered. Outlined signs and symptoms indicating need for more acute intervention. Patient verbalized understanding. After Visit Summary given.          Lestine Box,  PA-C 08/03/17 1823

## 2017-08-03 NOTE — ED Triage Notes (Signed)
Pt presents with complaints of cough and sore throat x 1 week.

## 2017-08-06 ENCOUNTER — Encounter: Payer: Self-pay | Admitting: Registered Nurse

## 2017-08-06 ENCOUNTER — Ambulatory Visit: Payer: Self-pay | Admitting: Registered Nurse

## 2017-08-06 VITALS — BP 158/98 | HR 82 | Temp 97.9°F

## 2017-08-06 DIAGNOSIS — R1110 Vomiting, unspecified: Secondary | ICD-10-CM

## 2017-08-06 DIAGNOSIS — J0101 Acute recurrent maxillary sinusitis: Secondary | ICD-10-CM

## 2017-08-06 DIAGNOSIS — J069 Acute upper respiratory infection, unspecified: Secondary | ICD-10-CM

## 2017-08-06 DIAGNOSIS — E860 Dehydration: Secondary | ICD-10-CM

## 2017-08-06 DIAGNOSIS — B9789 Other viral agents as the cause of diseases classified elsewhere: Secondary | ICD-10-CM

## 2017-08-06 MED ORDER — PSEUDOEPH-BROMPHEN-DM 30-2-10 MG/5ML PO SYRP: 5.0000 mL | ORAL_SOLUTION | Freq: Four times a day (QID) | ORAL | 0 refills | Status: AC | PRN

## 2017-08-06 MED ORDER — PREDNISONE 10 MG (21) PO TBPK: ORAL_TABLET | ORAL | 0 refills | Status: DC

## 2017-08-06 NOTE — Progress Notes (Signed)
Subjective:    Patient ID: Jamie Kelley, female    DOB: 14-Apr-1955, 62 y.o.   MRN: [id number]  62y/o African-American established female pt c/o sinus infection and cough. Was seen at [facility] 08/03/17. Dx with same. Sx of frontal sinus pain/pressure, nasal congestion, ear fullness, sore throat, nausea, productive cough. Was started on Doxycycline there, and instructed to continue Allegra and use Ibuprofen/Tylenol prn. Reports sore throat not quite as severe, but all other sx unchanged. Also using Flonase BID. Albulterol inh once to twice a day prn. Not using saline spray or rinse. This morning stomach started to feel unsettled after she took doxycycline and she ended up throwing up.  She thinks it was because she didn't eat enough food.  Doesn't feel like she needs steroids at this time.  Has had stomach upset with amoxicillin in the past  Also.  Denied heartburn, blood in stool, emesis, or urine red or black  Does not remember if she has used inhaler or tessalon pearles with success in the past. PA Tamala Julian has prescribed bromphed in the past and she remembered that did work well for her cough.   + sick contacts at work with viral URI and gastritis  Noted in paper chart patient has not filled her chronic Rx since Dec 2018.  Patient reported she is not taking her medications daily especially her "water pill" and she will be needing refills except for hydrochlorothiazide today.  Patient stated she did take her medications as prescribed last night and she typically takes all of her chronic medications prior to bedtime.     Review of Systems  Constitutional: Negative for activity change, appetite change, chills, diaphoresis, fatigue, fever and unexpected weight change.  HENT: Positive for congestion, ear pain, nosebleeds, postnasal drip, rhinorrhea, sinus pressure, sinus pain and sore throat. Negative for dental problem, drooling, ear discharge, facial swelling, hearing loss, mouth sores, sneezing, tinnitus, trouble  swallowing and voice change.   Eyes: Negative for photophobia, pain, discharge, redness, itching and visual disturbance.  Respiratory: Positive for cough. Negative for choking, chest tightness, shortness of breath, wheezing and stridor.   Cardiovascular: Negative for chest pain, palpitations and leg swelling.  Gastrointestinal: Positive for nausea and vomiting. Negative for abdominal distention, abdominal pain, blood in stool, constipation and diarrhea.  Endocrine: Negative for cold intolerance and heat intolerance.  Genitourinary: Negative for difficulty urinating, dysuria and hematuria.  Musculoskeletal: Negative for arthralgias, back pain, gait problem, joint swelling, myalgias, neck pain and neck stiffness.  Skin: Negative for color change, pallor, rash and wound.  Allergic/Immunologic: Positive for environmental allergies. Negative for food allergies.  Neurological: Positive for headaches. Negative for dizziness, tremors, seizures, syncope, facial asymmetry, speech difficulty, weakness, light-headedness and numbness.  Hematological: Negative for adenopathy. Does not bruise/bleed easily.  Psychiatric/Behavioral: Negative for agitation, behavioral problems, confusion and sleep disturbance.       Objective:   Physical Exam  Constitutional: She is oriented to person, place, and time. She appears well-developed and well-nourished. She is active and cooperative.  Non-toxic appearance. She does not have a sickly appearance. She appears ill. No distress.  HENT:  Head: Normocephalic and atraumatic.  Right Ear: Hearing, external ear and ear canal normal. A middle ear effusion is present.  Left Ear: Hearing, external ear and ear canal normal. A middle ear effusion is present.  Nose: Mucosal edema and rhinorrhea present. No nose lacerations, sinus tenderness, nasal deformity, septal deviation or nasal septal hematoma. No epistaxis.  No foreign bodies. Right sinus exhibits maxillary  sinus tenderness  and frontal sinus tenderness. Left sinus exhibits maxillary sinus tenderness and frontal sinus tenderness.  Mouth/Throat: Uvula is midline. Mucous membranes are not pale, dry and not cyanotic. She does not have dentures. No oral lesions. No trismus in the jaw. Normal dentition. No dental abscesses, uvula swelling, lacerations or dental caries. Posterior oropharyngeal edema and posterior oropharyngeal erythema present. No oropharyngeal exudate or tonsillar abscesses.  Maxillary greater than frontal sinuses bilaterally TTP; cobblestoning posterior pharynx; bilateral allergic shiners; nasal turbinates edema/erythema clear discharge bilatreally; bilatateral lower eyelids edema 1+/4 nonpitting; tongue with white coating and mucous membranes tacky pink  Eyes: Pupils are equal, round, and reactive to light. Conjunctivae, EOM and lids are normal. Right eye exhibits no chemosis, no discharge, no exudate and no hordeolum. No foreign body present in the right eye. Left eye exhibits no chemosis, no discharge, no exudate and no hordeolum. No foreign body present in the left eye. Right conjunctiva is not injected. Right conjunctiva has no hemorrhage. Left conjunctiva is not injected. Left conjunctiva has no hemorrhage. No scleral icterus. Right eye exhibits normal extraocular motion and no nystagmus. Left eye exhibits normal extraocular motion and no nystagmus. Right pupil is round and reactive. Left pupil is round and reactive. Pupils are equal.  Neck: Trachea normal, normal range of motion and phonation normal. Neck supple. No tracheal tenderness, no spinous process tenderness and no muscular tenderness present. No neck rigidity. No tracheal deviation, no edema, no erythema and normal range of motion present. No thyroid mass and no thyromegaly present.  Cardiovascular: Normal rate, regular rhythm, S1 normal, S2 normal, normal heart sounds and intact distal pulses. PMI is not displaced. Exam reveals no gallop and no  friction rub.  No murmur heard. Pulmonary/Chest: Effort normal and breath sounds normal. No accessory muscle usage or stridor. No respiratory distress. She has no decreased breath sounds. She has no wheezes. She has no rhonchi. She has no rales. She exhibits no tenderness.  Intermittent nonproductive cough in exam room; spoke full sentences without difficulty  Abdominal: Soft. Normal appearance. She exhibits no shifting dullness, no distension, no pulsatile liver, no fluid wave, no abdominal bruit, no ascites, no pulsatile midline mass and no mass. Bowel sounds are decreased. There is no hepatosplenomegaly. There is no tenderness. There is no rigidity, no rebound, no guarding, no CVA tenderness, no tenderness at McBurney's point and negative Murphy's sign. No hernia.  Hypoactive bowel sounds x 4 quads; dull to percussion x  4 quads  Musculoskeletal: Normal range of motion. She exhibits no edema or tenderness.       Right shoulder: Normal.       Left shoulder: Normal.       Right elbow: Normal.      Left elbow: Normal.       Right hip: Normal.       Left hip: Normal.       Right knee: Normal.       Left knee: Normal.       Cervical back: Normal.       Thoracic back: Normal.       Lumbar back: Normal.       Right hand: Normal.       Left hand: Normal.  Lymphadenopathy:       Head (right side): No submental, no submandibular, no tonsillar, no preauricular, no posterior auricular and no occipital adenopathy present.       Head (left side): No submental, no submandibular, no tonsillar, no preauricular, no posterior  auricular and no occipital adenopathy present.    She has no cervical adenopathy.       Right cervical: No superficial cervical, no deep cervical and no posterior cervical adenopathy present.      Left cervical: No superficial cervical, no deep cervical and no posterior cervical adenopathy present.  Neurological: She is alert and oriented to person, place, and time. She has normal  strength. She is not disoriented. She displays no atrophy and no tremor. No cranial nerve deficit or sensory deficit. She exhibits normal muscle tone. She displays no seizure activity. Coordination and gait normal. GCS eye subscore is 4. GCS verbal subscore is 5. GCS motor subscore is 6.  In/out of chair;on/off exam table without difficulty; standing to sitting to supine smoothly and quickly and reverse on exam table; gait sure and steady in hallway  Skin: Skin is warm and intact. Capillary refill takes less than 2 seconds. No abrasion, no bruising, no burn, no ecchymosis, no laceration, no lesion, no petechiae and no rash noted. She is not diaphoretic. No cyanosis or erythema. No pallor. Nails show no clubbing.  Facial and neck skin slightly moist but patient reported she has been wiping face and neck with cool compress prior to my arrival in exam room  Psychiatric: She has a normal mood and affect. Her speech is normal and behavior is normal. Judgment and thought content normal. She is not actively hallucinating. Cognition and memory are normal. She is attentive.  Nursing note and vitals reviewed.         Assessment & Plan:  A-viral URI with cough; acute recurrent maxillary sinusitis, vomiting, dehydration, hypertension  P-has Rx at home for vomiting medication take as prescribed.  discussed hold po x 1 hour now then clear liquids for 24 hours after vomiting.   Discussed she is currently dehydrated tongue and mucous membranes dry. Discussed with patient if she throws up at work again I recommend she speak with supervisor and go home to rest and hydrate.   In one hour sips of liquids and popsicles to stay hyrdated  doxycycline related n/v or viral gastroenteritis that is predominant in community and other employees here at [employer] in past 2 weeks Patient does not want to stop doxycycline  Hx intolerance in the past with doxycycline I have recommended clear fluids and bland diet. Avoid  dairy/spicy, fried and large portions of meat while having nausea. If vomiting hold po intake x 1 hour. Then sips clear fluids like broths, ginger ale, power ade, gatorade, pedialyte may advance to soft/bland if no vomiting x 24 hours and appetite returned otherwise hydration main focus. Return to the clinic if symptoms persist or worsen; I have alerted the patient to call if high fever, marked weakness, fainting, increased abdominal pain, blood in stool or vomit (red or black). ER or UC for IV fluids if unable to tolerate po intake and no voiding x 8 hours or urine orange/brown/tea colored Exitcare handout on miild dehydration, gastroenteritis . Patient verbalized agreement and understanding of treatment plan and had no further questions at this time.   Continue flonase 1 spray each nostril BID, saline 2 sprays each nostril q2h wa prn congestion given 1 bottle from clinic stock increase frequency and amount of use  Finish doxycycline, take OTC anthistamine of choice daily e.g. Claritin/zyrtec 10mg  po daily.  Denied personal or family history of ENT cancer.  Shower BID especially prior to bed. No evidence of systemic bacterial infection, non toxic and well hydrated.  I do not see where any further testing or imaging is necessary at this time.   I will suggest supportive care, rest, good hygiene and encourage the patient to take adequate fluids.  The patient is to return to clinic or EMERGENCY ROOM if symptoms worsen or change significantly.  Exitcare handout on sinusitis and sinus rinse given to patient.  Patient verbalized agreement and understanding of treatment plan and had no further questions at this time.   P2:  Hand washing and cover cough    Finish Doxycycline 100mg  po BID as prescribed by urgent care Discussed to use sunscreen/protective clothing as increased risk of sunburn and most common side effect stomach upset take medication with food. Cough lozenges po q2h prn cough given  Prednisone taper 10mg   (60/50/40/30/20/10mg ) po daily with breakfast #21 RF0 dispensed from [facility].  Discussed possible side effects increased/decreased appetite, difficulty sleeping, increased blood sugar, increased blood pressure and heart rate.  Albuterol MDI 78mcg 1-2 puffs po q4-6h prn protracted cough/wheeze at home restart use  side effect increased heart rate. Bronchitis simple, community acquired, may have started as viral (probably respiratory syncytial, parainfluenza, influenza, or adenovirus), but now evidence of acute purulent bronchitis with resultant bronchial edema and mucus formation.  Viruses are the most common cause of bronchial inflammation in otherwise healthy adults with acute bronchitis.  The appearance of sputum is not predictive of whether a bacterial infection is present.  Purulent sputum is most often caused by viral infections.  There are a small portion of those caused by non-viral agents being Mycoplama pneumonia.  Microscopic examination or C&S of sputum in the healthy adult with acute bronchitis is generally not helpful (usually negative or normal respiratory flora) other considerations being cough from upper respiratory tract infections, sinusitis or allergic syndromes (mild asthma or viral pneumonia).  Differential Diagnoses:  reactive airway disease (asthma, allergic aspergillosis (eosinophilia), chronic bronchitis, respiratory infection (sinusitis, common cold, pneumonia), congestive heart failure, reflux esophagitis, bronchogenic tumor, aspiration syndromes and/or exposure to pulmonary irritants/smoke. 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  Without high fever, severe dyspnea, lack of physical findings or other risk factors, I will hold on a chest radiograph and CBC at this time.  I discussed that approximately 50% of patients with acute bronchitis have a cough that lasts up to three weeks, and 25% for over a month.  Tylenol 500mg  one to two tablets every four  to six hours as needed for fever or myalgias.  No aspirin. Exitcare handout on bronchitis and inhaler use given to patient.  ER if hemopthysis, SOB, worst chest pain of life.   Patient instructed to follow up in one week or sooner if symptoms worsen.  Patient verbalized agreement and understanding of treatment plan.  P2:  hand washing and cover cough  bromphed DM 30/2/10mg  per 98ml 21ml po QID prn cough #120 RF0 electronic Rx to her pharmacy of choice  Reinforced taking her chronic medications daily as prescribed consistently  Repeat blood pressures with RN Hildred Alamin especially after off acute medications but may repeat if feeling worse sooner.  Patient had not filled her amlodipine 10mg  po daily, hydrochlorothiazide 25mg  po daily, Atenolol 100mg  po daily, simvastatin 20mg  po daily since Dec 2018.  Discussed new Rx required for next fill Sep 2019 and follow up with PCM.   Filled from [facility] except hydrochlorothiazide which she stated she has enough left at home right now from not taking it daily.   Patient verbalized understanding  information/instructions and had no further questions at this time.

## 2017-08-07 NOTE — Patient Instructions (Signed)
Dehydration, Adult Dehydration is when there is not enough fluid or water in your body. This happens when you lose more fluids than you take in. Dehydration can range from mild to very bad. It should be treated right away to keep it from getting very bad. Symptoms of mild dehydration may include:  Thirst.  Dry lips.  Slightly dry mouth.  Dry, warm skin.  Dizziness. Symptoms of moderate dehydration may include:  Very dry mouth.  Muscle cramps.  Dark pee (urine). Pee may be the color of tea.  Your body making less pee.  Your eyes making fewer tears.  Heartbeat that is uneven or faster than normal (palpitations).  Headache.  Light-headedness, especially when you stand up from sitting.  Fainting (syncope). Symptoms of very bad dehydration may include:  Changes in skin, such as: ? Cold and clammy skin. ? Blotchy (mottled) or pale skin. ? Skin that does not quickly return to normal after being lightly pinched and let go (poor skin turgor).  Changes in body fluids, such as: ? Feeling very thirsty. ? Your eyes making fewer tears. ? Not sweating when body temperature is high, such as in hot weather. ? Your body making very little pee.  Changes in vital signs, such as: ? Weak pulse. ? Pulse that is more than 100 beats a minute when you are sitting still. ? Fast breathing. ? Low blood pressure.  Other changes, such as: ? Sunken eyes. ? Cold hands and feet. ? Confusion. ? Lack of energy (lethargy). ? Trouble waking up from sleep. ? Short-term weight loss. ? Unconsciousness. Follow these instructions at home:  If told by your doctor, drink an ORS: ? Make an ORS by using instructions on the package. ? Start by drinking small amounts, about  cup (120 mL) every 5-10 minutes. ? Slowly drink more until you have had the amount that your doctor said to have.  Drink enough clear fluid to keep your pee clear or pale yellow. If you were told to drink an ORS, finish the ORS  first, then start slowly drinking clear fluids. Drink fluids such as: ? Water. Do not drink only water by itself. Doing that can make the salt (sodium) level in your body get too low (hyponatremia). ? Ice chips. ? Fruit juice that you have added water to (diluted). ? Low-calorie sports drinks.  Avoid: ? Alcohol. ? Drinks that have a lot of sugar. These include high-calorie sports drinks, fruit juice that does not have water added, and soda. ? Caffeine. ? Foods that are greasy or have a lot of fat or sugar.  Take over-the-counter and prescription medicines only as told by your doctor.  Do not take salt tablets. Doing that can make the salt level in your body get too high (hypernatremia).  Eat foods that have minerals (electrolytes). Examples include bananas, oranges, potatoes, tomatoes, and spinach.  Keep all follow-up visits as told by your doctor. This is important. Contact a doctor if:  You have belly (abdominal) pain that: ? Gets worse. ? Stays in one area (localizes).  You have a rash.  You have a stiff neck.  You get angry or annoyed more easily than normal (irritability).  You are more sleepy than normal.  You have a harder time waking up than normal.  You feel: ? Weak. ? Dizzy. ? Very thirsty.  You have peed (urinated) only a small amount of very dark pee during 6-8 hours. Get help right away if:  You have symptoms of   very bad dehydration.  You cannot drink fluids without throwing up (vomiting).  Your symptoms get worse with treatment.  You have a fever.  You have a very bad headache.  You are throwing up or having watery poop (diarrhea) and it: ? Gets worse. ? Does not go away.  You have blood or something green (bile) in your throw-up.  You have blood in your poop (stool). This may cause poop to look black and tarry.  You have not peed in 6-8 hours.  You pass out (faint).  Your heart rate when you are sitting still is more than 100 beats a  minute.  You have trouble breathing. This information is not intended to replace advice given to you by your health care provider. Make sure you discuss any questions you have with your health care provider. Document Released: 12/09/2008 Document Revised: 09/02/2015 Document Reviewed: 04/08/2015 Elsevier Interactive Patient Education  2018 [employer]. Viral Gastroenteritis, Adult Viral gastroenteritis is also known as the stomach flu. This condition is caused by various viruses. These viruses can be passed from person to person very easily (are very contagious). This condition may affect your stomach, small intestine, and large intestine. It can cause sudden watery diarrhea, fever, and vomiting. Diarrhea and vomiting can make you feel weak and cause you to become dehydrated. You may not be able to keep fluids down. Dehydration can make you tired and thirsty, cause you to have a dry mouth, and decrease how often you urinate. Older adults and people with other diseases or a weak immune system are at higher risk for dehydration. It is important to replace the fluids that you lose from diarrhea and vomiting. If you become severely dehydrated, you may need to get fluids through an IV tube. What are the causes? Gastroenteritis is caused by various viruses, including rotavirus and norovirus. Norovirus is the most common cause in adults. You can get sick by eating food, drinking water, or touching a surface contaminated with one of these viruses. You can also get sick from sharing utensils or other personal items with an infected person. What increases the risk? This condition is more likely to develop in people:  Who have a weak defense system (immune system).  Who live with one or more children who are younger than 83 years old.  Who live in a nursing home.  Who go on cruise ships.  What are the signs or symptoms? Symptoms of this condition start suddenly 1-2 days after exposure to a virus.  Symptoms may last a few days or as long as a week. The most common symptoms are watery diarrhea and vomiting. Other symptoms include:  Fever.  Headache.  Fatigue.  Pain in the abdomen.  Chills.  Weakness.  Nausea.  Muscle aches.  Loss of appetite.  How is this diagnosed? This condition is diagnosed with a medical history and physical exam. You may also have a stool test to check for viruses or other infections. How is this treated? This condition typically goes away on its own. The focus of treatment is to restore lost fluids (rehydration). Your health care provider may recommend that you take an oral rehydration solution (ORS) to replace important salts and minerals (electrolytes) in your body. Severe cases of this condition may require giving fluids through an IV tube. Treatment may also include medicine to help with your symptoms. Follow these instructions at home: Follow instructions from your health care provider about how to care for yourself at home. Eating and  drinking Follow these recommendations as told by your health care provider:  Take an ORS. This is a drink that is sold at pharmacies and retail stores.  Drink clear fluids in small amounts as you are able. Clear fluids include water, ice chips, diluted fruit juice, and low-calorie sports drinks.  Eat bland, easy-to-digest foods in small amounts as you are able. These foods include bananas, applesauce, rice, lean meats, toast, and crackers.  Avoid fluids that contain a lot of sugar or caffeine, such as energy drinks, sports drinks, and soda.  Avoid alcohol.  Avoid spicy or fatty foods.  General instructions   Drink enough fluid to keep your urine clear or pale yellow.  Wash your hands often. If soap and water are not available, use hand sanitizer.  Make sure that all people in your household wash their hands well and often.  Take over-the-counter and prescription medicines only as told by your health  care provider.  Rest at home while you recover.  Watch your condition for any changes.  Take a warm bath to relieve any burning or pain from frequent diarrhea episodes.  Keep all follow-up visits as told by your health care provider. This is important. Contact a health care provider if:  You cannot keep fluids down.  Your symptoms get worse.  You have new symptoms.  You feel light-headed or dizzy.  You have muscle cramps. Get help right away if:  You have chest pain.  You feel extremely weak or you faint.  You see blood in your vomit.  Your vomit looks like coffee grounds.  You have bloody or black stools or stools that look like tar.  You have a severe headache, a stiff neck, or both.  You have a rash.  You have severe pain, cramping, or bloating in your abdomen.  You have trouble breathing or you are breathing very quickly.  Your heart is beating very quickly.  Your skin feels cold and clammy.  You feel confused.  You have pain when you urinate.  You have signs of dehydration, such as: ? Dark urine, very little urine, or no urine. ? Cracked lips. ? Dry mouth. ? Sunken eyes. ? Sleepiness. ? Weakness. This information is not intended to replace advice given to you by your health care provider. Make sure you discuss any questions you have with your health care provider. Document Released: 02/12/2005 Document Revised: 07/27/2015 Document Reviewed: 10/19/2014 Elsevier Interactive Patient Education  2018 [employer]. Acute Bronchitis, Adult Acute bronchitis is sudden (acute) swelling of the air tubes (bronchi) in the lungs. Acute bronchitis causes these tubes to fill with mucus, which can make it hard to breathe. It can also cause coughing or wheezing. In adults, acute bronchitis usually goes away within 2 weeks. A cough caused by bronchitis may last up to 3 weeks. Smoking, allergies, and asthma can make the condition worse. Repeated episodes of bronchitis  may cause further lung problems, such as chronic obstructive pulmonary disease (COPD). What are the causes? This condition can be caused by germs and by substances that irritate the lungs, including:  Cold and flu viruses. This condition is most often caused by the same virus that causes a cold.  Bacteria.  Exposure to tobacco smoke, dust, fumes, and air pollution.  What increases the risk? This condition is more likely to develop in people who:  Have close contact with someone with acute bronchitis.  Are exposed to lung irritants, such as tobacco smoke, dust, fumes, and vapors.  Have a weak immune system.  Have a respiratory condition such as asthma.  What are the signs or symptoms? Symptoms of this condition include:  A cough.  Coughing up clear, yellow, or green mucus.  Wheezing.  Chest congestion.  Shortness of breath.  A fever.  Body aches.  Chills.  A sore throat.  How is this diagnosed? This condition is usually diagnosed with a physical exam. During the exam, your health care provider may order tests, such as chest X-rays, to rule out other conditions. He or she may also:  Test a sample of your mucus for bacterial infection.  Check the level of oxygen in your blood. This is done to check for pneumonia.  Do a chest X-ray or lung function testing to rule out pneumonia and other conditions.  Perform blood tests.  Your health care provider will also ask about your symptoms and medical history. How is this treated? Most cases of acute bronchitis clear up over time without treatment. Your health care provider may recommend:  Drinking more fluids. Drinking more makes your mucus thinner, which may make it easier to breathe.  Taking a medicine for a fever or cough.  Taking an antibiotic medicine.  Using an inhaler to help improve shortness of breath and to control a cough.  Using a cool mist vaporizer or humidifier to make it easier to  breathe.  Follow these instructions at home: Medicines  Take over-the-counter and prescription medicines only as told by your health care provider.  If you were prescribed an antibiotic, take it as told by your health care provider. Do not stop taking the antibiotic even if you start to feel better. General instructions  Get plenty of rest.  Drink enough fluids to keep your urine clear or pale yellow.  Avoid smoking and secondhand smoke. Exposure to cigarette smoke or irritating chemicals will make bronchitis worse. If you smoke and you need help quitting, ask your health care provider. Quitting smoking will help your lungs heal faster.  Use an inhaler, cool mist vaporizer, or humidifier as told by your health care provider.  Keep all follow-up visits as told by your health care provider. This is important. How is this prevented? To lower your risk of getting this condition again:  Wash your hands often with soap and water. If soap and water are not available, use hand sanitizer.  Avoid contact with people who have cold symptoms.  Try not to touch your hands to your mouth, nose, or eyes.  Make sure to get the flu shot every year.  Contact a health care provider if:  Your symptoms do not improve in 2 weeks of treatment. Get help right away if:  You cough up blood.  You have chest pain.  You have severe shortness of breath.  You become dehydrated.  You faint or keep feeling like you are going to faint.  You keep vomiting.  You have a severe headache.  Your fever or chills gets worse. This information is not intended to replace advice given to you by your health care provider. Make sure you discuss any questions you have with your health care provider. Document Released: 03/22/2004 Document Revised: 09/07/2015 Document Reviewed: 08/03/2015 Elsevier Interactive Patient Education  2018 [employer]. How to Use a Metered Dose Inhaler A metered dose inhaler is a  handheld device for taking medicine that must be breathed into the lungs (inhaled). The device can be used to deliver a variety of inhaled medicines, including:  Quick  relief or rescue medicines, such as bronchodilators.  Controller medicines, such as corticosteroids.  The medicine is delivered by pushing down on a metal canister to release a preset amount of spray and medicine. Each device contains the amount of medicine that is needed for a preset number of uses (inhalations). Your health care provider may recommend that you use a spacer with your inhaler to help you take the medicine more effectively. A spacer is a plastic tube with a mouthpiece on one end and an opening that connects to the inhaler on the other end. A spacer holds the medicine in a tube for a short time, which allows you to inhale more medicine. What are the risks? If you do not use your inhaler correctly, medicine might not reach your lungs to help you breathe. Inhaler medicine can cause side effects, such as:  Mouth or throat infection.  Cough.  Hoarseness.  Headache.  Nausea and vomiting.  Lung infection (pneumonia) in people who have a lung condition called COPD.  How to use a metered dose inhaler without a spacer 1. Remove the cap from the inhaler. 2. If you are using the inhaler for the first time, shake it for 5 seconds, turn it away from your face, then release 4 puffs into the air. This is called priming. 3. Shake the inhaler for 5 seconds. 4. Position the inhaler so the top of the canister faces up. 5. Put your index finger on the top of the medicine canister. Support the bottom of the inhaler with your thumb. 6. Breathe out normally and as completely as possible, away from the inhaler. 7. Either place the inhaler between your teeth and close your lips tightly around the mouthpiece, or hold the inhaler 1-2 inches (2.5-5 cm) away from your open mouth. Keep your tongue down out of the way. If you are unsure  which technique to use, ask your health care provider. 8. Press the canister down with your index finger to release the medicine, then inhale deeply and slowly through your mouth (not your nose) until your lungs are completely filled. Inhaling should take 4-6 seconds. 9. Hold the medicine in your lungs for 5-10 seconds (10 seconds is best). This helps the medicine get into the small airways of your lungs. 10. With your lips in a tight circle (pursed), breathe out slowly. 11. Repeat steps 3-10 until you have taken the number of puffs that your health care provider directed. Wait about 1 minute between puffs or as directed. 12. Put the cap on the inhaler. 13. If you are using a steroid inhaler, rinse your mouth with water, gargle, and spit out the water. Do not swallow the water. How to use a metered dose inhaler with a spacer 1. Remove the cap from the inhaler. 2. If you are using the inhaler for the first time, shake it for 5 seconds, turn it away from your face, then release 4 puffs into the air. This is called priming. 3. Shake the inhaler for 5 seconds. 4. Place the open end of the spacer onto the inhaler mouthpiece. 5. Position the inhaler so the top of the canister faces up and the spacer mouthpiece faces you. 6. Put your index finger on the top of the medicine canister. Support the bottom of the inhaler and the spacer with your thumb. 7. Breathe out normally and as completely as possible, away from the spacer. 8. Place the spacer between your teeth and close your lips tightly around it. Keep your  tongue down out of the way. 9. Press the canister down with your index finger to release the medicine, then inhale deeply and slowly through your mouth (not your nose) until your lungs are completely filled. Inhaling should take 4-6 seconds. 10. Hold the medicine in your lungs for 5-10 seconds (10 seconds is best). This helps the medicine get into the small airways of your lungs. 11. With your lips  in a tight circle (pursed), breathe out slowly. 12. Repeat steps 3-11 until you have taken the number of puffs that your health care provider directed. Wait about 1 minute between puffs or as directed. 13. Remove the spacer from the inhaler and put the cap on the inhaler. 14. If you are using a steroid inhaler, rinse your mouth with water, gargle, and spit out the water. Do not swallow the water. Follow these instructions at home:  Take your inhaled medicine only as told by your health care provider. Do not use the inhaler more than directed by your health care provider.  Keep all follow-up visits as told by your health care provider. This is important.  If your inhaler has a counter, you can check it to determine how full your inhaler is. If your inhaler does not have a counter, ask your health care provider when you will need to refill your inhaler and write the refill date on a calendar or on your inhaler canister. Note that you cannot know when an inhaler is empty by shaking it.  Follow directions on the package insert for care and cleaning of your inhaler and spacer. Contact a health care provider if:  Symptoms are only partially relieved with your inhaler.  You are having trouble using your inhaler.  You have an increase in phlegm.  You have headaches. Get help right away if:  You feel little or no relief after using your inhaler.  You have dizziness.  You have a fast heart rate.  You have chills or a fever.  You have night sweats.  There is blood in your phlegm. Summary  A metered dose inhaler is a handheld device for taking medicine that must be breathed into the lungs (inhaled).  The medicine is delivered by pushing down on a metal canister to release a preset amount of spray and medicine.  Each device contains the amount of medicine that is needed for a preset number of uses (inhalations). This information is not intended to replace advice given to you by your  health care provider. Make sure you discuss any questions you have with your health care provider. Document Released: 02/12/2005 Document Revised: 01/03/2016 Document Reviewed: 01/03/2016 Elsevier Interactive Patient Education  2017 [employer] Sinusitis, Adult Sinusitis is soreness and inflammation of your sinuses. Sinuses are hollow spaces in the bones around your face. Your sinuses are located:  Around your eyes.  In the middle of your forehead.  Behind your nose.  In your cheekbones.  Your sinuses and nasal passages are lined with a stringy fluid (mucus). Mucus normally drains out of your sinuses. When your nasal tissues become inflamed or swollen, the mucus can become trapped or blocked so air cannot flow through your sinuses. This allows bacteria, viruses, and funguses to grow, which leads to infection. Sinusitis can develop quickly and last for 7?10 days (acute) or for more than 12 weeks (chronic). Sinusitis often develops after a cold. What are the causes? This condition is caused by anything that creates swelling in the sinuses or stops mucus from draining,  including:  Allergies.  Asthma.  Bacterial or viral infection.  Abnormally shaped bones between the nasal passages.  Nasal growths that contain mucus (nasal polyps).  Narrow sinus openings.  Pollutants, such as chemicals or irritants in the air.  A foreign object stuck in the nose.  A fungal infection. This is rare.  What increases the risk? The following factors may make you more likely to develop this condition:  Having allergies or asthma.  Having had a recent cold or respiratory tract infection.  Having structural deformities or blockages in your nose or sinuses.  Having a weak immune system.  Doing a lot of swimming or diving.  Overusing nasal sprays.  Smoking.  What are the signs or symptoms? The main symptoms of this condition are pain and a feeling of pressure around the affected sinuses.  Other symptoms include:  Upper toothache.  Earache.  Headache.  Bad breath.  Decreased sense of smell and taste.  A cough that may get worse at night.  Fatigue.  Fever.  Thick drainage from your nose. The drainage is often green and it may contain pus (purulent).  Stuffy nose or congestion.  Postnasal drip. This is when extra mucus collects in the throat or back of the nose.  Swelling and warmth over the affected sinuses.  Sore throat.  Sensitivity to light.  How is this diagnosed? This condition is diagnosed based on symptoms, a medical history, and a physical exam. To find out if your condition is acute or chronic, your health care provider may:  Look in your nose for signs of nasal polyps.  Tap over the affected sinus to check for signs of infection.  View the inside of your sinuses using an imaging device that has a light attached (endoscope).  If your health care provider suspects that you have chronic sinusitis, you may also:  Be tested for allergies.  Have a sample of mucus taken from your nose (nasal culture) and checked for bacteria.  Have a mucus sample examined to see if your sinusitis is related to an allergy.  If your sinusitis does not respond to treatment and it lasts longer than 8 weeks, you may have an MRI or CT scan to check your sinuses. These scans also help to determine how severe your infection is. In rare cases, a bone biopsy may be done to rule out more serious types of fungal sinus disease. How is this treated? Treatment for sinusitis depends on the cause and whether your condition is chronic or acute. If a virus is causing your sinusitis, your symptoms will go away on their own within 10 days. You may be given medicines to relieve your symptoms, including:  Topical nasal decongestants. They shrink swollen nasal passages and let mucus drain from your sinuses.  Antihistamines. These drugs block inflammation that is triggered by allergies.  This can help to ease swelling in your nose and sinuses.  Topical nasal corticosteroids. These are nasal sprays that ease inflammation and swelling in your nose and sinuses.  Nasal saline washes. These rinses can help to get rid of thick mucus in your nose.  If your condition is caused by bacteria, you will be given an antibiotic medicine. If your condition is caused by a fungus, you will be given an antifungal medicine. Surgery may be needed to correct underlying conditions, such as narrow nasal passages. Surgery may also be needed to remove polyps. Follow these instructions at home: Medicines  Take, use, or apply over-the-counter and prescription medicines  only as told by your health care provider. These may include nasal sprays.  If you were prescribed an antibiotic medicine, take it as told by your health care provider. Do not stop taking the antibiotic even if you start to feel better. Hydrate and Humidify  Drink enough water to keep your urine clear or pale yellow. Staying hydrated will help to thin your mucus.  Use a cool mist humidifier to keep the humidity level in your home above 50%.  Inhale steam for 10-15 minutes, 3-4 times a day or as told by your health care provider. You can do this in the bathroom while a hot shower is running.  Limit your exposure to cool or dry air. Rest  Rest as much as possible.  Sleep with your head raised (elevated).  Make sure to get enough sleep each night. General instructions  Apply a warm, moist washcloth to your face 3-4 times a day or as told by your health care provider. This will help with discomfort.  Wash your hands often with soap and water to reduce your exposure to viruses and other germs. If soap and water are not available, use hand sanitizer.  Do not smoke. Avoid being around people who are smoking (secondhand smoke).  Keep all follow-up visits as told by your health care provider. This is important. Contact a health care  provider if:  You have a fever.  Your symptoms get worse.  Your symptoms do not improve within 10 days. Get help right away if:  You have a severe headache.  You have persistent vomiting.  You have pain or swelling around your face or eyes.  You have vision problems.  You develop confusion.  Your neck is stiff.  You have trouble breathing. This information is not intended to replace advice given to you by your health care provider. Make sure you discuss any questions you have with your health care provider. Document Released: 02/12/2005 Document Revised: 10/09/2015 Document Reviewed: 12/08/2014 Elsevier Interactive Patient Education  2018 [place]. Sinus Rinse What is a sinus rinse? A sinus rinse is a simple home treatment that is used to rinse your sinuses with a sterile mixture of salt and water (saline solution). Sinuses are air-filled spaces in your skull behind the bones of your face and forehead that open into your nasal cavity. You will use the following:  Saline solution.  Neti pot or spray bottle. This releases the saline solution into your nose and through your sinuses. Neti pots and spray bottles can be purchased at [employer], a health food store, or online.  When would I do a sinus rinse? A sinus rinse can help to clear mucus, dirt, dust, or pollen from the nasal cavity. You may do a sinus rinse when you have a cold, a virus, nasal allergy symptoms, a sinus infection, or stuffiness in the nose or sinuses. If you are considering a sinus rinse:  Ask your child's health care provider before performing a sinus rinse on your child.  Do not do a sinus rinse if you have had ear or nasal surgery, ear infection, or blocked ears.  How do I do a sinus rinse?  Wash your hands.  Disinfect your device according to the directions provided and then dry it.  Use the solution that comes with your device or one that is sold separately in stores. Follow the  mixing directions on the package.  Fill your device with the amount of saline solution as directed by the  device instructions.  Stand over a sink and tilt your head sideways over the sink.  Place the spout of the device in your upper nostril (the one closer to the ceiling).  Gently pour or squeeze the saline solution into the nasal cavity. The liquid should drain to the lower nostril if you are not overly congested.  Gently blow your nose. Blowing too hard may cause ear pain.  Repeat in the other nostril.  Clean and rinse your device with clean water and then air-dry it. Are there risks of a sinus rinse? Sinus rinse is generally very safe and effective. However, there are a few risks, which include:  A burning sensation in the sinuses. This may happen if you do not make the saline solution as directed. Make sure to follow all directions when making the saline solution.  Infection from contaminated water. This is rare, but possible.  Nasal irritation.  This information is not intended to replace advice given to you by your health care provider. Make sure you discuss any questions you have with your health care provider. Document Released: 09/09/2013 Document Revised: 01/10/2016 Document Reviewed: 06/30/2013 Elsevier Interactive Patient Education  2017 [employer].

## 2017-08-27 ENCOUNTER — Encounter: Payer: Self-pay | Admitting: Registered Nurse

## 2017-08-27 ENCOUNTER — Ambulatory Visit: Payer: Self-pay | Admitting: Registered Nurse

## 2017-08-27 VITALS — BP 134/85 | HR 64 | Temp 97.7°F

## 2017-08-27 DIAGNOSIS — J0111 Acute recurrent frontal sinusitis: Secondary | ICD-10-CM

## 2017-08-27 MED ORDER — PREDNISONE 10 MG PO TABS: 30.0000 mg | ORAL_TABLET | Freq: Every day | ORAL | 0 refills | Status: AC

## 2017-08-27 MED ORDER — SALINE SPRAY 0.65 % NA SOLN: 2.0000 | NASAL | 0 refills | Status: DC

## 2017-08-27 NOTE — Progress Notes (Signed)
Subjective:    Patient ID: Jamie Kelley, female    DOB: [birth year], 62 y.o.   MRN: [id number]  62y/o African-American established female pt c/o maxillary and frontal sinus pain/pressure, nasal congestion, sore throat, mildly productive cough, generalized HA. Denies otalgia, itchy/watery eyes, rhinorrhea, chest congestion. Sx x1wk. Using Zyrtec and Allegra alternating, Albuterol inh, Flonase, and Ibuprofen.Doxycycline 100mg  po bid from urgent care completed beginning of June wasn't getting better saw me and started prednisone taper was feeling better and then backslid again when prednisone ended.       Review of Systems  Constitutional: Positive for fatigue. Negative for activity change, appetite change, chills, diaphoresis, fever and unexpected weight change.  HENT: Positive for congestion, sinus pressure, sinus pain and sore throat. Negative for dental problem, drooling, ear discharge, ear pain, facial swelling, hearing loss, mouth sores, nosebleeds, postnasal drip, rhinorrhea, sneezing, tinnitus, trouble swallowing and voice change.   Eyes: Negative for photophobia, pain, discharge, redness, itching and visual disturbance.  Respiratory: Positive for cough. Negative for choking, chest tightness, shortness of breath, wheezing and stridor.   Cardiovascular: Negative for chest pain, palpitations and leg swelling.  Gastrointestinal: Negative for abdominal distention, abdominal pain, blood in stool, constipation, diarrhea, nausea and vomiting.  Endocrine: Negative for cold intolerance and heat intolerance.  Genitourinary: Negative for difficulty urinating, dysuria and hematuria.  Musculoskeletal: Negative for arthralgias, back pain, gait problem, joint swelling, myalgias, neck pain and neck stiffness.  Skin: Negative for color change, pallor, rash and wound.  Allergic/Immunologic: Positive for environmental allergies. Negative for food allergies.  Neurological: Positive for headaches. Negative for  dizziness, tremors, seizures, syncope, facial asymmetry, speech difficulty, weakness, light-headedness and numbness.  Hematological: Negative for adenopathy. Does not bruise/bleed easily.  Psychiatric/Behavioral: Negative for agitation, confusion and sleep disturbance.       Objective:   Physical Exam  Constitutional: She is oriented to person, place, and time. Vital signs are normal. She appears well-developed and well-nourished. She is active and cooperative.  Non-toxic appearance. She does not have a sickly appearance. She appears ill. No distress.  HENT:  Head: Normocephalic and atraumatic.  Right Ear: Hearing, external ear and ear canal normal. A middle ear effusion is present.  Left Ear: Hearing, external ear and ear canal normal. A middle ear effusion is present.  Nose: Mucosal edema and rhinorrhea present. No nose lacerations, sinus tenderness, nasal deformity, septal deviation or nasal septal hematoma. No epistaxis.  No foreign bodies. Right sinus exhibits maxillary sinus tenderness and frontal sinus tenderness. Left sinus exhibits maxillary sinus tenderness and frontal sinus tenderness.  Mouth/Throat: Uvula is midline and mucous membranes are normal. Mucous membranes are not pale, not dry and not cyanotic. She does not have dentures. No oral lesions. No trismus in the jaw. Normal dentition. No dental abscesses, uvula swelling, lacerations or dental caries. Posterior oropharyngeal edema and posterior oropharyngeal erythema present. No oropharyngeal exudate or tonsillar abscesses. No tonsillar exudate.  Cobblestoning posterior pharynx; bilateral allergic shiners; lower eyelids nonpitting edema 1-2+/4 bilaterally; air fluid level 50% opacity bilateral TMs slight erythema 6-8 oclock position; bilateral nasal turbinates clear discharge scant edema/erythema; frontal worse than maxillary sinus pain bilaterally with palpation  Eyes: Pupils are equal, round, and reactive to light. Conjunctivae, EOM  and lids are normal. Right eye exhibits no chemosis, no discharge, no exudate and no hordeolum. No foreign body present in the right eye. Left eye exhibits no chemosis, no discharge, no exudate and no hordeolum. No foreign body present in the left eye. Right conjunctiva  is not injected. Right conjunctiva has no hemorrhage. Left conjunctiva is not injected. Left conjunctiva has no hemorrhage. No scleral icterus. Right eye exhibits normal extraocular motion and no nystagmus. Left eye exhibits normal extraocular motion and no nystagmus. Right pupil is round and reactive. Left pupil is round and reactive. Pupils are equal.  Neck: Trachea normal and normal range of motion. Neck supple. No tracheal tenderness, no spinous process tenderness and no muscular tenderness present. No neck rigidity. No tracheal deviation, no edema, no erythema and normal range of motion present. No thyroid mass and no thyromegaly present.  Cardiovascular: Normal rate, regular rhythm, S1 normal, S2 normal, normal heart sounds and intact distal pulses. PMI is not displaced. Exam reveals no gallop and no friction rub.  No murmur heard. Pulmonary/Chest: Effort normal and breath sounds normal. No accessory muscle usage or stridor. No respiratory distress. She has no decreased breath sounds. She has no wheezes. She has no rhonchi. She has no rales. She exhibits no tenderness.  Spoke full sentences without difficulty; no cough observed in exam room  Abdominal: Soft. She exhibits no distension.  Musculoskeletal: Normal range of motion. She exhibits no edema or tenderness.       Right shoulder: Normal.       Left shoulder: Normal.       Right elbow: Normal.      Left elbow: Normal.       Right hip: Normal.       Left hip: Normal.       Right knee: Normal.       Left knee: Normal.       Cervical back: Normal.       Thoracic back: Normal.       Lumbar back: Normal.       Right hand: Normal.       Left hand: Normal.  Lymphadenopathy:        Head (right side): No submental, no submandibular, no tonsillar, no preauricular, no posterior auricular and no occipital adenopathy present.       Head (left side): No submental, no submandibular, no tonsillar, no preauricular, no posterior auricular and no occipital adenopathy present.    She has no cervical adenopathy.       Right cervical: No superficial cervical, no deep cervical and no posterior cervical adenopathy present.      Left cervical: No superficial cervical, no deep cervical and no posterior cervical adenopathy present.  Neurological: She is alert and oriented to person, place, and time. She has normal strength. She is not disoriented. She displays no atrophy and no tremor. No cranial nerve deficit or sensory deficit. She exhibits normal muscle tone. She displays no seizure activity. Coordination and gait normal. GCS eye subscore is 4. GCS verbal subscore is 5. GCS motor subscore is 6.  On/off exam table; in/out of chair without difficulty; gait sure and steady in hallway  Skin: Skin is warm, dry and intact. Capillary refill takes less than 2 seconds. No abrasion, no bruising, no burn, no ecchymosis, no laceration, no lesion, no petechiae and no rash noted. She is not diaphoretic. No cyanosis or erythema. No pallor. Nails show no clubbing.  Psychiatric: She has a normal mood and affect. Her speech is normal and behavior is normal. Judgment and thought content normal. She is not actively hallucinating. Cognition and memory are normal. She is attentive.  Nursing note and vitals reviewed.         Assessment & Plan:  A-recurrent frontal sinusitis  P-continue flonase 1 spray each nostril BID, saline 2 sprays each nostril q2h wa prn congestion; prednisone 10mg  sig take 3 po daily with breakfast x 7 days #21 RF0 dispensed from [facility] follow up if no improvement will consider taper burst (60/50/40/30/20/10 and repeat doxycycline course  Denied personal or family history of ENT cancer.   Shower BID especially prior to bed. No evidence of systemic bacterial infection, non toxic and well hydrated.  I do not see where any further testing or imaging is necessary at this time.   I will suggest supportive care, rest, good hygiene and encourage the patient to take adequate fluids.  The patient is to return to clinic or EMERGENCY ROOM if symptoms worsen or change significantly.  Exitcare handout on sinusitis and sinus rinse.  Discussed clinic will be closed until 8 Jul after today due to holiday and entire worksite closure 4 Jul  Patient verbalized agreement and understanding of treatment plan and had no further questions at this time.   P2:  Hand washing and cover cough

## 2017-08-27 NOTE — Patient Instructions (Signed)
Sinusitis, Adult  Sinusitis is soreness and inflammation of your sinuses. Sinuses are hollow spaces in the bones around your face. Your sinuses are located:  · Around your eyes.  · In the middle of your forehead.  · Behind your nose.  · In your cheekbones.    Your sinuses and nasal passages are lined with a stringy fluid (mucus). Mucus normally drains out of your sinuses. When your nasal tissues become inflamed or swollen, the mucus can become trapped or blocked so air cannot flow through your sinuses. This allows bacteria, viruses, and funguses to grow, which leads to infection.  Sinusitis can develop quickly and last for 7?10 days (acute) or for more than 12 weeks (chronic). Sinusitis often develops after a cold.  What are the causes?  This condition is caused by anything that creates swelling in the sinuses or stops mucus from draining, including:  · Allergies.  · Asthma.  · Bacterial or viral infection.  · Abnormally shaped bones between the nasal passages.  · Nasal growths that contain mucus (nasal polyps).  · Narrow sinus openings.  · Pollutants, such as chemicals or irritants in the air.  · A foreign object stuck in the nose.  · A fungal infection. This is rare.    What increases the risk?  The following factors may make you more likely to develop this condition:  · Having allergies or asthma.  · Having had a recent cold or respiratory tract infection.  · Having structural deformities or blockages in your nose or sinuses.  · Having a weak immune system.  · Doing a lot of swimming or diving.  · Overusing nasal sprays.  · Smoking.    What are the signs or symptoms?  The main symptoms of this condition are pain and a feeling of pressure around the affected sinuses. Other symptoms include:  · Upper toothache.  · Earache.  · Headache.  · Bad breath.  · Decreased sense of smell and taste.  · A cough that may get worse at night.  · Fatigue.  · Fever.   · Thick drainage from your nose. The drainage is often green and it may contain pus (purulent).  · Stuffy nose or congestion.  · Postnasal drip. This is when extra mucus collects in the throat or back of the nose.  · Swelling and warmth over the affected sinuses.  · Sore throat.  · Sensitivity to light.    How is this diagnosed?  This condition is diagnosed based on symptoms, a medical history, and a physical exam. To find out if your condition is acute or chronic, your health care provider may:  · Look in your nose for signs of nasal polyps.  · Tap over the affected sinus to check for signs of infection.  · View the inside of your sinuses using an imaging device that has a light attached (endoscope).    If your health care provider suspects that you have chronic sinusitis, you may also:  · Be tested for allergies.  · Have a sample of mucus taken from your nose (nasal culture) and checked for bacteria.  · Have a mucus sample examined to see if your sinusitis is related to an allergy.    If your sinusitis does not respond to treatment and it lasts longer than 8 weeks, you may have an MRI or CT scan to check your sinuses. These scans also help to determine how severe your infection is.  In rare cases, a bone   biopsy may be done to rule out more serious types of fungal sinus disease.  How is this treated?  Treatment for sinusitis depends on the cause and whether your condition is chronic or acute. If a virus is causing your sinusitis, your symptoms will go away on their own within 10 days. You may be given medicines to relieve your symptoms, including:  · Topical nasal decongestants. They shrink swollen nasal passages and let mucus drain from your sinuses.  · Antihistamines. These drugs block inflammation that is triggered by allergies. This can help to ease swelling in your nose and sinuses.  · Topical nasal corticosteroids. These are nasal sprays that ease inflammation and swelling in your nose and sinuses.   · Nasal saline washes. These rinses can help to get rid of thick mucus in your nose.    If your condition is caused by bacteria, you will be given an antibiotic medicine. If your condition is caused by a fungus, you will be given an antifungal medicine.  Surgery may be needed to correct underlying conditions, such as narrow nasal passages. Surgery may also be needed to remove polyps.  Follow these instructions at home:  Medicines  · Take, use, or apply over-the-counter and prescription medicines only as told by your health care provider. These may include nasal sprays.  · If you were prescribed an antibiotic medicine, take it as told by your health care provider. Do not stop taking the antibiotic even if you start to feel better.  Hydrate and Humidify  · Drink enough water to keep your urine clear or pale yellow. Staying hydrated will help to thin your mucus.  · Use a cool mist humidifier to keep the humidity level in your home above 50%.  · Inhale steam for 10–15 minutes, 3–4 times a day or as told by your health care provider. You can do this in the bathroom while a hot shower is running.  · Limit your exposure to cool or dry air.  Rest  · Rest as much as possible.  · Sleep with your head raised (elevated).  · Make sure to get enough sleep each night.  General instructions  · Apply a warm, moist washcloth to your face 3–4 times a day or as told by your health care provider. This will help with discomfort.  · Wash your hands often with soap and water to reduce your exposure to viruses and other germs. If soap and water are not available, use hand sanitizer.  · Do not smoke. Avoid being around people who are smoking (secondhand smoke).  · Keep all follow-up visits as told by your health care provider. This is important.  Contact a health care provider if:  · You have a fever.  · Your symptoms get worse.  · Your symptoms do not improve within 10 days.  Get help right away if:  · You have a severe headache.   · You have persistent vomiting.  · You have pain or swelling around your face or eyes.  · You have vision problems.  · You develop confusion.  · Your neck is stiff.  · You have trouble breathing.  This information is not intended to replace advice given to you by your health care provider. Make sure you discuss any questions you have with your health care provider.  Document Released: 02/12/2005 Document Revised: 10/09/2015 Document Reviewed: 12/08/2014  Elsevier Interactive Patient Education © 2018 [employer]      Sinus Rinse  What is   a sinus rinse?  A sinus rinse is a simple home treatment that is used to rinse your sinuses with a sterile mixture of salt and water (saline solution). Sinuses are air-filled spaces in your skull behind the bones of your face and forehead that open into your nasal cavity.  You will use the following:  · Saline solution.  · Neti pot or spray bottle. This releases the saline solution into your nose and through your sinuses. Neti pots and spray bottles can be purchased at your local pharmacy, a health food store, or online.    When would I do a sinus rinse?  A sinus rinse can help to clear mucus, dirt, dust, or pollen from the nasal cavity. You may do a sinus rinse when you have a cold, a virus, nasal allergy symptoms, a sinus infection, or stuffiness in the nose or sinuses.  If you are considering a sinus rinse:  · Ask your child's health care provider before performing a sinus rinse on your child.  · Do not do a sinus rinse if you have had ear or nasal surgery, ear infection, or blocked ears.    How do I do a sinus rinse?  · Wash your hands.  · Disinfect your device according to the directions provided and then dry it.  · Use the solution that comes with your device or one that is sold separately in stores. Follow the mixing directions on the package.  · Fill your device with the amount of saline solution as directed by the device instructions.   · Stand over a sink and tilt your head sideways over the sink.  · Place the spout of the device in your upper nostril (the one closer to the ceiling).  · Gently pour or squeeze the saline solution into the nasal cavity. The liquid should drain to the lower nostril if you are not overly congested.  · Gently blow your nose. Blowing too hard may cause ear pain.  · Repeat in the other nostril.  · Clean and rinse your device with clean water and then air-dry it.  Are there risks of a sinus rinse?  Sinus rinse is generally very safe and effective. However, there are a few risks, which include:  · A burning sensation in the sinuses. This may happen if you do not make the saline solution as directed. Make sure to follow all directions when making the saline solution.  · Infection from contaminated water. This is rare, but possible.  · Nasal irritation.    This information is not intended to replace advice given to you by your health care provider. Make sure you discuss any questions you have with your health care provider.  Document Released: 09/09/2013 Document Revised: 01/10/2016 Document Reviewed: 06/30/2013  Elsevier Interactive Patient Education © 2017 [employer]

## 2017-11-08 LAB — HM MAMMOGRAPHY

## 2017-11-25 ENCOUNTER — Other Ambulatory Visit: Payer: Self-pay | Admitting: Family Medicine

## 2017-12-25 ENCOUNTER — Encounter: Payer: Self-pay | Admitting: Family Medicine

## 2017-12-25 ENCOUNTER — Other Ambulatory Visit: Payer: Self-pay

## 2017-12-25 ENCOUNTER — Ambulatory Visit: Payer: PRIVATE HEALTH INSURANCE | Admitting: Family Medicine

## 2017-12-25 VITALS — BP 128/82 | HR 68 | Temp 98.5°F | Resp 16 | Ht 61.0 in | Wt 199.2 lb

## 2017-12-25 DIAGNOSIS — E038 Other specified hypothyroidism: Secondary | ICD-10-CM

## 2017-12-25 DIAGNOSIS — F320 Major depressive disorder, single episode, mild: Secondary | ICD-10-CM

## 2017-12-25 DIAGNOSIS — I10 Essential (primary) hypertension: Secondary | ICD-10-CM

## 2017-12-25 DIAGNOSIS — E785 Hyperlipidemia, unspecified: Secondary | ICD-10-CM | POA: Diagnosis not present

## 2017-12-25 DIAGNOSIS — Z23 Encounter for immunization: Secondary | ICD-10-CM | POA: Diagnosis not present

## 2017-12-25 DIAGNOSIS — F329 Major depressive disorder, single episode, unspecified: Secondary | ICD-10-CM | POA: Insufficient documentation

## 2017-12-25 DIAGNOSIS — F32A Depression, unspecified: Secondary | ICD-10-CM | POA: Insufficient documentation

## 2017-12-25 MED ORDER — SERTRALINE HCL 25 MG PO TABS: 25.0000 mg | ORAL_TABLET | Freq: Every day | ORAL | 3 refills | Status: DC

## 2017-12-25 MED ORDER — HYDROCHLOROTHIAZIDE 25 MG PO TABS: 25.0000 mg | ORAL_TABLET | Freq: Every day | ORAL | 1 refills | Status: DC

## 2017-12-25 MED ORDER — AMLODIPINE BESYLATE 10 MG PO TABS: 10.0000 mg | ORAL_TABLET | Freq: Every day | ORAL | 1 refills | Status: DC

## 2017-12-25 MED ORDER — LEVOTHYROXINE SODIUM 50 MCG PO TABS: 25.0000 ug | ORAL_TABLET | Freq: Every day | ORAL | 1 refills | Status: DC

## 2017-12-25 MED ORDER — SIMVASTATIN 20 MG PO TABS: 20.0000 mg | ORAL_TABLET | Freq: Every day | ORAL | 1 refills | Status: DC

## 2017-12-25 MED ORDER — ATENOLOL 50 MG PO TABS: 100.0000 mg | ORAL_TABLET | Freq: Every day | ORAL | 1 refills | Status: DC

## 2017-12-25 NOTE — Assessment & Plan Note (Signed)
Chronic problem.  Tolerating statin w/o difficulty.  Check labs.  Adjust meds prn  

## 2017-12-25 NOTE — Assessment & Plan Note (Signed)
Chronic problem.  + fatigue but I suspect this is due to her depression.  Check labs.  Adjust meds prn

## 2017-12-25 NOTE — Progress Notes (Signed)
   Subjective:    Patient ID: Jamie Kelley, female    DOB: 06/26/1955, 62 y.o.   MRN: [id number]  HPI HTN- chronic problem, on Amlodipine 10mg  daily, Atenolol 100mg  daily, HCTZ 25mg  daily.  No CP, SOB, HAs, visual changes, edema  Hyperlipidemia- chronic problem, on Simvastatin 20mg  daily.  Pt has gained 15 lbs.  No abd pain.  + nausea  Hypothyroid- chronic problem, on 78mcg daily.  + fatigue.  No changes to skin/hair/nails.  Depression- pt scored a 10 on her PHQ9 today.  'there's a lot going on'.  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  Pt reports decreased energy level, decreased motivation, lack of enjoyment.  'it's overwhelming'.     Review of Systems For ROS see HPI     Objective:   Physical Exam  Constitutional: She is oriented to person, place, and time. She appears well-developed and well-nourished. No distress.  HENT:  Head: Normocephalic and atraumatic.  Eyes: Pupils are equal, round, and reactive to light. Conjunctivae and EOM are normal.  Neck: Normal range of motion. Neck supple. No thyromegaly present.  Cardiovascular: Normal rate, regular rhythm, normal heart sounds and intact distal pulses.  No murmur heard. Pulmonary/Chest: Effort normal and breath sounds normal. No respiratory distress.  Abdominal: Soft. She exhibits no distension. There is no tenderness.  Musculoskeletal: She exhibits no edema.  Lymphadenopathy:    She has no cervical adenopathy.  Neurological: She is alert and oriented to person, place, and time.  Skin: Skin is warm and dry.  Psychiatric: Her behavior is normal.  Flat affect, clearly depressed  Vitals reviewed.         Assessment & Plan:

## 2017-12-25 NOTE — Patient Instructions (Signed)
Follow up in 3-4 weeks to recheck mood We'll notify you of your lab results and make any changes if needed START the Sertraline once daily to improve mood We'll notify you of your lab results and make any changes if needed Consider a stress outlet!  Exercise, art, music, etc- whatever helps you relax! Call with any questions or concerns Hang in there!!

## 2017-12-25 NOTE — Assessment & Plan Note (Signed)
Chronic problem.  Well controlled.  Asymptomatic.  Check labs.  No anticipated med changes. 

## 2017-12-25 NOTE — Assessment & Plan Note (Signed)
New.  Pt is clearly overwhelmed with her family issues and the role of caregiver.  Husband is here and supportive and would like her to start medication to improve her mood.  Start low dose Sertraline and monitor closely for improvement.

## 2017-12-26 ENCOUNTER — Other Ambulatory Visit: Payer: Self-pay | Admitting: Family Medicine

## 2017-12-26 ENCOUNTER — Encounter: Payer: Self-pay | Admitting: General Practice

## 2017-12-26 LAB — CBC WITH DIFFERENTIAL/PLATELET
BASOS ABS: 0.1 10*3/uL (ref 0.0–0.1)
Basophils Relative: 0.8 % (ref 0.0–3.0)
Eosinophils Absolute: 0.2 10*3/uL (ref 0.0–0.7)
Eosinophils Relative: 2.1 % (ref 0.0–5.0)
HEMATOCRIT: 41 % (ref 36.0–46.0)
HEMOGLOBIN: 14 g/dL (ref 12.0–15.0)
LYMPHS PCT: 34.8 % (ref 12.0–46.0)
Lymphs Abs: 3.1 10*3/uL (ref 0.7–4.0)
MCHC: 34.2 g/dL (ref 30.0–36.0)
MCV: 90.4 fl (ref 78.0–100.0)
MONOS PCT: 6.5 % (ref 3.0–12.0)
Monocytes Absolute: 0.6 10*3/uL (ref 0.1–1.0)
NEUTROS PCT: 55.8 % (ref 43.0–77.0)
Neutro Abs: 4.9 10*3/uL (ref 1.4–7.7)
Platelets: 216 10*3/uL (ref 150.0–400.0)
RBC: 4.54 Mil/uL (ref 3.87–5.11)
RDW: 14 % (ref 11.5–15.5)
WBC: 8.8 10*3/uL (ref 4.0–10.5)

## 2017-12-26 LAB — LIPID PANEL
CHOL/HDL RATIO: 3
CHOLESTEROL: 206 mg/dL — AB (ref 0–200)
HDL: 63 mg/dL (ref >39.00)
LDL CALC: 116 mg/dL — AB (ref 0–99)
NonHDL: 143.24
Triglycerides: 135 mg/dL (ref 0.0–149.0)
VLDL: 27 mg/dL (ref 0.0–40.0)

## 2017-12-26 LAB — HEPATIC FUNCTION PANEL
ALBUMIN: 4.7 g/dL (ref 3.5–5.2)
ALT: 17 U/L (ref 0–35)
AST: 22 U/L (ref 0–37)
Alkaline Phosphatase: 73 U/L (ref 39–117)
Bilirubin, Direct: 0.1 mg/dL (ref 0.0–0.3)
TOTAL PROTEIN: 7.8 g/dL (ref 6.0–8.3)
Total Bilirubin: 0.4 mg/dL (ref 0.2–1.2)

## 2017-12-26 LAB — BASIC METABOLIC PANEL
BUN: 17 mg/dL (ref 6–23)
CALCIUM: 9.8 mg/dL (ref 8.4–10.5)
CO2: 30 mEq/L (ref 19–32)
Chloride: 102 mEq/L (ref 96–112)
Creatinine, Ser: 0.71 mg/dL (ref 0.40–1.20)
GFR: 107.85 mL/min (ref >60.00)
Glucose, Bld: 98 mg/dL (ref 70–99)
Potassium: 4.3 mEq/L (ref 3.5–5.1)
Sodium: 138 mEq/L (ref 135–145)

## 2017-12-26 LAB — TSH: TSH: 1.42 u[IU]/mL (ref 0.35–4.50)

## 2018-03-11 ENCOUNTER — Encounter (HOSPITAL_COMMUNITY): Payer: Self-pay

## 2018-03-11 ENCOUNTER — Other Ambulatory Visit: Payer: Self-pay

## 2018-03-11 ENCOUNTER — Ambulatory Visit (HOSPITAL_COMMUNITY)
Admission: EM | Admit: 2018-03-11 | Discharge: 2018-03-11 | Disposition: A | Discharge status: Home / Self Care | Payer: PRIVATE HEALTH INSURANCE | Attending: Family Medicine | Admitting: Family Medicine

## 2018-03-11 DIAGNOSIS — J111 Influenza due to unidentified influenza virus with other respiratory manifestations: Secondary | ICD-10-CM

## 2018-03-11 DIAGNOSIS — R69 Illness, unspecified: Secondary | ICD-10-CM | POA: Diagnosis not present

## 2018-03-11 MED ORDER — OSELTAMIVIR PHOSPHATE 75 MG PO CAPS: 75.0000 mg | ORAL_CAPSULE | Freq: Two times a day (BID) | ORAL | 0 refills | Status: DC

## 2018-03-11 MED ORDER — BENZONATATE 100 MG PO CAPS: 100.0000 mg | ORAL_CAPSULE | Freq: Three times a day (TID) | ORAL | 0 refills | Status: DC

## 2018-03-11 NOTE — Discharge Instructions (Signed)
Get plenty of rest and push fluids Tessalon Perles prescribed for cough Tamiflu prescribed.  Take as directed and to completion Use OTC medications like ibuprofen or tylenol as needed fever or pain Follow up with PCP if symptoms persist Return or go to ER if you have any new or worsening symptoms fever, chills, nausea, vomiting, chest pain, cough, shortness of breath, wheezing, abdominal pain, changes in bowel or bladder habits, etc..Marland Kitchen

## 2018-03-11 NOTE — ED Triage Notes (Signed)
Pt cc sore throat , headache, chest congestion and back pain. X 3 days

## 2018-03-11 NOTE — ED Provider Notes (Signed)
[place]   [id number] 03/11/18 Arrival Time: 6644   CC: Flu-like symptoms   SUBJECTIVE: History from: patient.  Jamie Kelley is a 63 y.o. female who presents with abrupt onset of nasal congestion, runny nose, sore throat, cough, and body aches x 3 days.  Denies known sick exposure or precipitating event.  Has tried OTC medications without relief.  Symptoms are made worse at night.  Denies previous symptoms in the past.   Complains of associated nausea, and vomiting.  Denies fever, chills, SOB, wheezing, chest pain, changes in bowel or bladder habits.    Received flu shot this year: Yes  ROS: As per HPI.  Past Medical History:  Diagnosis Date  . Hay fever   . High blood pressure   . Hyperlipidemia   . Obesity   . Thyroid disease    Past Surgical History:  Procedure Laterality Date  . BIOPSY THYROID     Dr Constance Holster- negative for cancer   No Known Allergies No current facility-administered medications on file prior to encounter.    Current Outpatient Medications on File Prior to Encounter  Medication Sig Dispense Refill  . albuterol (PROVENTIL HFA;VENTOLIN HFA) 108 (90 Base) MCG/ACT inhaler Inhale 1-2 puffs into the lungs every 4 (four) hours as needed for wheezing or shortness of breath. 1 Inhaler 0  . amLODipine (NORVASC) 10 MG tablet Take 1 tablet (10 mg total) by mouth daily. 90 tablet 1  . atenolol (TENORMIN) 50 MG tablet Take 2 tablets (100 mg total) by mouth daily. 180 tablet 1  . cetirizine (ZYRTEC) 10 MG tablet Take 10 mg by mouth daily as needed for allergies.    . fluticasone (FLONASE) 50 MCG/ACT nasal spray Place 1 spray into both nostrils 2 (two) times daily. 16 g 0  . hydrochlorothiazide (HYDRODIURIL) 25 MG tablet Take 1 tablet (25 mg total) by mouth daily. 90 tablet 1  . levothyroxine (SYNTHROID, LEVOTHROID) 50 MCG tablet TAKE 1/2 (ONE-HALF) TABLET BY MOUTH ONCE DAILY BEFORE BREAKFAST 45 tablet 3  . sertraline (ZOLOFT) 25 MG tablet Take 1 tablet (25 mg  total) by mouth daily. 30 tablet 3  . simvastatin (ZOCOR) 20 MG tablet Take 1 tablet (20 mg total) by mouth at bedtime. 90 tablet 1  . sodium chloride (OCEAN) 0.65 % SOLN nasal spray Place 2 sprays into both nostrils every 2 (two) hours while awake.  0   Social History   Socioeconomic History  . Marital status: Single    Spouse name: Not on file  . Number of children: Not on file  . Years of education: Not on file  . Highest education level: Not on file  Occupational History  . Not on file  Social Needs  . Financial resource strain: Not on file  . Food insecurity:    Worry: Not on file    Inability: Not on file  . Transportation needs:    Medical: Not on file    Non-medical: Not on file  Tobacco Use  . Smoking status: Never Smoker  . Smokeless tobacco: Never Used  Substance and Sexual Activity  . Alcohol use: No  . Drug use: No  . Sexual activity: Not on file  Lifestyle  . Physical activity:    Days per week: Not on file    Minutes per session: Not on file  . Stress: Not on file  Relationships  . Social connections:    Talks on phone: Not on file    Gets together: Not on file  Attends religious service: Not on file    Active member of club or organization: Not on file    Attends meetings of clubs or organizations: Not on file    Relationship status: Not on file  . Intimate partner violence:    Fear of current or ex partner: Not on file    Emotionally abused: Not on file    Physically abused: Not on file    Forced sexual activity: Not on file  Other Topics Concern  . Not on file  Social History Narrative  . Not on file   Family History  Problem Relation Age of Onset  . Heart disease Mother   . Hypertension Mother   . Diabetes Mother   . Alzheimer's disease Mother   . Cancer Maternal Aunt        breast  . Heart disease Maternal Aunt   . Hypertension Maternal Aunt   . Breast cancer Maternal Aunt 70  . Hypertension Maternal Uncle   . Heart disease  Maternal Uncle   . Heart disease Maternal Grandmother   . Hypertension Maternal Grandmother   . Diabetes Maternal Grandmother     OBJECTIVE:  Vitals:   03/11/18 1603 03/11/18 1604  BP:  138/76  Pulse:  71  Resp:  18  Temp:  98.7 F (37.1 C)  TempSrc:  Oral  SpO2:  99%  Weight: 200 lb (90.7 kg)      General appearance: alert; appears fatigued, but nontoxic; speaking in full sentences and tolerating own secretions HEENT: NCAT; Ears: EACs clear, TMs pearly gray; Eyes: PERRL.  EOM grossly intact. Sinuses: TTP; Nose: nares patent with clear rhinorrhea, Throat: oropharynx clear, tonsils non erythematous or enlarged, uvula midline  Neck: supple without LAD; thyromegaly  Lungs: unlabored respirations, symmetrical air entry; cough: absent; no respiratory distress; CTAB Heart: regular rate and rhythm.  Radial pulses 2+ symmetrical bilaterally Skin: warm and dry Psychological: alert and cooperative; normal mood and affect  ASSESSMENT & PLAN:  1. Influenza-like illness     Meds ordered this encounter  Medications  . benzonatate (TESSALON) 100 MG capsule    Sig: Take 1 capsule (100 mg total) by mouth every 8 (eight) hours.    Dispense:  21 capsule    Refill:  0    Order Specific Question:   Supervising Provider    Answer:   Raylene Everts [[id number]]  . oseltamivir (TAMIFLU) 75 MG capsule    Sig: Take 1 capsule (75 mg total) by mouth every 12 (twelve) hours.    Dispense:  10 capsule    Refill:  0    Order Specific Question:   Supervising Provider    Answer:   Raylene Everts [[id number]]    Get plenty of rest and push fluids Tessalon Perles prescribed for cough Tamiflu prescribed.  Take as directed and to completion Use OTC medications like ibuprofen or tylenol as needed fever or pain Follow up with PCP if symptoms persist Return or go to ER if you have any new or worsening symptoms fever, chills, nausea, vomiting, chest pain, cough, shortness of breath, wheezing, abdominal  pain, changes in bowel or bladder habits, etc...  Reviewed expectations re: course of current medical issues. Questions answered. Outlined signs and symptoms indicating need for more acute intervention. Patient verbalized understanding. After Visit Summary given.         Lestine Box, PA-C 03/11/18 1629

## 2018-03-14 ENCOUNTER — Encounter: Payer: Self-pay | Admitting: Family Medicine

## 2018-03-14 ENCOUNTER — Ambulatory Visit: Payer: PRIVATE HEALTH INSURANCE | Admitting: Family Medicine

## 2018-03-14 ENCOUNTER — Other Ambulatory Visit: Payer: Self-pay

## 2018-03-14 VITALS — BP 130/84 | HR 71 | Temp 99.0°F | Resp 18 | Ht 61.0 in | Wt 199.4 lb

## 2018-03-14 DIAGNOSIS — R6889 Other general symptoms and signs: Secondary | ICD-10-CM | POA: Diagnosis not present

## 2018-03-14 MED ORDER — GUAIFENESIN-CODEINE 100-10 MG/5ML PO SOLN: 5.0000 mL | Freq: Four times a day (QID) | ORAL | 0 refills | Status: DC | PRN

## 2018-03-14 NOTE — Patient Instructions (Signed)
Please follow up if symptoms do not improve or as needed.   Start the cough syrup at night.  Keep taking the alleve 2 twice a day and add tylenol in between if needed for back pain. Heat will help muscle spasms as will the flexeril.  You may use Delsym cough syrup or Mucinex DM to help with congestion and coughing.  Your lungs are clear today.

## 2018-03-14 NOTE — Progress Notes (Signed)
Subjective   CC:  Chief Complaint  Patient presents with  . Flu like symptoms    Was seen in the ED on 03/11/18, reports that she feels some better.Marland Kitchen Has back lower back pain   Same day acute visit; PCP not available. New pt to me. Chart reviewed.  Patient was in the urgent care on 1/14 as noted above.  Evaluated for flulike symptoms.  Treated for possible flu with Tamiflu and Tessalon Perles.  I reviewed note. HPI: Jamie Kelley is a 63 y.o. female who presents to the office today to address the problems listed above in the chief complaint. She is here today for follow-up because she has persistent coughing and now low back pain with muscle spasms.  She is using Aleve with mild relief.  She denies fevers, shortness of breath, productive cough.  She does request a work note. Assessment  1. Flu-like symptoms      Plan   Flulike illness: Treated appropriately.  We will add a stronger cough syrup to help with persistent hacking cough at night.  Continue Aleve and Tylenol for low back pain.  She does have Flexeril at home.  Use heating pad if needed.  Patient appears stable.  Reassured, give more time.  Follow-up if not improving over the next week.  Follow up: Return if symptoms worsen or fail to improve.   No orders of the defined types were placed in this encounter.  Meds ordered this encounter  Medications  . guaiFENesin-codeine 100-10 MG/5ML syrup    Sig: Take 5 mLs by mouth every 6 (six) hours as needed for cough.    Dispense:  120 mL    Refill:  0     I reviewed the patients updated PMH, FH, and SocHx.    Patient Active Problem List   Diagnosis Date Noted  . Depression 12/25/2017  . Contact dermatitis 07/06/2015  . Cellulitis 07/06/2015  . Chest pain 11/19/2014  . Thyromegaly 10/22/2014  . Hyperlipidemia 10/22/2014  . HTN (hypertension) 05/29/2012  . Hypothyroidism 05/29/2012  . Arthritis of both knees 05/29/2012   Current Meds  Medication Sig  . albuterol  (PROVENTIL HFA;VENTOLIN HFA) 108 (90 Base) MCG/ACT inhaler Inhale 1-2 puffs into the lungs every 4 (four) hours as needed for wheezing or shortness of breath.  Marland Kitchen amLODipine (NORVASC) 10 MG tablet Take 1 tablet (10 mg total) by mouth daily.  Marland Kitchen atenolol (TENORMIN) 50 MG tablet Take 2 tablets (100 mg total) by mouth daily.  . benzonatate (TESSALON) 100 MG capsule Take 1 capsule (100 mg total) by mouth every 8 (eight) hours.  . benzonatate (TESSALON) 100 MG capsule   . cetirizine (ZYRTEC) 10 MG tablet Take 10 mg by mouth daily as needed for allergies.  . hydrochlorothiazide (HYDRODIURIL) 25 MG tablet Take 1 tablet (25 mg total) by mouth daily.  Marland Kitchen levothyroxine (SYNTHROID, LEVOTHROID) 50 MCG tablet TAKE 1/2 (ONE-HALF) TABLET BY MOUTH ONCE DAILY BEFORE BREAKFAST  . oseltamivir (TAMIFLU) 75 MG capsule Take 1 capsule (75 mg total) by mouth every 12 (twelve) hours.  Marland Kitchen oseltamivir (TAMIFLU) 75 MG capsule   . sertraline (ZOLOFT) 25 MG tablet Take 1 tablet (25 mg total) by mouth daily.  . simvastatin (ZOCOR) 20 MG tablet Take 1 tablet (20 mg total) by mouth at bedtime.   Family History: Patient family history includes Alzheimer's disease in her mother; Breast cancer (age of onset: 43) in her maternal aunt; Cancer in her maternal aunt; Diabetes in her maternal grandmother and mother;  Heart disease in her maternal aunt, maternal grandmother, maternal uncle, and mother; Hypertension in her maternal aunt, maternal grandmother, maternal uncle, and mother. Social History:  Patient  reports that she has never smoked. She has never used smokeless tobacco. She reports that she does not drink alcohol or use drugs.  Review of Systems: Constitutional: negative for fever or malaise Ophthalmic: negative for photophobia, double vision or loss of vision Cardiovascular: negative for chest pain, dyspnea on exertion, or new LE swelling Respiratory: negative for SOB or persistent cough Gastrointestinal: negative for  abdominal pain, change in bowel habits or melena Genitourinary: negative for dysuria or gross hematuria Musculoskeletal: negative for new gait disturbance or muscular weakness Integumentary: negative for new or persistent rashes Neurological: negative for TIA or stroke symptoms Psychiatric: negative for SI or delusions Allergic/Immunologic: negative for hives  Objective  Vitals: BP 130/84   Pulse 71   Temp 99 F (37.2 C) (Oral)   Resp 18   Ht 5\' 1"  (1.549 m)   Wt 199 lb 6.4 oz (90.4 kg)   LMP 03/10/2012   SpO2 96%   BMI 37.68 kg/m  General: no acute respiratory distress, well-appearing Psych:  Alert and oriented, normal mood and affect HEENT: Normocephalic, nasal congestion present, TMs w/o erythema, OP with erythema w/o exudate, no LAD, supple neck  Cardiovascular:  RRR without murmur or gallop. no peripheral edema Respiratory:  Good breath sounds bilaterally, CTAB with normal respiratory effort, no rales or wheezing Skin:  Warm, no rashes Neurologic:   Mental status is normal. normal gait Back: Negative tenderness, negative straight leg raise bilaterally  Commons side effects, risks, benefits, and alternatives for medications and treatment plan prescribed today were discussed, and the patient expressed understanding of the given instructions. Patient is instructed to call or message via MyChart if he/she has any questions or concerns regarding our treatment plan. No barriers to understanding were identified. We discussed Red Flag symptoms and signs in detail. Patient expressed understanding regarding what to do in case of urgent or emergency type symptoms.   Medication list was reconciled, printed and provided to the patient in AVS. Patient instructions and summary information was reviewed with the patient as documented in the AVS. This note was prepared with assistance of Dragon voice recognition software. Occasional wrong-word or sound-a-like substitutions may have occurred due to  the inherent limitations of voice recognition software

## 2018-03-25 ENCOUNTER — Encounter: Payer: Self-pay | Admitting: Registered Nurse

## 2018-03-25 ENCOUNTER — Ambulatory Visit: Payer: Self-pay | Admitting: Registered Nurse

## 2018-03-25 VITALS — BP 122/78 | HR 78 | Temp 98.2°F

## 2018-03-25 DIAGNOSIS — J0101 Acute recurrent maxillary sinusitis: Secondary | ICD-10-CM

## 2018-03-25 DIAGNOSIS — J204 Acute bronchitis due to parainfluenza virus: Secondary | ICD-10-CM

## 2018-03-25 DIAGNOSIS — H6121 Impacted cerumen, right ear: Secondary | ICD-10-CM

## 2018-03-25 MED ORDER — DOXYCYCLINE HYCLATE 100 MG PO TABS: 100.0000 mg | ORAL_TABLET | Freq: Two times a day (BID) | ORAL | 0 refills | Status: AC

## 2018-03-25 MED ORDER — PREDNISONE 10 MG (21) PO TBPK: ORAL_TABLET | ORAL | 0 refills | Status: DC

## 2018-03-25 NOTE — Progress Notes (Signed)
Subjective:    Patient ID: Jamie Kelley, female    DOB: 12-20-1955, 63 y.o.   MRN: [id number]  63y/o African-American established female pt c/o productive cough x2 weeks. Was treated by UC and then pcp with Tessalon pearles, Tamiflu, guaifenesin-codeine cough syrup, albuterol inh (avg x2 uses daily). Pt reports cough has persisted and is keeping her up at night despite the cough syrup.  Has hot and cold episodes.  Denies any other flu-like sx that she was previously treated for. Her biggest concern is the productive cough and chest congestion. Has tolerated amoxicillin/azithromycin and doxycycline in the past with prednisone taper for sinusitis/bronchitis. + sick contacts at work viral URI     Review of Systems  Constitutional: Positive for fatigue. Negative for activity change, appetite change, chills, diaphoresis, fever and unexpected weight change.  HENT: Positive for congestion, postnasal drip and sinus pressure. Negative for dental problem, drooling, ear discharge, ear pain, facial swelling, hearing loss, mouth sores, nosebleeds, rhinorrhea, sinus pain, sneezing, sore throat, tinnitus, trouble swallowing and voice change.   Eyes: Negative for photophobia, pain, discharge, redness, itching and visual disturbance.  Respiratory: Positive for cough. Negative for choking, chest tightness, shortness of breath, wheezing and stridor.   Cardiovascular: Negative for chest pain, palpitations and leg swelling.  Gastrointestinal: Negative for abdominal pain, diarrhea, nausea and vomiting.  Endocrine: Negative for cold intolerance and heat intolerance.  Genitourinary: Negative for difficulty urinating, dysuria and hematuria.  Musculoskeletal: Negative for arthralgias, back pain, gait problem, joint swelling, myalgias, neck pain and neck stiffness.  Skin: Negative for color change, pallor, rash and wound.  Allergic/Immunologic: Positive for environmental allergies. Negative for food allergies.   Neurological: Negative for dizziness, tremors, seizures, syncope, facial asymmetry, speech difficulty, weakness, light-headedness, numbness and headaches.  Hematological: Negative for adenopathy. Does not bruise/bleed easily.  Psychiatric/Behavioral: Positive for sleep disturbance. Negative for agitation, behavioral problems and confusion.       Objective:   Physical Exam Vitals signs and nursing note reviewed.  Constitutional:      General: She is awake. She is not in acute distress.    Appearance: Normal appearance. She is well-developed and well-groomed. She is ill-appearing. She is not toxic-appearing or diaphoretic.  HENT:     Head: Normocephalic and atraumatic.     Jaw: There is normal jaw occlusion. No trismus.     Salivary Glands: Right salivary gland is not diffusely enlarged or tender. Left salivary gland is not diffusely enlarged or tender.     Right Ear: Hearing, ear canal and external ear normal. A middle ear effusion is present. There is impacted cerumen.     Left Ear: Hearing, ear canal and external ear normal. A middle ear effusion is present.     Nose: Mucosal edema, congestion and rhinorrhea present. No nasal deformity, septal deviation or laceration.     Right Turbinates: Enlarged and swollen.     Left Turbinates: Enlarged and swollen.     Right Sinus: Maxillary sinus tenderness and frontal sinus tenderness present.     Left Sinus: Maxillary sinus tenderness and frontal sinus tenderness present.     Mouth/Throat:     Lips: Pink. No lesions.     Mouth: Mucous membranes are moist. Mucous membranes are not pale, not dry and not cyanotic. No lacerations, oral lesions or angioedema.     Dentition: Normal dentition. Does not have dentures. No dental caries or dental abscesses.     Tongue: No lesions.     Pharynx: Uvula midline. Pharyngeal  swelling and posterior oropharyngeal erythema present. No oropharyngeal exudate or uvula swelling.     Tonsils: No tonsillar exudate or  tonsillar abscesses. Swelling: 0 on the right. 0 on the left.  Eyes:     General: Lids are normal. Allergic shiner present. No visual field deficit or scleral icterus.       Right eye: No foreign body, discharge or hordeolum.        Left eye: No foreign body, discharge or hordeolum.     Extraocular Movements: Extraocular movements intact.     Right eye: Normal extraocular motion and no nystagmus.     Left eye: Normal extraocular motion and no nystagmus.     Conjunctiva/sclera: Conjunctivae normal.     Right eye: Right conjunctiva is not injected. No chemosis, exudate or hemorrhage.    Left eye: Left conjunctiva is not injected. No chemosis, exudate or hemorrhage.    Pupils: Pupils are equal, round, and reactive to light. Pupils are equal.     Right eye: Pupil is round and reactive.     Left eye: Pupil is round and reactive.  Neck:     Musculoskeletal: Normal range of motion and neck supple. Normal range of motion. No edema, erythema, neck rigidity, crepitus or muscular tenderness.     Thyroid: No thyroid mass or thyromegaly.     Trachea: Trachea and phonation normal. No tracheal tenderness or tracheal deviation.  Cardiovascular:     Rate and Rhythm: Normal rate and regular rhythm.     Chest Wall: PMI is not displaced.     Heart sounds: Normal heart sounds, S1 normal and S2 normal. No murmur. No friction rub. No gallop.   Pulmonary:     Effort: Pulmonary effort is normal. No accessory muscle usage or respiratory distress.     Breath sounds: Decreased air movement present. No stridor or transmitted upper airway sounds. Examination of the right-lower field reveals decreased breath sounds. Examination of the left-lower field reveals decreased breath sounds. Decreased breath sounds present. No wheezing, rhonchi or rales.  Chest:     Chest wall: No tenderness.  Abdominal:     General: There is no distension.     Palpations: Abdomen is soft.  Musculoskeletal: Normal range of motion.         General: No tenderness.     Right shoulder: Normal.     Left shoulder: Normal.     Right elbow: Normal.    Left elbow: Normal.     Right hip: Normal.     Left hip: Normal.     Right knee: Normal.     Left knee: Normal.     Cervical back: Normal.     Thoracic back: Normal.     Lumbar back: Normal.     Right hand: Normal.     Left hand: Normal.     Right lower leg: No edema.     Left lower leg: No edema.  Lymphadenopathy:     Head:     Right side of head: No submental, submandibular, tonsillar, preauricular, posterior auricular or occipital adenopathy.     Left side of head: No submental, submandibular, tonsillar, preauricular, posterior auricular or occipital adenopathy.     Cervical: No cervical adenopathy.     Right cervical: No superficial, deep or posterior cervical adenopathy.    Left cervical: No superficial, deep or posterior cervical adenopathy.  Skin:    General: Skin is warm and dry.     Capillary Refill: Capillary refill takes  less than 2 seconds.     Coloration: Skin is not ashen, cyanotic, jaundiced, mottled, pale or sallow.     Findings: No abrasion, bruising, burn, ecchymosis, erythema, laceration, lesion, petechiae or rash.     Nails: There is no clubbing.   Neurological:     General: No focal deficit present.     Mental Status: She is alert and oriented to person, place, and time. She is not disoriented.     GCS: GCS eye subscore is 4. GCS verbal subscore is 5. GCS motor subscore is 6.     Cranial Nerves: Cranial nerves are intact. No cranial nerve deficit, dysarthria or facial asymmetry.     Sensory: Sensation is intact. No sensory deficit.     Motor: Motor function is intact. No weakness, tremor, atrophy, abnormal muscle tone or seizure activity.     Coordination: Coordination is intact. Coordination normal.     Gait: Gait is intact. Gait normal.     Comments: In/out of chair and on/off exam table without difficulty; gait sure and steady in hallway   Psychiatric:        Speech: Speech normal.        Behavior: Behavior normal. Behavior is cooperative.        Thought Content: Thought content normal.        Judgment: Judgment normal.      Hard wax right auditory canal 100% occlusion tried curettage and minimal cerumen removed; bilateral allergic shiners; lower eyelids nonpitting edema 1+/4 bilaterally; maxillary greater than frontal sinuses TTP bilaterally; cobblestoning posterior pharynx; clear discharge bilateral nasal turbinates edema erythema; frequent nonproductive cough in exam room; decreased breath sounds bilateral middle and lower lobes     Assessment & Plan:  A-acute bronchitis, recurrent acute maxillary sinusitis, impacted cerumen right  P-Rx Doxycycline 100mg  po BID x 10 days #20 RF0 dispensed from [facility]. Discussed to use sunscreen/protective clothing as increased risk of sunburn and most common side effect stomach upset take medication with food. continue flonase 1 spray each nostril BID, saline 2 sprays each nostril q2h wa prn congestion.  Patient has taken amoxicillin and azithromycin in the past for sinusitis had 3 episodes in 1 year with repeat prednisone tapers and only cleared with doxycycline despite flonase/antihistamine use.  Denied personal or family history of ENT cancer.  Shower BID especially prior to bed. No evidence of systemic bacterial infection, non toxic and well hydrated.  I do not see where any further testing or imaging is necessary at this time.   I will suggest supportive care, rest, good hygiene and encourage the patient to take adequate fluids.  The patient is to return to clinic or EMERGENCY ROOM if symptoms worsen or change significantly.  Exitcare handout on sinusitis and sinus rinse.  Patient verbalized agreement and understanding of treatment plan and had no further questions at this time.   P2:  Hand washing and cover cough   Discussed post nasal drip probable source of cough.  Cough lozenges po q2h  prn cough  Prednisone taper 10mg  (60/50/40/30/20/10mg ) po daily with breakfast #21 RF0 dispensed from [facility].  Discussed possible side effects increased/decreased appetite, difficulty sleeping, increased blood sugar, increased blood pressure and heart rate.  Albuterol MDI 75mcg 1-2 puffs po q4-6h prn protracted cough/wheeze at home using BID reinforced to continue.  Rx from [facility] 100mg  po TID prn cough may continue along with prescription cough medicine prn. Bronchitis simple, community acquired, may have started as viral (probably respiratory syncytial, parainfluenza,  influenza, or adenovirus), but now evidence of acute purulent bronchitis with resultant bronchial edema and mucus formation.  Viruses are the most common cause of bronchial inflammation in otherwise healthy adults with acute bronchitis.  The appearance of sputum is not predictive of whether a bacterial infection is present.  Purulent sputum is most often caused by viral infections.  There are a small portion of those caused by non-viral agents being Mycoplama pneumonia.  Microscopic examination or C&S of sputum in the healthy adult with acute bronchitis is generally not helpful (usually negative or normal respiratory flora) other considerations being cough from upper respiratory tract infections, sinusitis or allergic syndromes (mild asthma or viral pneumonia).  Differential Diagnoses:  reactive airway disease (asthma, allergic aspergillosis (eosinophilia), chronic bronchitis, respiratory infection (sinusitis, common cold, pneumonia), congestive heart failure, reflux esophagitis, bronchogenic tumor, aspiration syndromes and/or exposure to pulmonary irritants/smoke.   Without high fever, severe dyspnea, lack of physical findings or other risk factors, I will hold on a chest radiograph and CBC at this time.  I discussed that approximately 50% of patients with acute bronchitis have a cough that lasts up to three weeks, and 25% for over a  month.  Tylenol 500mg  one to two tablets every four to six hours as needed for fever or myalgias.  No aspirin. Exitcare handout on bronchitis and inhaler use given to patient.  ER if hemopthysis, SOB, worst chest pain of life.   Patient instructed to follow up in one week or sooner if symptoms worsen.  Patient verbalized agreement and understanding of treatment plan.  P2:  hand washing and cover cough  Patient reported slight discomfort external ear canal after procedure curretage extraction right ear  Try 5-10gtts mineral oil external auditory canal daily and may put cotton ball at external ear for 20 minutes after drops instilled.  When in clinic have provider check ears to see if buildup with this method again.  Discussed purpose of earwax with patient.  Avoid cotton applicator (Q-tip) use in ears.  exitcare handout on cerumen impaction  Patient verbalized understanding, agreed with plan of care and had no further questions at this time.

## 2018-03-25 NOTE — Patient Instructions (Signed)
Earwax Buildup, Adult The ears produce a substance called earwax that helps keep bacteria out of the ear and protects the skin in the ear canal. Occasionally, earwax can build up in the ear and cause discomfort or hearing loss. What increases the risk? This condition is more likely to develop in people who:  Are female.  Are elderly.  Naturally produce more earwax.  Clean their ears often with cotton swabs.  Use earplugs often.  Use in-ear headphones often.  Wear hearing aids.  Have narrow ear canals.  Have earwax that is overly thick or sticky.  Have eczema.  Are dehydrated.  Have excess hair in the ear canal. What are the signs or symptoms? Symptoms of this condition include:  Reduced or muffled hearing.  A feeling of fullness in the ear or feeling that the ear is plugged.  Fluid coming from the ear.  Ear pain.  Ear itch.  Ringing in the ear.  Coughing.  An obvious piece of earwax that can be seen inside the ear canal. How is this diagnosed? This condition may be diagnosed based on:  Your symptoms.  Your medical history.  An ear exam. During the exam, your health care provider will look into your ear with an instrument called an otoscope. You may have tests, including a hearing test. How is this treated? This condition may be treated by:  Using ear drops to soften the earwax.  Having the earwax removed by a health care provider. The health care provider may: ? Flush the ear with water. ? Use an instrument that has a loop on the end (curette). ? Use a suction device.  Surgery to remove the wax buildup. This may be done in severe cases. Follow these instructions at home:   Take over-the-counter and prescription medicines only as told by your health care provider.  Do not put any objects, including cotton swabs, into your ear. You can clean the opening of your ear canal with a washcloth or facial tissue.  Follow instructions from your health care  provider about cleaning your ears. Do not over-clean your ears.  Drink enough fluid to keep your urine clear or pale yellow. This will help to thin the earwax.  Keep all follow-up visits as told by your health care provider. If earwax builds up in your ears often or if you use hearing aids, consider seeing your health care provider for routine, preventive ear cleanings. Ask your health care provider how often you should schedule your cleanings.  If you have hearing aids, clean them according to instructions from the manufacturer and your health care provider. Contact a health care provider if:  You have ear pain.  You develop a fever.  You have blood, pus, or other fluid coming from your ear.  You have hearing loss.  You have ringing in your ears that does not go away.  Your symptoms do not improve with treatment.  You feel like the room is spinning (vertigo). Summary  Earwax can build up in the ear and cause discomfort or hearing loss.  The most common symptoms of this condition include reduced or muffled hearing and a feeling of fullness in the ear or feeling that the ear is plugged.  This condition may be diagnosed based on your symptoms, your medical history, and an ear exam.  This condition may be treated by using ear drops to soften the earwax or by having the earwax removed by a health care provider.  Do not put any   objects, including cotton swabs, into your ear. You can clean the opening of your ear canal with a washcloth or facial tissue. This information is not intended to replace advice given to you by your health care provider. Make sure you discuss any questions you have with your health care provider. Document Released: 03/22/2004 Document Revised: 01/24/2017 Document Reviewed: 04/25/2016 Elsevier Interactive Patient Education  2019 [employer]. How to Perform a Sinus Rinse A sinus rinse is a home treatment that is used to rinse your sinuses with a sterile mixture  of salt and water (saline solution). Sinuses are air-filled spaces in your skull behind the bones of your face and forehead that open into your nasal cavity. A sinus rinse can help to clear mucus, dirt, dust, or pollen from your nasal cavity. You may do a sinus rinse when you have a cold, a virus, nasal allergy symptoms, a sinus infection, or stuffiness in your nose or sinuses. Talk with your health care provider about whether a sinus rinse might help you. What are the risks? A sinus rinse is generally safe and effective. However, there are a few risks, which include:  A burning sensation in your sinuses. This may happen if you do not make the saline solution as directed. Be sure to follow all directions when making the saline solution.  Nasal irritation.  Infection from contaminated water. This is rare, but possible. Do not do a sinus rinse if you have had ear or nasal surgery, ear infection, or blocked ears. Supplies needed:  Saline solution or powder.  Distilled or sterile water may be needed to mix with saline powder. ? You may use boiled and cooled tap water. Boil tap water for 5 minutes; cool until it is lukewarm. Use within 24 hours. ? Do not use regular tap water to mix with the saline solution.  Neti pot or nasal rinse bottle. These supplies release the saline solution into your nose and through your sinuses. Neti pots and nasal rinse bottles can be purchased at [employer], a health food store, or online. How to perform a sinus rinse  1. Wash your hands with soap and water. 2. Wash your device according to the directions that came with the product and then dry it. 3. Use the solution that comes with your product or one that is sold separately in stores. Follow the mixing directions on the package if you need to mix with sterile or distilled water. 4. Fill the device with the amount of saline solution noted in the device instructions. 5. Stand over a sink and tilt your  head sideways over the sink. 6. Place the spout of the device in your upper nostril (the one closer to the ceiling). 7. Gently pour or squeeze the saline solution into your nasal cavity. The liquid should drain out from the lower nostril if you are not too congested. 8. While rinsing, breathe through your open mouth. 9. Gently blow your nose to clear any mucus and rinse solution. Blowing too hard may cause ear pain. 10. Repeat in your other nostril. 11. Clean and rinse your device with clean water and then air-dry it. Talk with your health care provider or pharmacist if you have questions about how to do a sinus rinse. Summary  A sinus rinse is a home treatment that is used to rinse your sinuses with a sterile mixture of salt and water (saline solution).  A sinus rinse is generally safe and effective. Follow all instructions carefully.  Before  doing a sinus rinse, talk with your health care provider about whether it would be helpful for you. This information is not intended to replace advice given to you by your health care provider. Make sure you discuss any questions you have with your health care provider. Document Released: 09/09/2013 Document Revised: 12/10/2016 Document Reviewed: 12/10/2016 Elsevier Interactive Patient Education  2019 [employer] Sinusitis, Adult Sinusitis is inflammation of your sinuses. Sinuses are hollow spaces in the bones around your face. Your sinuses are located:  Around your eyes.  In the middle of your forehead.  Behind your nose.  In your cheekbones. Mucus normally drains out of your sinuses. When your nasal tissues become inflamed or swollen, mucus can become trapped or blocked. This allows bacteria, viruses, and fungi to grow, which leads to infection. Most infections of the sinuses are caused by a virus. Sinusitis can develop quickly. It can last for up to 4 weeks (acute) or for more than 12 weeks (chronic). Sinusitis often develops after a  cold. What are the causes? This condition is caused by anything that creates swelling in the sinuses or stops mucus from draining. This includes:  Allergies.  Asthma.  Infection from bacteria or viruses.  Deformities or blockages in your nose or sinuses.  Abnormal growths in the nose (nasal polyps).  Pollutants, such as chemicals or irritants in the air.  Infection from fungi (rare). What increases the risk? You are more likely to develop this condition if you:  Have a weak body defense system (immune system).  Do a lot of swimming or diving.  Overuse nasal sprays.  Smoke. What are the signs or symptoms? The main symptoms of this condition are pain and a feeling of pressure around the affected sinuses. Other symptoms include:  Stuffy nose or congestion.  Thick drainage from your nose.  Swelling and warmth over the affected sinuses.  Headache.  Upper toothache.  A cough that may get worse at night.  Extra mucus that collects in the throat or the back of the nose (postnasal drip).  Decreased sense of smell and taste.  Fatigue.  A fever.  Sore throat.  Bad breath. How is this diagnosed? This condition is diagnosed based on:  Your symptoms.  Your medical history.  A physical exam.  Tests to find out if your condition is acute or chronic. This may include: ? Checking your nose for nasal polyps. ? Viewing your sinuses using a device that has a light (endoscope). ? Testing for allergies or bacteria. ? Imaging tests, such as an MRI or CT scan. In rare cases, a bone biopsy may be done to rule out more serious types of fungal sinus disease. How is this treated? Treatment for sinusitis depends on the cause and whether your condition is chronic or acute.  If caused by a virus, your symptoms should go away on their own within 10 days. You may be given medicines to relieve symptoms. They include: ? Medicines that shrink swollen nasal passages (topical  intranasal decongestants). ? Medicines that treat allergies (antihistamines). ? A spray that eases inflammation of the nostrils (topical intranasal corticosteroids). ? Rinses that help get rid of thick mucus in your nose (nasal saline washes).  If caused by bacteria, your health care provider may recommend waiting to see if your symptoms improve. Most bacterial infections will get better without antibiotic medicine. You may be given antibiotics if you have: ? A severe infection. ? A weak immune system.  If caused by narrow nasal  passages or nasal polyps, you may need to have surgery. Follow these instructions at home: Medicines  Take, use, or apply over-the-counter and prescription medicines only as told by your health care provider. These may include nasal sprays.  If you were prescribed an antibiotic medicine, take it as told by your health care provider. Do not stop taking the antibiotic even if you start to feel better. Hydrate and humidify   Drink enough fluid to keep your urine pale yellow. Staying hydrated will help to thin your mucus.  Use a cool mist humidifier to keep the humidity level in your home above 50%.  Inhale steam for 10-15 minutes, 3-4 times a day, or as told by your health care provider. You can do this in the bathroom while a hot shower is running.  Limit your exposure to cool or dry air. Rest  Rest as much as possible.  Sleep with your head raised (elevated).  Make sure you get enough sleep each night. General instructions   Apply a warm, moist washcloth to your face 3-4 times a day or as told by your health care provider. This will help with discomfort.  Wash your hands often with soap and water to reduce your exposure to germs. If soap and water are not available, use hand sanitizer.  Do not smoke. Avoid being around people who are smoking (secondhand smoke).  Keep all follow-up visits as told by your health care provider. This is  important. Contact a health care provider if:  You have a fever.  Your symptoms get worse.  Your symptoms do not improve within 10 days. Get help right away if:  You have a severe headache.  You have persistent vomiting.  You have severe pain or swelling around your face or eyes.  You have vision problems.  You develop confusion.  Your neck is stiff.  You have trouble breathing. Summary  Sinusitis is soreness and inflammation of your sinuses. Sinuses are hollow spaces in the bones around your face.  This condition is caused by nasal tissues that become inflamed or swollen. The swelling traps or blocks the flow of mucus. This allows bacteria, viruses, and fungi to grow, which leads to infection.  If you were prescribed an antibiotic medicine, take it as told by your health care provider. Do not stop taking the antibiotic even if you start to feel better.  Keep all follow-up visits as told by your health care provider. This is important. This information is not intended to replace advice given to you by your health care provider. Make sure you discuss any questions you have with your health care provider. Document Released: 02/12/2005 Document Revised: 07/15/2017 Document Reviewed: 07/15/2017 Elsevier Interactive Patient Education  2019 [employer] Acute Bronchitis, Adult  Acute bronchitis is sudden (acute) swelling of the air tubes (bronchi) in the lungs. Acute bronchitis causes these tubes to fill with mucus, which can make it hard to breathe. It can also cause coughing or wheezing. In adults, acute bronchitis usually goes away within 2 weeks. A cough caused by bronchitis may last up to 3 weeks. Smoking, allergies, and asthma can make the condition worse. Repeated episodes of bronchitis may cause further lung problems, such as chronic obstructive pulmonary disease (COPD). What are the causes? This condition can be caused by germs and by substances that irritate the lungs,  including:  Cold and flu viruses. This condition is most often caused by the same virus that causes a cold.  Bacteria.  Exposure to  tobacco smoke, dust, fumes, and air pollution. What increases the risk? This condition is more likely to develop in people who:  Have close contact with someone with acute bronchitis.  Are exposed to lung irritants, such as tobacco smoke, dust, fumes, and vapors.  Have a weak immune system.  Have a respiratory condition such as asthma. What are the signs or symptoms? Symptoms of this condition include:  A cough.  Coughing up clear, yellow, or green mucus.  Wheezing.  Chest congestion.  Shortness of breath.  A fever.  Body aches.  Chills.  A sore throat. How is this diagnosed? This condition is usually diagnosed with a physical exam. During the exam, your health care provider may order tests, such as chest X-rays, to rule out other conditions. He or she may also:  Test a sample of your mucus for bacterial infection.  Check the level of oxygen in your blood. This is done to check for pneumonia.  Do a chest X-ray or lung function testing to rule out pneumonia and other conditions.  Perform blood tests. Your health care provider will also ask about your symptoms and medical history. How is this treated? Most cases of acute bronchitis clear up over time without treatment. Your health care provider may recommend:  Drinking more fluids. Drinking more makes your mucus thinner, which may make it easier to breathe.  Taking a medicine for a fever or cough.  Taking an antibiotic medicine.  Using an inhaler to help improve shortness of breath and to control a cough.  Using a cool mist vaporizer or humidifier to make it easier to breathe. Follow these instructions at home: Medicines  Take over-the-counter and prescription medicines only as told by your health care provider.  If you were prescribed an antibiotic, take it as told by your  health care provider. Do not stop taking the antibiotic even if you start to feel better. General instructions   Get plenty of rest.  Drink enough fluids to keep your urine pale yellow.  Avoid smoking and secondhand smoke. Exposure to cigarette smoke or irritating chemicals will make bronchitis worse. If you smoke and you need help quitting, ask your health care provider. Quitting smoking will help your lungs heal faster.  Use an inhaler, cool mist vaporizer, or humidifier as told by your health care provider.  Keep all follow-up visits as told by your health care provider. This is important. How is this prevented? To lower your risk of getting this condition again:  Wash your hands often with soap and water. If soap and water are not available, use hand sanitizer.  Avoid contact with people who have cold symptoms.  Try not to touch your hands to your mouth, nose, or eyes.  Make sure to get the flu shot every year. Contact a health care provider if:  Your symptoms do not improve in 2 weeks of treatment. Get help right away if:  You cough up blood.  You have chest pain.  You have severe shortness of breath.  You become dehydrated.  You faint or keep feeling like you are going to faint.  You keep vomiting.  You have a severe headache.  Your fever or chills gets worse. This information is not intended to replace advice given to you by your health care provider. Make sure you discuss any questions you have with your health care provider. Document Released: 03/22/2004 Document Revised: 09/26/2016 Document Reviewed: 08/03/2015 Elsevier Interactive Patient Education  2019 [employer]. How to Use  a Metered Dose Inhaler A metered dose inhaler is a handheld device for taking medicine that must be breathed into the lungs (inhaled). The device can be used to deliver a variety of inhaled medicines, including:  Quick relief or rescue medicines, such as  bronchodilators.  Controller medicines, such as corticosteroids. The medicine is delivered by pushing down on a metal canister to release a preset amount of spray and medicine. Each device contains the amount of medicine that is needed for a preset number of uses (inhalations). Your health care provider may recommend that you use a spacer with your inhaler to help you take the medicine more effectively. A spacer is a plastic tube with a mouthpiece on one end and an opening that connects to the inhaler on the other end. A spacer holds the medicine in a tube for a short time, which allows you to inhale more medicine. What are the risks? If you do not use your inhaler correctly, medicine might not reach your lungs to help you breathe. Inhaler medicine can cause side effects, such as:  Mouth or throat infection.  Cough.  Hoarseness.  Headache.  Nausea and vomiting.  Lung infection (pneumonia) in people who have a lung condition called COPD. How to use a metered dose inhaler without a spacer  12. Remove the cap from the inhaler. 13. If you are using the inhaler for the first time, shake it for 5 seconds, turn it away from your face, then release 4 puffs into the air. This is called priming. 14. Shake the inhaler for 5 seconds. 15. Position the inhaler so the top of the canister faces up. 16. Put your index finger on the top of the medicine canister. Support the bottom of the inhaler with your thumb. 17. Breathe out normally and as completely as possible, away from the inhaler. 18. Either place the inhaler between your teeth and close your lips tightly around the mouthpiece, or hold the inhaler 1-2 inches (2.5-5 cm) away from your open mouth. Keep your tongue down out of the way. If you are unsure which technique to use, ask your health care provider. 19. Press the canister down with your index finger to release the medicine, then inhale deeply and slowly through your mouth (not your nose)  until your lungs are completely filled. Inhaling should take 4-6 seconds. 20. Hold the medicine in your lungs for 5-10 seconds (10 seconds is best). This helps the medicine get into the small airways of your lungs. 21. With your lips in a tight circle (pursed), breathe out slowly. 22. Repeat steps 3-10 until you have taken the number of puffs that your health care provider directed. Wait about 1 minute between puffs or as directed. 23. Put the cap on the inhaler. 24. If you are using a steroid inhaler, rinse your mouth with water, gargle, and spit out the water. Do not swallow the water. How to use a metered dose inhaler with a spacer  1. Remove the cap from the inhaler. 2. If you are using the inhaler for the first time, shake it for 5 seconds, turn it away from your face, then release 4 puffs into the air. This is called priming. 3. Shake the inhaler for 5 seconds. 4. Place the open end of the spacer onto the inhaler mouthpiece. 5. Position the inhaler so the top of the canister faces up and the spacer mouthpiece faces you. 6. Put your index finger on the top of the medicine canister. Support the  bottom of the inhaler and the spacer with your thumb. 7. Breathe out normally and as completely as possible, away from the spacer. 8. Place the spacer between your teeth and close your lips tightly around it. Keep your tongue down out of the way. 9. Press the canister down with your index finger to release the medicine, then inhale deeply and slowly through your mouth (not your nose) until your lungs are completely filled. Inhaling should take 4-6 seconds. 10. Hold the medicine in your lungs for 5-10 seconds (10 seconds is best). This helps the medicine get into the small airways of your lungs. 11. With your lips in a tight circle (pursed), breathe out slowly. 12. Repeat steps 3-11 until you have taken the number of puffs that your health care provider directed. Wait about 1 minute between puffs or as  directed. 13. Remove the spacer from the inhaler and put the cap on the inhaler. 14. If you are using a steroid inhaler, rinse your mouth with water, gargle, and spit out the water. Do not swallow the water. Follow these instructions at home:  Take your inhaled medicine only as told by your health care provider. Do not use the inhaler more than directed by your health care provider.  Keep all follow-up visits as told by your health care provider. This is important.  If your inhaler has a counter, you can check it to determine how full your inhaler is. If your inhaler does not have a counter, ask your health care provider when you will need to refill your inhaler and write the refill date on a calendar or on your inhaler canister. Note that you cannot know when an inhaler is empty by shaking it.  Follow directions on the package insert for care and cleaning of your inhaler and spacer. Contact a health care provider if:  Symptoms are only partially relieved with your inhaler.  You are having trouble using your inhaler.  You have an increase in phlegm.  You have headaches. Get help right away if:  You feel little or no relief after using your inhaler.  You have dizziness.  You have a fast heart rate.  You have chills or a fever.  You have night sweats.  There is blood in your phlegm. Summary  A metered dose inhaler is a handheld device for taking medicine that must be breathed into the lungs (inhaled).  The medicine is delivered by pushing down on a metal canister to release a preset amount of spray and medicine.  Each device contains the amount of medicine that is needed for a preset number of uses (inhalations). This information is not intended to replace advice given to you by your health care provider. Make sure you discuss any questions you have with your health care provider. Document Released: 02/12/2005 Document Revised: 09/03/2016 Document Reviewed:  01/03/2016 Elsevier Interactive Patient Education  2019 [employer].

## 2018-04-03 ENCOUNTER — Ambulatory Visit: Payer: Self-pay | Admitting: Registered Nurse

## 2018-04-03 VITALS — BP 121/82 | Temp 98.5°F

## 2018-04-03 DIAGNOSIS — J0101 Acute recurrent maxillary sinusitis: Secondary | ICD-10-CM

## 2018-04-03 DIAGNOSIS — J209 Acute bronchitis, unspecified: Secondary | ICD-10-CM

## 2018-04-03 MED ORDER — BENZONATATE 200 MG PO CAPS: 200.0000 mg | ORAL_CAPSULE | Freq: Three times a day (TID) | ORAL | 0 refills | Status: AC | PRN

## 2018-04-03 MED ORDER — SALINE SPRAY 0.65 % NA SOLN: 2.0000 | NASAL | 0 refills | Status: DC

## 2018-04-03 MED ORDER — PREDNISONE 10 MG PO TABS: ORAL_TABLET | ORAL | 0 refills | Status: AC

## 2018-04-03 NOTE — Progress Notes (Signed)
Subjective:    Patient ID: Jamie Kelley, female    DOB: Sep 25, 1955, 63 y.o.   MRN: [id number]  62y/o African American female established pt c/o continued cough and chest congestion. Finished Prednisone course yesterday. Has one day remaining on abx.Instilled baby oil right canal once since last visit; sore throat and pressure bothering her the most  Is feeling better overall OV PCM 03/11/2018, 03/14/2018 and 03/25/2018 with me  Initially diagnosed with flu given tamiflu and tessalon pearles; then guiafenesen with codeine syrup for cough keeping her up at night; started doxycycline 100mg  po BID and prednisone 60mg  taper on 1/28.  Is using nasal saline typically twice a day; almost has run out of tessalon and codeine syrup would like refills.  Discussed with patient unable to refill codeine cough medicine at [facility] clinic contract limitation on narcotics and to contact her PCM/original ordering provider for refill but will send in electronic Rx for tessalon pearles to her pharmacy of choice.  Patient stated increased inhaler use to three times a day after seeing clinic nurse earlier this week and was told some "rattling in chest" and increase her albuterol use to TID.  Patient denied productive cough/fever/ear discharge/n/v/d/wheezing.     Review of Systems  Constitutional: Positive for appetite change and fatigue. Negative for activity change, chills, diaphoresis and fever.  HENT: Positive for congestion, postnasal drip, rhinorrhea, sinus pressure, sinus pain and sore throat. Negative for dental problem, drooling, ear discharge, ear pain, facial swelling, hearing loss, mouth sores, nosebleeds, sneezing, tinnitus, trouble swallowing and voice change.   Eyes: Negative for photophobia and visual disturbance.  Respiratory: Positive for cough. Negative for shortness of breath, wheezing and stridor.   Cardiovascular: Negative for chest pain, palpitations and leg swelling.  Gastrointestinal: Negative for abdominal  pain, diarrhea, nausea and vomiting.  Endocrine: Negative for cold intolerance and heat intolerance.  Genitourinary: Negative for difficulty urinating.  Musculoskeletal: Negative for gait problem, joint swelling, myalgias, neck pain and neck stiffness.  Allergic/Immunologic: Positive for environmental allergies. Negative for food allergies.  Neurological: Positive for headaches. Negative for dizziness, tremors, seizures, syncope, facial asymmetry, speech difficulty, weakness, light-headedness and numbness.  Hematological: Negative for adenopathy. Does not bruise/bleed easily.  Psychiatric/Behavioral: Negative for agitation and confusion.       Objective:   Physical Exam Vitals signs and nursing note reviewed.  Constitutional:      General: She is awake. She is not in acute distress.    Appearance: Normal appearance. She is well-developed and well-groomed. She is obese. She is not ill-appearing, toxic-appearing or diaphoretic.  HENT:     Head: Normocephalic and atraumatic.     Jaw: There is normal jaw occlusion. No trismus.     Salivary Glands: Right salivary gland is not diffusely enlarged or tender. Left salivary gland is not diffusely enlarged or tender.     Right Ear: Hearing, ear canal and external ear normal. A middle ear effusion is present. There is impacted cerumen.     Left Ear: Hearing, ear canal and external ear normal. A middle ear effusion is present.     Nose: Mucosal edema and rhinorrhea present. No nasal deformity, septal deviation or laceration.     Right Turbinates: Enlarged and swollen. Not pale.     Left Turbinates: Enlarged and swollen. Not pale.     Right Sinus: Maxillary sinus tenderness and frontal sinus tenderness present.     Left Sinus: Maxillary sinus tenderness and frontal sinus tenderness present.     Comments: Maxillary  greater than frontal TTP bilaterally exquisitely; cobblestoning posterior pharynx; bilateral allergic shiners; right cerumen impaction; left  TM air fluid level clear; clear discharge bilateral nasal turbinates edema/erythema; lower eyelid nonpitting edema improved 0-1+/4 today    Mouth/Throat:     Lips: Pink. No lesions.     Mouth: Mucous membranes are moist. Mucous membranes are not pale, not dry and not cyanotic. No lacerations, oral lesions or angioedema.     Dentition: Normal dentition. Does not have dentures. No dental caries or dental abscesses.     Tongue: No lesions.     Pharynx: Uvula midline. Pharyngeal swelling and posterior oropharyngeal erythema present. No oropharyngeal exudate or uvula swelling.     Tonsils: No tonsillar exudate or tonsillar abscesses. Swelling: 0 on the right. 0 on the left.  Eyes:     General: Lids are normal. Allergic shiner present. No visual field deficit or scleral icterus.       Right eye: No foreign body, discharge or hordeolum.        Left eye: No foreign body, discharge or hordeolum.     Extraocular Movements: Extraocular movements intact.     Right eye: Normal extraocular motion and no nystagmus.     Left eye: Normal extraocular motion and no nystagmus.     Conjunctiva/sclera: Conjunctivae normal.     Right eye: Right conjunctiva is not injected. No chemosis, exudate or hemorrhage.    Left eye: Left conjunctiva is not injected. No chemosis, exudate or hemorrhage.    Pupils: Pupils are equal, round, and reactive to light. Pupils are equal.     Right eye: Pupil is round and reactive.     Left eye: Pupil is round and reactive.  Neck:     Musculoskeletal: Normal range of motion and neck supple. Normal range of motion. No edema, erythema, neck rigidity, spinous process tenderness or muscular tenderness.     Thyroid: No thyroid mass or thyromegaly.     Trachea: Trachea and phonation normal. No tracheal tenderness or tracheal deviation.  Cardiovascular:     Rate and Rhythm: Normal rate and regular rhythm.     Chest Wall: PMI is not displaced.     Heart sounds: Normal heart sounds, S1 normal  and S2 normal. No murmur. No friction rub. No gallop.   Pulmonary:     Effort: Pulmonary effort is normal. No accessory muscle usage or respiratory distress.     Breath sounds: Normal breath sounds and air entry. No stridor, decreased air movement or transmitted upper airway sounds. No decreased breath sounds, wheezing, rhonchi or rales.     Comments: No cough observed in exam room spoke full sentences without difficulty Chest:     Chest wall: No tenderness.  Abdominal:     General: There is no distension.     Palpations: Abdomen is soft.  Musculoskeletal: Normal range of motion.        General: No tenderness.     Right shoulder: Normal.     Left shoulder: Normal.     Right elbow: Normal.    Left elbow: Normal.     Right hip: Normal.     Left hip: Normal.     Right knee: Normal.     Left knee: Normal.     Cervical back: Normal.     Thoracic back: Normal.     Lumbar back: Normal.     Right hand: Normal.     Left hand: Normal.  Lymphadenopathy:     Head:  Right side of head: No submental, submandibular, tonsillar, preauricular, posterior auricular or occipital adenopathy.     Left side of head: No submental, submandibular, tonsillar, preauricular, posterior auricular or occipital adenopathy.     Cervical: No cervical adenopathy.     Right cervical: No superficial, deep or posterior cervical adenopathy.    Left cervical: No superficial, deep or posterior cervical adenopathy.  Skin:    General: Skin is warm and dry.     Capillary Refill: Capillary refill takes less than 2 seconds.     Coloration: Skin is not ashen, cyanotic, jaundiced, mottled, pale or sallow.     Findings: No abrasion, abscess, acne, bruising, burn, ecchymosis, erythema, signs of injury, laceration, lesion, petechiae or rash.     Nails: There is no clubbing.   Neurological:     Mental Status: She is alert and oriented to person, place, and time. She is not disoriented.     GCS: GCS eye subscore is 4. GCS  verbal subscore is 5. GCS motor subscore is 6.     Cranial Nerves: Cranial nerves are intact. No cranial nerve deficit, dysarthria or facial asymmetry.     Sensory: Sensation is intact. No sensory deficit.     Motor: Motor function is intact. No weakness, tremor, atrophy, abnormal muscle tone or seizure activity.     Coordination: Coordination is intact. Coordination normal.     Gait: Gait is intact. Gait normal.     Comments: Gait sure and steady in hallway; on/off exam table and in/out of chair without difficulty  Psychiatric:        Attention and Perception: Attention and perception normal.        Mood and Affect: Mood and affect normal.        Speech: Speech normal.        Behavior: Behavior normal. Behavior is cooperative.        Thought Content: Thought content normal.        Cognition and Memory: Cognition and memory normal.        Judgment: Judgment normal.           Assessment & Plan:  A-acute recurrent maxillary sinusitis subsequent visit, acute bronchitis  P-[id number] 94-97% ra while talking and taking deep breaths in exam room  I Lengthened prednisone taper 30x2d,20x2d,10x2d po with breakfast #21 RF0 dispensed from [facility] to patient and refilled tessalon pearles 200mg  po TID prn cough to her pharmacy of choice electronic Rx #30 RF0. Finish last dose Rx Doxycycline 100mg  po BID x 10 days #20 RF0 previouslydispensed from [facility] to patient. Discussed to use sunscreen/protective clothing as increased risk of sunburn and most common side effect stomach upset take medication with food. Cough lozenges po q2h prn cough  Contact ordering provider/PCM if she still would like refill on guiafenesen with hydrocodone cough syrup as I am unable to Rx due to contract limitations [facility].    Discussed prednisone possible side effects increased/decreased appetite, difficulty sleeping, increased blood sugar, increased blood pressure and heart rate.  Albuterol MDI 29mcg 1-2 puffs po q4-6h prn protracted  cough/wheeze possible side effects hand tremor/ increased heart rate. Follow up with RN Hildred Alamin if feeling respiratory symptoms worsening for repeat VS and RN assessment when I am not in clinic.  Bronchitis simple, community acquired, may have started as viral (probably respiratory syncytial, parainfluenza, influenza, or adenovirus), but now evidence of acute purulent bronchitis with resultant bronchial edema and mucus formation.  Viruses are the most common cause of bronchial inflammation  in otherwise healthy adults with acute bronchitis.  The appearance of sputum is not predictive of whether a bacterial infection is present.  Purulent sputum is most often caused by viral infections.  There are a small portion of those caused by non-viral agents being Mycoplama pneumonia.  Microscopic examination or C&S of sputum in the healthy adult with acute bronchitis is generally not helpful (usually negative or normal respiratory flora) other considerations being cough from upper respiratory tract infections, sinusitis or allergic syndromes (mild asthma or viral pneumonia).  Differential Diagnoses:  reactive airway disease (asthma, allergic aspergillosis (eosinophilia), chronic bronchitis, respiratory infection (sinusitis, common cold, pneumonia), congestive heart failure, reflux esophagitis, bronchogenic tumor, aspiration syndromes and/or exposure to pulmonary irritants/smoke.   Without high fever, severe dyspnea, lack of physical findings or other risk factors, I will hold on a chest radiograph and CBC at this time.  I discussed that approximately 50% of patients with acute bronchitis have a cough that lasts up to three weeks, and 25% for over a month.  Tylenol 500mg  one to two tablets every four to six hours as needed for fever or myalgias.  No aspirin. Exitcare handout on bronchitis and inhaler use   ER if hemopthysis, SOB, worst chest pain of life.   Patient instructed to follow up if no improvement  in one week or sooner  if symptoms worsen.  Patient verbalized agreement and understanding of treatment plan and had no further questions at this time.  P2:  hand washing and cover cough  continue flonase 1 spray each nostril BID, increase saline 2 sprays each nostril q2h wa prn congestion to at least QID.  Discussed with patient that weather/atmospheric pressure changes can worsen sinus pressure/congestion today. Finish doxycycline and restart extended prednisone taper.  May continue to use tylenol 1000mg  po QID prn pain.  Denied personal or family history of ENT cancer.  Shower BID especially prior to bed. No evidence of systemic bacterial infection, non toxic and well hydrated.  I do not see where any further testing or imaging is necessary at this time.   I will suggest supportive care, rest, good hygiene and encourage the patient to take adequate fluids.  The patient is to return to clinic or EMERGENCY ROOM if symptoms worsen or change significantly.  Exitcare handout on sinusitis and sinus rinse.  Patient verbalized agreement and understanding of treatment plan and had no further questions at this time.   P2:  Hand washing and cover cough  Discussed spring bloom has started and worsening seasonal allergy symptoms keep taking your allergy medication every day.  Patient may use normal saline nasal spray 2 sprays each nostril q2h wa as needed. flonase 23mcg 1 spray each nostril BID .  Patient denied personal or family history of ENT cancer.  OTC antihistamine of choice zyrtec 10mg  po daily.  Avoid triggers if possible.  Shower prior to bedtime if exposed to triggers.  If allergic dust/dust mites recommend mattress/pillow covers/encasements; washing linens, vacuuming, sweeping, dusting weekly.  Call or return to clinic as needed if these symptoms worsen or fail to improve as anticipated.   Exitcare handout on allergic rhinitis and sinus rinse.  Patient verbalized understanding of instructions, agreed with plan of care and had no  further questions at this time.  P2:  Avoidance and hand washing.

## 2018-04-03 NOTE — Patient Instructions (Signed)
How to Use a Metered Dose Inhaler A metered dose inhaler is a handheld device for taking medicine that must be breathed into the lungs (inhaled). The device can be used to deliver a variety of inhaled medicines, including:  Quick relief or rescue medicines, such as bronchodilators.  Controller medicines, such as corticosteroids. The medicine is delivered by pushing down on a metal canister to release a preset amount of spray and medicine. Each device contains the amount of medicine that is needed for a preset number of uses (inhalations). Your health care provider may recommend that you use a spacer with your inhaler to help you take the medicine more effectively. A spacer is a plastic tube with a mouthpiece on one end and an opening that connects to the inhaler on the other end. A spacer holds the medicine in a tube for a short time, which allows you to inhale more medicine. What are the risks? If you do not use your inhaler correctly, medicine might not reach your lungs to help you breathe. Inhaler medicine can cause side effects, such as:  Mouth or throat infection.  Cough.  Hoarseness.  Headache.  Nausea and vomiting.  Lung infection (pneumonia) in people who have a lung condition called COPD. How to use a metered dose inhaler without a spacer  1. Remove the cap from the inhaler. 2. If you are using the inhaler for the first time, shake it for 5 seconds, turn it away from your face, then release 4 puffs into the air. This is called priming. 3. Shake the inhaler for 5 seconds. 4. Position the inhaler so the top of the canister faces up. 5. Put your index finger on the top of the medicine canister. Support the bottom of the inhaler with your thumb. 6. Breathe out normally and as completely as possible, away from the inhaler. 7. Either place the inhaler between your teeth and close your lips tightly around the mouthpiece, or hold the inhaler 1-2 inches (2.5-5 cm) away from your open  mouth. Keep your tongue down out of the way. If you are unsure which technique to use, ask your health care provider. 8. Press the canister down with your index finger to release the medicine, then inhale deeply and slowly through your mouth (not your nose) until your lungs are completely filled. Inhaling should take 4-6 seconds. 9. Hold the medicine in your lungs for 5-10 seconds (10 seconds is best). This helps the medicine get into the small airways of your lungs. 10. With your lips in a tight circle (pursed), breathe out slowly. 11. Repeat steps 3-10 until you have taken the number of puffs that your health care provider directed. Wait about 1 minute between puffs or as directed. 12. Put the cap on the inhaler. 13. If you are using a steroid inhaler, rinse your mouth with water, gargle, and spit out the water. Do not swallow the water. How to use a metered dose inhaler with a spacer  1. Remove the cap from the inhaler. 2. If you are using the inhaler for the first time, shake it for 5 seconds, turn it away from your face, then release 4 puffs into the air. This is called priming. 3. Shake the inhaler for 5 seconds. 4. Place the open end of the spacer onto the inhaler mouthpiece. 5. Position the inhaler so the top of the canister faces up and the spacer mouthpiece faces you. 6. Put your index finger on the top of the medicine canister.   Support the bottom of the inhaler and the spacer with your thumb. 7. Breathe out normally and as completely as possible, away from the spacer. 8. Place the spacer between your teeth and close your lips tightly around it. Keep your tongue down out of the way. 9. Press the canister down with your index finger to release the medicine, then inhale deeply and slowly through your mouth (not your nose) until your lungs are completely filled. Inhaling should take 4-6 seconds. 10. Hold the medicine in your lungs for 5-10 seconds (10 seconds is best). This helps the  medicine get into the small airways of your lungs. 11. With your lips in a tight circle (pursed), breathe out slowly. 12. Repeat steps 3-11 until you have taken the number of puffs that your health care provider directed. Wait about 1 minute between puffs or as directed. 13. Remove the spacer from the inhaler and put the cap on the inhaler. 14. If you are using a steroid inhaler, rinse your mouth with water, gargle, and spit out the water. Do not swallow the water. Follow these instructions at home:  Take your inhaled medicine only as told by your health care provider. Do not use the inhaler more than directed by your health care provider.  Keep all follow-up visits as told by your health care provider. This is important.  If your inhaler has a counter, you can check it to determine how full your inhaler is. If your inhaler does not have a counter, ask your health care provider when you will need to refill your inhaler and write the refill date on a calendar or on your inhaler canister. Note that you cannot know when an inhaler is empty by shaking it.  Follow directions on the package insert for care and cleaning of your inhaler and spacer. Contact a health care provider if:  Symptoms are only partially relieved with your inhaler.  You are having trouble using your inhaler.  You have an increase in phlegm.  You have headaches. Get help right away if:  You feel little or no relief after using your inhaler.  You have dizziness.  You have a fast heart rate.  You have chills or a fever.  You have night sweats.  There is blood in your phlegm. Summary  A metered dose inhaler is a handheld device for taking medicine that must be breathed into the lungs (inhaled).  The medicine is delivered by pushing down on a metal canister to release a preset amount of spray and medicine.  Each device contains the amount of medicine that is needed for a preset number of uses (inhalations). This  information is not intended to replace advice given to you by your health care provider. Make sure you discuss any questions you have with your health care provider. Document Released: 02/12/2005 Document Revised: 09/03/2016 Document Reviewed: 01/03/2016 Elsevier Interactive Patient Education  2019 [employer] Acute Bronchitis, Adult  Acute bronchitis is sudden (acute) swelling of the air tubes (bronchi) in the lungs. Acute bronchitis causes these tubes to fill with mucus, which can make it hard to breathe. It can also cause coughing or wheezing. In adults, acute bronchitis usually goes away within 2 weeks. A cough caused by bronchitis may last up to 3 weeks. Smoking, allergies, and asthma can make the condition worse. Repeated episodes of bronchitis may cause further lung problems, such as chronic obstructive pulmonary disease (COPD). What are the causes? This condition can be caused by germs and by  substances that irritate the lungs, including:  Cold and flu viruses. This condition is most often caused by the same virus that causes a cold.  Bacteria.  Exposure to tobacco smoke, dust, fumes, and air pollution. What increases the risk? This condition is more likely to develop in people who:  Have close contact with someone with acute bronchitis.  Are exposed to lung irritants, such as tobacco smoke, dust, fumes, and vapors.  Have a weak immune system.  Have a respiratory condition such as asthma. What are the signs or symptoms? Symptoms of this condition include:  A cough.  Coughing up clear, yellow, or green mucus.  Wheezing.  Chest congestion.  Shortness of breath.  A fever.  Body aches.  Chills.  A sore throat. How is this diagnosed? This condition is usually diagnosed with a physical exam. During the exam, your health care provider may order tests, such as chest X-rays, to rule out other conditions. He or she may also:  Test a sample of your mucus for bacterial  infection.  Check the level of oxygen in your blood. This is done to check for pneumonia.  Do a chest X-ray or lung function testing to rule out pneumonia and other conditions.  Perform blood tests. Your health care provider will also ask about your symptoms and medical history. How is this treated? Most cases of acute bronchitis clear up over time without treatment. Your health care provider may recommend:  Drinking more fluids. Drinking more makes your mucus thinner, which may make it easier to breathe.  Taking a medicine for a fever or cough.  Taking an antibiotic medicine.  Using an inhaler to help improve shortness of breath and to control a cough.  Using a cool mist vaporizer or humidifier to make it easier to breathe. Follow these instructions at home: Medicines  Take over-the-counter and prescription medicines only as told by your health care provider.  If you were prescribed an antibiotic, take it as told by your health care provider. Do not stop taking the antibiotic even if you start to feel better. General instructions   Get plenty of rest.  Drink enough fluids to keep your urine pale yellow.  Avoid smoking and secondhand smoke. Exposure to cigarette smoke or irritating chemicals will make bronchitis worse. If you smoke and you need help quitting, ask your health care provider. Quitting smoking will help your lungs heal faster.  Use an inhaler, cool mist vaporizer, or humidifier as told by your health care provider.  Keep all follow-up visits as told by your health care provider. This is important. How is this prevented? To lower your risk of getting this condition again:  Wash your hands often with soap and water. If soap and water are not available, use hand sanitizer.  Avoid contact with people who have cold symptoms.  Try not to touch your hands to your mouth, nose, or eyes.  Make sure to get the flu shot every year. Contact a health care provider  if:  Your symptoms do not improve in 2 weeks of treatment. Get help right away if:  You cough up blood.  You have chest pain.  You have severe shortness of breath.  You become dehydrated.  You faint or keep feeling like you are going to faint.  You keep vomiting.  You have a severe headache.  Your fever or chills gets worse. This information is not intended to replace advice given to you by your health care provider. Make sure  you discuss any questions you have with your health care provider. Document Released: 03/22/2004 Document Revised: 09/26/2016 Document Reviewed: 08/03/2015 Elsevier Interactive Patient Education  2019 [employer] Sinusitis, Adult Sinusitis is inflammation of your sinuses. Sinuses are hollow spaces in the bones around your face. Your sinuses are located:  Around your eyes.  In the middle of your forehead.  Behind your nose.  In your cheekbones. Mucus normally drains out of your sinuses. When your nasal tissues become inflamed or swollen, mucus can become trapped or blocked. This allows bacteria, viruses, and fungi to grow, which leads to infection. Most infections of the sinuses are caused by a virus. Sinusitis can develop quickly. It can last for up to 4 weeks (acute) or for more than 12 weeks (chronic). Sinusitis often develops after a cold. What are the causes? This condition is caused by anything that creates swelling in the sinuses or stops mucus from draining. This includes:  Allergies.  Asthma.  Infection from bacteria or viruses.  Deformities or blockages in your nose or sinuses.  Abnormal growths in the nose (nasal polyps).  Pollutants, such as chemicals or irritants in the air.  Infection from fungi (rare). What increases the risk? You are more likely to develop this condition if you:  Have a weak body defense system (immune system).  Do a lot of swimming or diving.  Overuse nasal sprays.  Smoke. What are the signs or  symptoms? The main symptoms of this condition are pain and a feeling of pressure around the affected sinuses. Other symptoms include:  Stuffy nose or congestion.  Thick drainage from your nose.  Swelling and warmth over the affected sinuses.  Headache.  Upper toothache.  A cough that may get worse at night.  Extra mucus that collects in the throat or the back of the nose (postnasal drip).  Decreased sense of smell and taste.  Fatigue.  A fever.  Sore throat.  Bad breath. How is this diagnosed? This condition is diagnosed based on:  Your symptoms.  Your medical history.  A physical exam.  Tests to find out if your condition is acute or chronic. This may include: ? Checking your nose for nasal polyps. ? Viewing your sinuses using a device that has a light (endoscope). ? Testing for allergies or bacteria. ? Imaging tests, such as an MRI or CT scan. In rare cases, a bone biopsy may be done to rule out more serious types of fungal sinus disease. How is this treated? Treatment for sinusitis depends on the cause and whether your condition is chronic or acute.  If caused by a virus, your symptoms should go away on their own within 10 days. You may be given medicines to relieve symptoms. They include: ? Medicines that shrink swollen nasal passages (topical intranasal decongestants). ? Medicines that treat allergies (antihistamines). ? A spray that eases inflammation of the nostrils (topical intranasal corticosteroids). ? Rinses that help get rid of thick mucus in your nose (nasal saline washes).  If caused by bacteria, your health care provider may recommend waiting to see if your symptoms improve. Most bacterial infections will get better without antibiotic medicine. You may be given antibiotics if you have: ? A severe infection. ? A weak immune system.  If caused by narrow nasal passages or nasal polyps, you may need to have surgery. Follow these instructions at  home: Medicines  Take, use, or apply over-the-counter and prescription medicines only as told by your health care provider. These may include  nasal sprays.  If you were prescribed an antibiotic medicine, take it as told by your health care provider. Do not stop taking the antibiotic even if you start to feel better. Hydrate and humidify   Drink enough fluid to keep your urine pale yellow. Staying hydrated will help to thin your mucus.  Use a cool mist humidifier to keep the humidity level in your home above 50%.  Inhale steam for 10-15 minutes, 3-4 times a day, or as told by your health care provider. You can do this in the bathroom while a hot shower is running.  Limit your exposure to cool or dry air. Rest  Rest as much as possible.  Sleep with your head raised (elevated).  Make sure you get enough sleep each night. General instructions   Apply a warm, moist washcloth to your face 3-4 times a day or as told by your health care provider. This will help with discomfort.  Wash your hands often with soap and water to reduce your exposure to germs. If soap and water are not available, use hand sanitizer.  Do not smoke. Avoid being around people who are smoking (secondhand smoke).  Keep all follow-up visits as told by your health care provider. This is important. Contact a health care provider if:  You have a fever.  Your symptoms get worse.  Your symptoms do not improve within 10 days. Get help right away if:  You have a severe headache.  You have persistent vomiting.  You have severe pain or swelling around your face or eyes.  You have vision problems.  You develop confusion.  Your neck is stiff.  You have trouble breathing. Summary  Sinusitis is soreness and inflammation of your sinuses. Sinuses are hollow spaces in the bones around your face.  This condition is caused by nasal tissues that become inflamed or swollen. The swelling traps or blocks the flow of  mucus. This allows bacteria, viruses, and fungi to grow, which leads to infection.  If you were prescribed an antibiotic medicine, take it as told by your health care provider. Do not stop taking the antibiotic even if you start to feel better.  Keep all follow-up visits as told by your health care provider. This is important. This information is not intended to replace advice given to you by your health care provider. Make sure you discuss any questions you have with your health care provider. Document Released: 02/12/2005 Document Revised: 07/15/2017 Document Reviewed: 07/15/2017 Elsevier Interactive Patient Education  2019 [employer]. How to Perform a Sinus Rinse A sinus rinse is a home treatment that is used to rinse your sinuses with a sterile mixture of salt and water (saline solution). Sinuses are air-filled spaces in your skull behind the bones of your face and forehead that open into your nasal cavity. A sinus rinse can help to clear mucus, dirt, dust, or pollen from your nasal cavity. You may do a sinus rinse when you have a cold, a virus, nasal allergy symptoms, a sinus infection, or stuffiness in your nose or sinuses. Talk with your health care provider about whether a sinus rinse might help you. What are the risks? A sinus rinse is generally safe and effective. However, there are a few risks, which include:  A burning sensation in your sinuses. This may happen if you do not make the saline solution as directed. Be sure to follow all directions when making the saline solution.  Nasal irritation.  Infection from contaminated water. This  is rare, but possible. Do not do a sinus rinse if you have had ear or nasal surgery, ear infection, or blocked ears. Supplies needed:  Saline solution or powder.  Distilled or sterile water may be needed to mix with saline powder. ? You may use boiled and cooled tap water. Boil tap water for 5 minutes; cool until it is lukewarm. Use within 24  hours. ? Do not use regular tap water to mix with the saline solution.  Neti pot or nasal rinse bottle. These supplies release the saline solution into your nose and through your sinuses. Neti pots and nasal rinse bottles can be purchased at [employer], a health food store, or online. How to perform a sinus rinse  14. Wash your hands with soap and water. 34. Wash your device according to the directions that came with the product and then dry it. 16. Use the solution that comes with your product or one that is sold separately in stores. Follow the mixing directions on the package if you need to mix with sterile or distilled water. 17. Fill the device with the amount of saline solution noted in the device instructions. 18. Stand over a sink and tilt your head sideways over the sink. 19. Place the spout of the device in your upper nostril (the one closer to the ceiling). 20. Gently pour or squeeze the saline solution into your nasal cavity. The liquid should drain out from the lower nostril if you are not too congested. 21. While rinsing, breathe through your open mouth. 22. Gently blow your nose to clear any mucus and rinse solution. Blowing too hard may cause ear pain. 23. Repeat in your other nostril. 24. Clean and rinse your device with clean water and then air-dry it. Talk with your health care provider or pharmacist if you have questions about how to do a sinus rinse. Summary  A sinus rinse is a home treatment that is used to rinse your sinuses with a sterile mixture of salt and water (saline solution).  A sinus rinse is generally safe and effective. Follow all instructions carefully.  Before doing a sinus rinse, talk with your health care provider about whether it would be helpful for you. This information is not intended to replace advice given to you by your health care provider. Make sure you discuss any questions you have with your health care provider. Document Released:  09/09/2013 Document Revised: 12/10/2016 Document Reviewed: 12/10/2016 Elsevier Interactive Patient Education  2019 [employer].

## 2018-04-11 ENCOUNTER — Other Ambulatory Visit: Payer: Self-pay | Admitting: *Deleted

## 2018-04-11 MED ORDER — ALBUTEROL SULFATE HFA 108 (90 BASE) MCG/ACT IN AERS: 1.0000 | INHALATION_SPRAY | RESPIRATORY_TRACT | 1 refills | Status: DC | PRN

## 2018-04-11 NOTE — Telephone Encounter (Signed)
Pt reports her albuterol inhaler is empty. Has been using it for past week for bronchitis. Requesting refill. Preferred pharmacy confirmed.

## 2018-04-14 ENCOUNTER — Encounter: Payer: Self-pay | Admitting: General Practice

## 2018-04-14 ENCOUNTER — Other Ambulatory Visit: Payer: Self-pay

## 2018-04-14 ENCOUNTER — Encounter: Payer: Self-pay | Admitting: Family Medicine

## 2018-04-14 ENCOUNTER — Ambulatory Visit: Payer: PRIVATE HEALTH INSURANCE | Admitting: Family Medicine

## 2018-04-14 VITALS — BP 118/78 | HR 91 | Temp 99.6°F | Resp 16 | Ht 61.0 in | Wt 198.0 lb

## 2018-04-14 DIAGNOSIS — J329 Chronic sinusitis, unspecified: Secondary | ICD-10-CM | POA: Diagnosis not present

## 2018-04-14 DIAGNOSIS — B9689 Other specified bacterial agents as the cause of diseases classified elsewhere: Secondary | ICD-10-CM | POA: Diagnosis not present

## 2018-04-14 DIAGNOSIS — R059 Cough, unspecified: Secondary | ICD-10-CM

## 2018-04-14 DIAGNOSIS — R05 Cough: Secondary | ICD-10-CM | POA: Diagnosis not present

## 2018-04-14 MED ORDER — ALBUTEROL SULFATE (2.5 MG/3ML) 0.083% IN NEBU
2.5000 mg | INHALATION_SOLUTION | Freq: Once | RESPIRATORY_TRACT | Status: AC
  Administered 2018-04-14: 2.5 mg via RESPIRATORY_TRACT

## 2018-04-14 MED ORDER — AMOXICILLIN 875 MG PO TABS: 875.0000 mg | ORAL_TABLET | Freq: Two times a day (BID) | ORAL | 0 refills | Status: DC

## 2018-04-14 NOTE — Patient Instructions (Signed)
Schedule an appt late April/early May to recheck BP START the Amoxicillin twice daily- take w/ food Drink plenty of fluids REST! Take your allergy pill every day! Use the nasal spray- 2 sprays each nostril daily Use the Albuterol inhaler as needed for chest tightness/wheezing/cough/shortness of breath Use Mucinex DM to help clear congestion and relieve cough If no improvement, let me know and we can order a chest xray Call with any questions or concerns Hang in there!!

## 2018-04-14 NOTE — Progress Notes (Signed)
   Subjective:    Patient ID: Jamie Kelley, female    DOB: 12-Apr-1955, 63 y.o.   MRN: [id number]  HPI Cough- this is pt's 4th visit for same complaints.  She was seen 1/17 and dx'd w/ ILI and prescribed Cheratussin.  Was seen on 1/28 and dx'd w/ sinusitis and bronchitis- prescribed Doxy, prednisone, and albuterol.  Seen 2/6 and prednisone taper was extended.  Pt reports cough will improve w/ treatments but 'then it starts up again'.  No fevers.  + sinus pressure.  No ear pain.  No tooth pain.  + post tussive emesis.  Intermittent sore throat.  Denies nasal drainage.  Currently using daily antihistamine and nasal steroid.  Some wheezing.   Review of Systems For ROS see HPI     Objective:   Physical Exam Vitals signs reviewed.  Constitutional:      General: She is not in acute distress.    Appearance: She is well-developed.  HENT:     Head: Normocephalic and atraumatic.     Right Ear: Tympanic membrane normal.     Left Ear: Tympanic membrane normal.     Nose: Mucosal edema and rhinorrhea present.     Right Sinus: Maxillary sinus tenderness and frontal sinus tenderness present.     Left Sinus: Maxillary sinus tenderness and frontal sinus tenderness present.     Mouth/Throat:     Pharynx: Uvula midline. Posterior oropharyngeal erythema present. No oropharyngeal exudate.  Eyes:     Conjunctiva/sclera: Conjunctivae normal.     Pupils: Pupils are equal, round, and reactive to light.  Neck:     Musculoskeletal: Normal range of motion and neck supple.  Cardiovascular:     Rate and Rhythm: Normal rate and regular rhythm.     Heart sounds: Normal heart sounds.  Pulmonary:     Effort: Pulmonary effort is normal. No respiratory distress.     Breath sounds: Normal breath sounds. No wheezing.     Comments: Decreased breath sounds, improved s/p neb tx Lymphadenopathy:     Cervical: No cervical adenopathy.           Assessment & Plan:  Bacterial sinusitis- new.  This is pt's 4th visit for  similar sxs.  She has exquisite TTP over frontal and maxillary sinuses.  Start Amox as pt has already been on Doxy.  Cough improved s/p neb tx in office.  She is to continue albuterol prn.  Lungs CTA today but if cough doesn't improve or pt starts to feel better, will need CXR.  Pt expressed understanding and is in agreement w/ plan.

## 2018-05-22 ENCOUNTER — Telehealth: Payer: Self-pay | Admitting: Family Medicine

## 2018-05-22 MED ORDER — SIMVASTATIN 20 MG PO TABS: 20.0000 mg | ORAL_TABLET | Freq: Every day | ORAL | 1 refills | Status: DC

## 2018-05-22 MED ORDER — AMLODIPINE BESYLATE 10 MG PO TABS: 10.0000 mg | ORAL_TABLET | Freq: Every day | ORAL | 1 refills | Status: DC

## 2018-05-22 MED ORDER — ATENOLOL 50 MG PO TABS: 100.0000 mg | ORAL_TABLET | Freq: Every day | ORAL | 1 refills | Status: DC

## 2018-05-22 NOTE — Telephone Encounter (Signed)
Pt states that she needs a refill on simvastatin, amlodipine, atenolol and would like them faxed to her work due to being able to have them filled there. Fax # [phone]

## 2018-05-22 NOTE — Telephone Encounter (Signed)
Medications have been printed and faxed per patient request.

## 2018-05-22 NOTE — Addendum Note (Signed)
Addended by: Katina Dung on: 05/22/2018 11:40 AM   Modules accepted: Orders

## 2018-06-13 ENCOUNTER — Other Ambulatory Visit: Payer: Self-pay | Admitting: Family Medicine

## 2018-07-25 ENCOUNTER — Other Ambulatory Visit: Payer: Self-pay | Admitting: Family Medicine

## 2018-09-24 ENCOUNTER — Other Ambulatory Visit: Payer: Self-pay | Admitting: Family Medicine

## 2018-10-21 ENCOUNTER — Ambulatory Visit: Payer: Self-pay | Admitting: Registered Nurse

## 2018-10-21 ENCOUNTER — Other Ambulatory Visit: Payer: Self-pay

## 2018-10-21 ENCOUNTER — Encounter: Payer: Self-pay | Admitting: Registered Nurse

## 2018-10-21 VITALS — BP 143/98 | HR 97 | Temp 97.2°F

## 2018-10-21 DIAGNOSIS — S29012A Strain of muscle and tendon of back wall of thorax, initial encounter: Secondary | ICD-10-CM

## 2018-10-21 DIAGNOSIS — Z639 Problem related to primary support group, unspecified: Secondary | ICD-10-CM

## 2018-10-21 MED ORDER — ACETAMINOPHEN 500 MG PO TABS: 1000.0000 mg | ORAL_TABLET | Freq: Four times a day (QID) | ORAL | 0 refills | Status: AC | PRN

## 2018-10-21 MED ORDER — CYCLOBENZAPRINE HCL 10 MG PO TABS: 5.0000 mg | ORAL_TABLET | Freq: Every evening | ORAL | 0 refills | Status: AC | PRN

## 2018-10-21 MED ORDER — BIOFREEZE 4 % EX GEL: 1.0000 "application " | Freq: Four times a day (QID) | CUTANEOUS | Status: AC | PRN

## 2018-10-21 MED ORDER — IBUPROFEN 800 MG PO TABS: 800.0000 mg | ORAL_TABLET | Freq: Two times a day (BID) | ORAL | 0 refills | Status: AC | PRN

## 2018-10-21 NOTE — Patient Instructions (Signed)
Muscle Cramps and Spasms Muscle cramps and spasms occur when a muscle or muscles tighten and you have no control over this tightening (involuntary muscle contraction). They are a common problem and can develop in any muscle. The most common place is in the calf muscles of the leg. Muscle cramps and muscle spasms are both involuntary muscle contractions, but there are some differences between the two: Muscle cramps are painful. They come and go and may last for a few seconds or up to 15 minutes. Muscle cramps are often more forceful and last longer than muscle spasms. Muscle spasms may or may not be painful. They may also last just a few seconds or much longer. Certain medical conditions, such as diabetes or Parkinson's disease, can make it more likely to develop cramps or spasms. However, cramps or spasms are usually not caused by a serious underlying problem. Common causes include: Doing more physical work or exercise than your body is ready for (overexertion). Overuse from repeating certain movements too many times. Remaining in a certain position for a long period of time. Improper preparation, form, or technique while playing a sport or doing an activity. Dehydration. Injury. Side effects of some medicines. Abnormally low levels of the salts and minerals in your blood (electrolytes), especially potassium and calcium. This could happen if you are taking water pills (diuretics) or if you are pregnant. In many cases, the cause of muscle cramps or spasms is not known. Follow these instructions at home: Managing pain and stiffness     Try massaging, stretching, and relaxing the affected muscle. Do this for several minutes at a time. If directed, apply heat to tight or tense muscles as often as told by your health care provider. Use the heat source that your health care provider recommends, such as a moist heat pack or a heating pad. Place a towel between your skin and the heat source. Leave the  heat on for 20-30 minutes. Remove the heat if your skin turns bright red. This is especially important if you are unable to feel pain, heat, or cold. You may have a greater risk of getting burned. If directed, put ice on the affected area. This may help if you are sore or have pain after a cramp or spasm. Put ice in a plastic bag. Place a towel between your skin and the bag. Leavethe ice on for 20 minutes, 2-3 times a day. Try taking hot showers or baths to help relax tight muscles. Eating and drinking Drink enough fluid to keep your urine pale yellow. Staying well hydrated may help prevent cramps or spasms. Eat a healthy diet that includes plenty of nutrients to help your muscles function. A healthy diet includes fruits and vegetables, lean protein, whole grains, and low-fat or nonfat dairy products. General instructions If you are having frequent cramps, avoid intense exercise for several days. Take over-the-counter and prescription medicines only as told by your health care provider. Pay attention to any changes in your symptoms. Keep all follow-up visits as told by your health care provider. This is important. Contact a health care provider if: Your cramps or spasms get more severe or happen more often. Your cramps or spasms do not improve over time. Summary Muscle cramps and spasms occur when a muscle or muscles tighten and you have no control over this tightening (involuntary muscle contraction). The most common place for cramps or spasms to occur is in the calf muscles of the leg. Massaging, stretching, and relaxing the affected muscle  may relieve the cramp or spasm. Drink enough fluid to keep your urine pale yellow. Staying well hydrated may help prevent cramps or spasms. This information is not intended to replace advice given to you by your health care provider. Make sure you discuss any questions you have with your health care provider. Document Released: 08/04/2001 Document  Revised: 07/08/2017 Document Reviewed: 07/08/2017 Elsevier Patient Education  2020 [place]. Shoulder Sprain  A shoulder sprain is a partial or complete tear in one of the tough, fiber-like tissues (ligaments) in the shoulder. The ligaments in the shoulder help to hold the shoulder in place. What are the causes? This condition may be caused by:  A fall.  A hit to the shoulder.  A twist of the arm. What increases the risk? You are more likely to develop this condition if you:  Play sports.  Have problems with balance or coordination. What are the signs or symptoms? Symptoms of this condition include:  Pain when moving the shoulder.  Limited ability to move the shoulder.  Swelling and tenderness on top of the shoulder.  Warmth in the shoulder.  A change in the shape of the shoulder.  Redness or bruising on the shoulder. How is this diagnosed? This condition is diagnosed with:  A physical exam. During the exam, you may be asked to do simple exercises with your shoulder.  Imaging tests such as X-rays, MRI, or a CT scan. These tests can show how severe the sprain is. How is this treated? This condition may be treated with:  Rest.  Pain medicine.  Ice.  A sling or brace. This is used to keep the arm still while the shoulder is healing.  Physical therapy or rehabilitation exercises. These help to improve the range of motion and strength of the shoulder.  Surgery (rare). Surgery may be needed if the sprain caused a joint to become unstable. Surgery may also be needed to reduce pain. Some people may develop ongoing shoulder pain or lose some range of motion in the shoulder. However, most people do not develop long-term problems. Follow these instructions at home:  If you have a sling or brace:  Wear the sling or brace as told by your health care provider. Remove it only as told by your health care provider.  Loosen the sling or brace if your fingers tingle,  become numb, or turn cold and blue.  Keep the sling or brace clean.  If the sling or brace is not waterproof: ? Do not let it get wet. ? Cover it with a watertight covering when you take a bath or shower. Activity  Rest your shoulder.  Move your arm only as much as told by your health care provider, but move your hand and fingers often to prevent stiffness and swelling.  Return to your normal activities as told by your health care provider. Ask your health care provider what activities are safe for you.  Ask your health care provider when it is safe for you to drive if you have a sling or brace on your shoulder.  If you were shown how to do any exercises, do them as told by your health care provider. General instructions  If directed, put ice on the affected area. ? Put ice in a plastic bag. ? Place a towel between your skin and the bag. ? Leave the ice on for 20 minutes, 2-3 times a day.  Take over-the-counter and prescription medicines only as told by your health care provider.  Do not use any products that contain nicotine or tobacco, such as cigarettes, e-cigarettes, and chewing tobacco. These can delay healing. If you need help quitting, ask your health care provider.  Keep all follow-up visits as told by your health care provider. This is important. Contact a health care provider if:  Your pain gets worse.  Your pain is not relieved with medicines.  You have increased redness or swelling. Get help right away if:  You have a fever.  You cannot move your arm or shoulder.  You develop severe numbness or tingling in your arm, hand, or fingers.  Your arm, hand, or fingers feel cold and turn blue, white, or gray. Summary  A shoulder sprain is a partial or complete tear in one of the tough, fiber-like tissues (ligaments) in the shoulder.  This condition may be caused by a fall, a hit to the shoulder, or a twist of the arm.  Treatment usually includes rest, ice, and  pain medicine as needed.  If you have a sling or brace, wear it as told by your health care provider. Remove it only as told by your health care provider. This information is not intended to replace advice given to you by your health care provider. Make sure you discuss any questions you have with your health care provider. Document Released: 07/01/2008 Document Revised: 07/19/2017 Document Reviewed: 07/19/2017 Elsevier Patient Education  2020 [place]. Thoracic Strain A thoracic strain, which is sometimes called a mid-back strain, is an injury to the muscles or tendons that attach to the upper part of your back behind your chest. This type of injury occurs when a muscle is overstretched or overloaded. Thoracic strains can range from mild to severe. Mild strains may involve stretching a muscle or tendon without tearing it. These injuries may heal in 1-2 weeks. More severe strains involve tearing of muscle fibers or tendons. These will cause more pain and may take 6-8 weeks to heal. What are the causes? This condition may be caused by:  Trauma, such as a fall or a hit to the body.  Twisting or overstretching the back. This may result from doing activities that require a lot of energy, such as lifting heavy objects. In some cases, the cause may not be known. What increases the risk? This injury is more common in:  Athletes.  People with obesity. What are the signs or symptoms? The main symptom of this condition is pain in the middle back, especially with movement. Other symptoms include:  Stiffness or limited range of motion.  Sudden muscle tightening (spasms). How is this diagnosed? This condition may be diagnosed based on:  Your symptoms.  Your medical history.  A physical exam.  Imaging tests, such as X-rays or an MRI. How is this treated? This condition may be treated with:  Resting the injured area.  Applying heat and cold to the injured area.  Over-the-counter  medicines for pain and inflammation, such as NSAIDs.  Prescription pain medicine or muscle relaxants may be needed for a short time.  Physical therapy. This will involve doing stretching and strengthening exercises. Follow these instructions at home: Managing pain, stiffness, and swelling      If directed, put ice on the injured area. ? Put ice in a plastic bag. ? Place a towel between your skin and the bag. ? Leave the ice on for 20 minutes, 2-3 times a day.  If directed, apply heat to the affected area as often as told by your  health care provider. Use the heat source that your health care provider recommends, such as a moist heat pack or a heating pad. ? Place a towel between your skin and the heat source. ? Leave the heat on for 20-30 minutes. ? Remove the heat if your skin turns bright red. This is especially important if you are unable to feel pain, heat, or cold. You may have a greater risk of getting burned. Activity  Rest and return to your normal activities as told by your health care provider. Ask your health care provider what activities are safe for you.  Do exercises as told by your health care provider. Medicines  Take over-the-counter and prescription medicines only as told by your health care provider.  Ask your health care provider if the medicine prescribed to you: ? Requires you to avoid driving or using heavy machinery. ? Can cause constipation. You may need to take these actions to prevent or treat constipation:  Drink enough fluid to keep your urine pale yellow.  Take over-the-counter or prescription medicines.  Eat foods that are high in fiber, such as beans, whole grains, and fresh fruits and vegetables.  Limit foods that are high in fat and processed sugars, such as fried or sweet foods. Injury prevention To prevent a future mid-back injury:  Always warm up properly before physical activity or sports.  Cool down and stretch after being active.   Use correct form when playing sports and lifting heavy objects. Bend your knees before you lift heavy objects.  Use good posture when sitting and standing.  Stay physically fit and maintain a healthy weight. ? Do at least 150 minutes of moderate-intensity exercise each week, such as brisk walking or water aerobics. ? Do strength exercises at least 2 times each week.  General instructions  Do not use any products that contain nicotine or tobacco, such as cigarettes, e-cigarettes, and chewing tobacco. If you need help quitting, ask your health care provider.  Keep all follow-up visits as told by your health care provider. This is important. Contact a health care provider if:  Your pain is not helped by medicine.  Your pain or stiffness is getting worse.  You develop pain or stiffness in your neck or lower back. Get help right away if you:  Have shortness of breath.  Have chest pain.  Develop numbness or weakness in your legs or arms.  Have involuntary loss of urine (urinary incontinence). Summary  A thoracic strain, which is sometimes called a mid-back strain, is an injury to the muscles or tendons that attach to the upper part of your back behind your chest.  This type of injury occurs when a muscle is overstretched or overloaded.  Rest and return to your normal activities as told by your health care provider. If directed, apply heat or ice to the affected area as often as told by your health care provider.  Take over-the-counter and prescription medicines only as told by your health care provider.  Contact a health care provider if you have new or worsening symptoms. This information is not intended to replace advice given to you by your health care provider. Make sure you discuss any questions you have with your health care provider. Document Released: 05/05/2003 Document Revised: 12/31/2017 Document Reviewed: 12/31/2017 Elsevier Patient Education  2020 [employer].  Thoracic Strain Rehab Ask your health care provider which exercises are safe for you. Do exercises exactly as told by your health care provider and adjust them as  directed. It is normal to feel mild stretching, pulling, tightness, or discomfort as you do these exercises. Stop right away if you feel sudden pain or your pain gets worse. Do not begin these exercises until told by your health care provider. Stretching and range-of-motion exercise This exercise warms up your muscles and joints and improves the movement and flexibility of your back and shoulders. This exercise also helps to relieve pain. Chest and spine stretch  1. Lie down on your back on a firm surface. 2. Roll a towel or a small blanket so it is about 4 inches (10 cm) in diameter. 3. Put the towel lengthwise under the middle of your back so it is under your spine, but not under your shoulder blades. 4. Put your hands behind your head and let your elbows fall to your sides. This will increase your stretch. 5. Take a deep breath (inhale). 6. Hold for ____15______ seconds. 7. Relax after you breathe out (exhale). Repeat ____3______ times. Complete this exercise ____3______ times a day. Strengthening exercises These exercises build strength and endurance in your back and your shoulder blade muscles. Endurance is the ability to use your muscles for a long time, even after they get tired. Alternating arm and leg raises  1. Get on your hands and knees on a firm surface. If you are on a hard floor, you may want to use padding, such as an exercise mat, to cushion your knees. 2. Line up your arms and legs. Your hands should be directly below your shoulders, and your knees should be directly below your hips. 3. Lift your left leg behind you. At the same time, raise your right arm and straighten it in front of you. ? Do not lift your leg higher than your hip. ? Do not lift your arm higher than your shoulder. ? Keep your abdominal and back  muscles tight. ? Keep your hips facing the ground. ? Do not arch your back. ? Keep your balance carefully, and do not hold your breath. 4. Hold for ______15____ seconds. 5. Slowly return to the starting position and repeat with your right leg and your left arm. Repeat _____3_____ times. Complete this exercise ___3_______ times a day. Straight arm rows This exercise is also called shoulder extension exercise. 1. Stand with your feet shoulder width apart. 2. Secure an exercise band to a stable object in front of you so the band is at or above shoulder height. 3. Hold one end of the exercise band in each hand. 4. Straighten your elbows and lift your hands up to shoulder height. 5. Step back, away from the secured end of the exercise band, until the band stretches. 6. Squeeze your shoulder blades together and pull your hands down to the sides of your thighs. Stop when your hands are straight down by your sides. This is shoulder extension. Do not let your hands go behind your body. 7. Hold for ____15______ seconds. 8. Slowly return to the starting position. Repeat ______3____ times. Complete this exercise ____3______ times a day. Prone shoulder external rotation 1. Lie on your abdomen on a firm bed so your left / right forearm hangs over the edge of the bed and your upper arm is on the bed, straight out from your body. This is the prone position. ? Your elbow should be bent. ? Your palm should be facing your feet. 2. If instructed, hold a _____none_____ weight in your hand. 3. Squeeze your shoulder blade toward the middle of your back.  Do not let your shoulder lift toward your ear. 4. Keep your elbow bent in a 90-degree angle (right angle) while you slowly move your forearm up toward the ceiling. Move your forearm up to the height of the bed, toward your head. This is external rotation. ? Your upper arm should not move. ? At the top of the movement, your palm should face the floor. 5. Hold for  ____15______ seconds. 6. Slowly return to the starting position and relax your muscles. Repeat _____3_____ times. Complete this exercise ___3_______ times a day. Rowing scapular retraction This is an exercise in which the shoulder blades (scapulae) are pulled toward each other (retraction). 1. Sit in a stable chair without armrests, or stand up. 2. Secure an exercise band to a stable object in front of you so the band is at shoulder height. 3. Hold one end of the exercise band in each hand. Your palms should face down. 4. Bring your arms out straight in front of you. 5. Step back, away from the secured end of the exercise band, until the band stretches. 6. Pull the band backward. As you do this, bend your elbows and squeeze your shoulder blades together, but avoid letting the rest of your body move. Do not shrug your shoulders upward while you do this. 7. Stop when your elbows are at your sides or slightly behind your body. 8. Hold for ____15______ seconds. 9. Slowly straighten your arms to return to the starting position. Repeat ____3______ times. Complete this exercise ___3_______ times a day. Posture and body mechanics Good posture and healthy body mechanics can help to relieve stress in your body's tissues and joints. Body mechanics refers to the movements and positions of your body while you do your daily activities. Posture is part of body mechanics. Good posture means:  Your spine is in its natural S-curve position (neutral).  Your shoulders are pulled back slightly.  Your head is not tipped forward. Follow these guidelines to improve your posture and body mechanics in your everyday activities. Standing   When standing, keep your spine neutral and your feet about hip width apart. Keep a slight bend in your knees. Your ears, shoulders, and hips should line up with each other.  When you do a task in which you lean forward while standing in one place for a long time, place one foot  up on a stable object that is 2-4 inches (5-10 cm) high, such as a footstool. This helps keep your spine neutral. Sitting   When sitting, keep your spine neutral and keep your feet flat on the floor. Use a footrest, if necessary, and keep your thighs parallel to the floor. Avoid rounding your shoulders, and avoid tilting your head forward.  When working at a desk or a computer, keep your desk at a height where your hands are slightly lower than your elbows. Slide your chair under your desk so you are close enough to maintain good posture.  When working at a computer, place your monitor at a height where you are looking straight ahead and you do not have to tilt your head forward or downward to look at the screen. Resting When lying down and resting, avoid positions that are most painful for you.  If you have pain with activities such as sitting, bending, stooping, or squatting (flexion-basedactivities), lie in a position in which your body does not bend very much. For example, avoid curling up on your side with your arms and knees near your chest (  fetal position).  If you have pain with activities such as standing for a long time or reaching with your arms (extension-basedactivities), lie with your spine in a neutral position and bend your knees slightly. Try the following positions: ? Lie on your side with a pillow between your knees. ? Lie on your back with a pillow under your knees.  Lifting   When lifting objects, keep your feet at least shoulder width apart and tighten your abdominal muscles.  Bend your knees and hips and keep your spine neutral. It is important to lift using the strength of your legs, not your back. Do not lock your knees straight out.  Always ask for help to lift heavy or awkward objects. This information is not intended to replace advice given to you by your health care provider. Make sure you discuss any questions you have with your health care provider. Document  Released: 02/12/2005 Document Revised: 06/06/2018 Document Reviewed: 03/24/2018 Elsevier Patient Education  2020 [employer].

## 2018-10-21 NOTE — Progress Notes (Signed)
Subjective:    Patient ID: Jamie Kelley, female    DOB: 01-Jan-1956, 63 y.o.   MRN: [id number]  63y/o established African-American female pt c/o R thoracic pain x1 week. Sts she felt a pull while lifting cases of water at a store last week, then the pain has gradually gotten worse. Rates it a 5/10 today. Worse with movements like twisting upper body or lying down. Using ibuprofen and leftover Flexeril at home and a heating pad with relief but pain returns shortly after meds wear off.   Thermacare and muscle rubbed helped more yesterday would like to try biofreeze.  Pain worst after lying down.  Denied loss of bowel/bladder control, saddle paresthesias, or arm/leg weakness/falls.  Father sick at home requiring full ADL assist trying to get him into a nursing home increased stress as working full time and his care giver.  Researching [facility] care options[facility]benefits for him also.  Has not contacted [facility].  Mother died 4 months ago.  Yesterday patient was dizzy at home denied loss of consciousness.  Friend was over and assisted her on stairs.  Patient wearing mask due to covid 19 pandemic at work, out in community.  Stated she has been trying to push fluids as worried back pain related to her kidneys.  Denied dysuria, hematuria, anuria, changes in color, cloudiness or smell.     Review of Systems  Constitutional: Positive for activity change and fatigue. Negative for appetite change, chills, diaphoresis, fever and unexpected weight change.  HENT: Negative for trouble swallowing and voice change.   Eyes: Negative for photophobia and visual disturbance.  Respiratory: Negative for cough, shortness of breath, wheezing and stridor.   Cardiovascular: Negative for chest pain.  Gastrointestinal: Negative for abdominal pain, nausea and vomiting.  Endocrine: Negative for cold intolerance and heat intolerance.  Genitourinary: Negative for difficulty urinating and enuresis.  Musculoskeletal: Positive for back pain  and myalgias. Negative for gait problem, joint swelling, neck pain and neck stiffness.  Allergic/Immunologic: Positive for environmental allergies. Negative for food allergies.  Neurological: Positive for dizziness. Negative for tremors, seizures, syncope, facial asymmetry, speech difficulty, weakness, light-headedness, numbness and headaches.  Hematological: Negative for adenopathy. Does not bruise/bleed easily.  Psychiatric/Behavioral: Negative for agitation, confusion and sleep disturbance.       Objective:   Physical Exam Vitals signs and nursing note reviewed.  Constitutional:      General: She is awake. She is not in acute distress.    Appearance: Normal appearance. She is well-developed and well-groomed. She is obese. She is not ill-appearing, toxic-appearing or diaphoretic.  HENT:     Head: Normocephalic and atraumatic.     Jaw: There is normal jaw occlusion.     Right Ear: Hearing and external ear normal.     Left Ear: Hearing and external ear normal.     Nose: Nose normal. No congestion or rhinorrhea.     Right Sinus: No maxillary sinus tenderness or frontal sinus tenderness.     Left Sinus: No maxillary sinus tenderness or frontal sinus tenderness.     Mouth/Throat:     Lips: Pink. No lesions.     Mouth: Mucous membranes are moist.     Pharynx: Oropharynx is clear. Uvula midline. No oropharyngeal exudate or posterior oropharyngeal erythema.  Eyes:     General: Lids are normal. Vision grossly intact. Gaze aligned appropriately. No visual field deficit or scleral icterus.       Right eye: No discharge.  Left eye: No discharge.     Extraocular Movements: Extraocular movements intact.     Conjunctiva/sclera: Conjunctivae normal.     Pupils: Pupils are equal, round, and reactive to light.  Neck:     Musculoskeletal: Normal range of motion and neck supple. Normal range of motion. No edema, erythema, neck rigidity, crepitus, injury, pain with movement, torticollis, spinous  process tenderness or muscular tenderness.     Thyroid: No thyromegaly.     Vascular: No JVD.     Trachea: Trachea and phonation normal. No tracheal deviation.  Cardiovascular:     Rate and Rhythm: Normal rate and regular rhythm.     Pulses: Normal pulses.          Radial pulses are 2+ on the right side and 2+ on the left side.     Heart sounds: Normal heart sounds. No murmur.  Pulmonary:     Effort: Pulmonary effort is normal. No respiratory distress.     Breath sounds: Normal breath sounds and air entry. No stridor, decreased air movement or transmitted upper airway sounds. No decreased breath sounds, wheezing, rhonchi or rales.     Comments: No cough observed in exam room; spoke full sentences without difficulty; wearing cloth mask due to covid 19 pandemic Chest:     Chest wall: No tenderness.  Abdominal:     General: Abdomen is flat.     Palpations: Abdomen is soft.     Tenderness: There is no abdominal tenderness. There is no right CVA tenderness or left CVA tenderness.  Musculoskeletal:        General: Tenderness and signs of injury present. No swelling or deformity.     Right shoulder: Normal.     Left shoulder: Normal.     Right elbow: Normal.    Left elbow: Normal.     Right hip: Normal.     Left hip: Normal. She exhibits normal range of motion, normal strength, no tenderness, no bony tenderness, no swelling, no crepitus, no deformity and no laceration.     Right knee: Normal.     Left knee: Normal.     Right ankle: Normal.     Left ankle: Normal.     Cervical back: Normal.     Thoracic back: She exhibits decreased range of motion, tenderness, pain and spasm. She exhibits no bony tenderness, no swelling, no edema, no deformity, no laceration and normal pulse.     Lumbar back: Normal. She exhibits normal range of motion, no tenderness, no bony tenderness, no swelling, no edema, no deformity, no laceration, no pain, no spasm and normal pulse.       Back:     Right hand:  Normal.     Left hand: Normal.     Right upper leg: Normal.     Left upper leg: Normal. She exhibits no tenderness, no bony tenderness, no swelling, no edema, no deformity and no laceration.     Right lower leg: Normal. No edema.     Left lower leg: Normal. No edema.     Comments: Right Latissmus dorsi mid and distal and scapula soft tissue TTP; pain with raising hand above 45 degrees; cross body; left greater than right rotation; right lateral bending greater than left; forward flexion able to touch toes; on/off exam table without difficulty; gait sure and steady in hallway; normal heel toe; mild muscle spasm noted right paraspinals and latissmus dorsi T4-T8  Lymphadenopathy:     Head:     Right  side of head: No submental, submandibular, tonsillar, preauricular, posterior auricular or occipital adenopathy.     Left side of head: No submental, submandibular, tonsillar, preauricular, posterior auricular or occipital adenopathy.     Cervical: No cervical adenopathy.     Right cervical: No superficial cervical adenopathy.    Left cervical: No superficial cervical adenopathy.  Skin:    General: Skin is warm and dry.     Capillary Refill: Capillary refill takes less than 2 seconds.     Coloration: Skin is not ashen, cyanotic, jaundiced, mottled, pale or sallow.     Findings: No abrasion, abscess, acne, bruising, burn, ecchymosis, erythema, signs of injury, laceration, lesion, petechiae, rash or wound.     Nails: There is no clubbing.   Neurological:     General: No focal deficit present.     Mental Status: She is alert and oriented to person, place, and time. Mental status is at baseline. She is not disoriented.     GCS: GCS eye subscore is 4. GCS verbal subscore is 5. GCS motor subscore is 6.     Cranial Nerves: Cranial nerves are intact. No cranial nerve deficit, dysarthria or facial asymmetry.     Sensory: Sensation is intact. No sensory deficit.     Motor: Motor function is intact. No  weakness, tremor, atrophy, abnormal muscle tone or seizure activity.     Coordination: Coordination is intact. Coordination normal.     Gait: Gait is intact. Gait normal.     Deep Tendon Reflexes: Reflexes normal.     Reflex Scores:      Brachioradialis reflexes are 2+ on the right side and 2+ on the left side.      Patellar reflexes are 2+ on the right side and 2+ on the left side.    Comments: Strength 5/5 bilaterally arms/legs/hand grasp; gait sure and steady in exam room; on/off exam table and in/out of chair without difficulty  Psychiatric:        Attention and Perception: Attention and perception normal. She is attentive.        Mood and Affect: Mood and affect normal.        Speech: Speech normal.        Behavior: Behavior normal. Behavior is cooperative.        Thought Content: Thought content normal.        Cognition and Memory: Cognition and memory normal.        Judgment: Judgment normal.           Assessment & Plan:  A-strain right latissmus dorsi initial visit; family distress and elevated blood pressure  P-cyclobenazeprine/flexeril 10mg  sig take 1/2 to 1 tab po qhs prn muscle spasms #30 RF0 dispensed from [facility].  Ibuprofen 800mg  po BID prn pain x 2 weeks with food #30 RF0 dispensed from [facility].  Avoid alcohol intake and driving while taking cyclobenazeprine/flexeril as drowsiness common side effect.  Slow position changes as medication also lower blood pressure/may cause dizzyness.  Tylenol 1000mg  po QID prn pain given 4 UD from clinic stock and Biofreeze gel apply topical QID prn pain given 4 UD from clinic stock.   Discussed with patient flexeril plus wearing face mask and less oral intake fluids could worsen dizzyness through mild dehydration and low BP.  Home stretches demonstrated to patient-e.g. Arm circles, walking up wall, chest stretches, neck AROM, chin tucks, knee to chest and rock side to side on back. Self massage or professional prn, foam roller use or  tennis/racquetball.  Heat/cryotherapy 15 minutes QID prn.  Trial thermacare 1 applied to scapula/latissmus dorsi right from clinic stock.  Consider physical therapy referral if no improvement with prescribed therapy from [facility] and/or chiropractic care.  Ensure ergonomics correct desk at work avoid repetitive motions if possible/holding phone/laptop in hand use desk/stand and/or break up lifting items into smaller loads/weights.  Patient was instructed to rest, ice, and ROM exercises.  Activity as tolerated.   Follow up if symptoms persist or worsen especially if loss of bowel/bladder control, arm/leg weakness and/or saddle paresthesias for same day re-eval otherwise schedule appt 2 weeks re-eval.  [facility] handout on muscle spasms and shoulder/thoraic back strain rehab exercises printed and given to patient.  Patient verbalized agreement and understanding of treatment plan and had no further questions at this time.  P2:  Injury Prevention and Fitness.  Reminded patient [facility] can be of assistance with family stressors and to contact her via telephone/email to schedule appt.  Discussed RN Hildred Alamin can assist if difficulty with contacting her.  Grieving loss of mother. Father requiring full ADL care and she is working full time.  PCM assisting with hospice benefits and she is also in contact with [facility] to determine father's medical benefits with them.  Denied HI/SI just tired/worn out.  Patient verbalized understanding information/instructions, agreed with plan of care and had no further questions at this time.  Acute pain and on motrin that counteracts her BP medications.  Given tylenol 1000mg  po QID prn pain today along with biofreeze and flexeril refill discussed could lower BP.  Family stressors also contributing discussed EAP self-referral with patient.  ER if chest pain, worst headache of life, dyspnea or visual changes for re-evaluation.  Will see RN Hildred Alamin for repeat BP check.   Patient verbalized  understanding information/instructions, agreed with plan of care and had no further questions at this time.

## 2018-12-02 ENCOUNTER — Other Ambulatory Visit: Payer: Self-pay | Admitting: Family Medicine

## 2018-12-03 NOTE — Telephone Encounter (Signed)
Please advise, pt has not been seen for BP in almost a year

## 2018-12-04 ENCOUNTER — Ambulatory Visit: Payer: PRIVATE HEALTH INSURANCE | Admitting: Family Medicine

## 2018-12-04 ENCOUNTER — Other Ambulatory Visit: Payer: Self-pay

## 2018-12-04 ENCOUNTER — Encounter: Payer: Self-pay | Admitting: Family Medicine

## 2018-12-04 ENCOUNTER — Ambulatory Visit (INDEPENDENT_AMBULATORY_CARE_PROVIDER_SITE_OTHER): Payer: PRIVATE HEALTH INSURANCE

## 2018-12-04 VITALS — BP 122/84 | HR 69 | Temp 98.0°F | Resp 17 | Ht 61.0 in | Wt 197.0 lb

## 2018-12-04 DIAGNOSIS — F330 Major depressive disorder, recurrent, mild: Secondary | ICD-10-CM

## 2018-12-04 DIAGNOSIS — E038 Other specified hypothyroidism: Secondary | ICD-10-CM

## 2018-12-04 DIAGNOSIS — Z23 Encounter for immunization: Secondary | ICD-10-CM | POA: Diagnosis not present

## 2018-12-04 DIAGNOSIS — E785 Hyperlipidemia, unspecified: Secondary | ICD-10-CM

## 2018-12-04 DIAGNOSIS — I10 Essential (primary) hypertension: Secondary | ICD-10-CM

## 2018-12-04 LAB — LIPID PANEL
Cholesterol: 198 mg/dL (ref 0–200)
HDL: 67.3 mg/dL (ref >39.00)
LDL Cholesterol: 110 mg/dL — ABNORMAL HIGH (ref 0–99)
NonHDL: 130.3
Total CHOL/HDL Ratio: 3
Triglycerides: 102 mg/dL (ref 0.0–149.0)
VLDL: 20.4 mg/dL (ref 0.0–40.0)

## 2018-12-04 LAB — BASIC METABOLIC PANEL
BUN: 14 mg/dL (ref 6–23)
CO2: 29 mEq/L (ref 19–32)
Calcium: 10.1 mg/dL (ref 8.4–10.5)
Chloride: 101 mEq/L (ref 96–112)
Creatinine, Ser: 0.64 mg/dL (ref 0.40–1.20)
GFR: 114.03 mL/min (ref >60.00)
Glucose, Bld: 107 mg/dL — ABNORMAL HIGH (ref 70–99)
Potassium: 3.9 mEq/L (ref 3.5–5.1)
Sodium: 139 mEq/L (ref 135–145)

## 2018-12-04 LAB — HEPATIC FUNCTION PANEL
ALT: 15 U/L (ref 0–35)
AST: 20 U/L (ref 0–37)
Albumin: 4.6 g/dL (ref 3.5–5.2)
Alkaline Phosphatase: 85 U/L (ref 39–117)
Bilirubin, Direct: 0.1 mg/dL (ref 0.0–0.3)
Total Bilirubin: 0.5 mg/dL (ref 0.2–1.2)
Total Protein: 7.5 g/dL (ref 6.0–8.3)

## 2018-12-04 LAB — TSH: TSH: 0.91 u[IU]/mL (ref 0.35–4.50)

## 2018-12-04 MED ORDER — LEVOTHYROXINE SODIUM 50 MCG PO TABS: 50.0000 ug | ORAL_TABLET | Freq: Every day | ORAL | 1 refills | Status: DC

## 2018-12-04 MED ORDER — AMLODIPINE BESYLATE 10 MG PO TABS: 10.0000 mg | ORAL_TABLET | Freq: Every day | ORAL | 1 refills | Status: DC

## 2018-12-04 MED ORDER — SERTRALINE HCL 25 MG PO TABS: 25.0000 mg | ORAL_TABLET | Freq: Every day | ORAL | 1 refills | Status: DC

## 2018-12-04 MED ORDER — HYDROCHLOROTHIAZIDE 25 MG PO TABS: 25.0000 mg | ORAL_TABLET | Freq: Every day | ORAL | 1 refills | Status: DC

## 2018-12-04 MED ORDER — ATENOLOL 50 MG PO TABS: 100.0000 mg | ORAL_TABLET | Freq: Every day | ORAL | 1 refills | Status: DC

## 2018-12-04 MED ORDER — SIMVASTATIN 20 MG PO TABS: 20.0000 mg | ORAL_TABLET | Freq: Every day | ORAL | 1 refills | Status: DC

## 2018-12-04 NOTE — Assessment & Plan Note (Signed)
Chronic problem.  Well controlled today.  Asymptomatic.  Check labs.  No anticipated med changes.  Will follow. 

## 2018-12-04 NOTE — Telephone Encounter (Signed)
Can we see about scheduling pt

## 2018-12-04 NOTE — Telephone Encounter (Signed)
Pt needs appt for BP and cholesterol in order to keep filling prescriptions

## 2018-12-04 NOTE — Assessment & Plan Note (Signed)
Chronic problem.  Currently asymptomatic.  Check labs.  Adjust meds prn  

## 2018-12-04 NOTE — Assessment & Plan Note (Signed)
Deteriorated w/ mom's recent death.  She feels medication dose is currently adequate.  She is going to start grief counseling through Hospice.  Will follow.

## 2018-12-04 NOTE — Assessment & Plan Note (Signed)
Chronic problem.  Tolerating statin w/o difficulty.  Check labs.  Adjust meds prn  

## 2018-12-04 NOTE — Patient Instructions (Signed)
Schedule your complete physical in 6 months (will do pap at this time) We'll notify you of your lab results and make any changes if needed Continue to work on healthy diet and regular exercise- you can do it! Call with any questions or concerns Hang in there! Stay Safe!!

## 2018-12-04 NOTE — Progress Notes (Signed)
   Subjective:    Patient ID: Jamie Kelley, female    DOB: 06-10-1955, 63 y.o.   MRN: [id number]  HPI HTN- chronic problem, well controlled.  Currently on Amlodipine 10mg  daily, Atenolol 100mg  daily, HCTZ 25mg  daily.  Denies CP, SOB, HAs, visual changes, edema.  Hyperlipidemia- chronic problem, on Simvastatin 20mg  daily.  Denies abd pain, N/V.  Hypothyroid- chronic problem, on Levothyroxine 21mcg daily.  Denies change in energy.  No changes to skin/hair/nails.  Depression- chronic problem, mom passed away in 07/07/22.  Currently on Sertraline 25mg  daily.  Pt feels current medication is appropriate.  Plans to start grief counseling through Hospice.   Review of Systems For ROS see HPI     Objective:   Physical Exam Vitals signs reviewed.  Constitutional:      General: She is not in acute distress.    Appearance: Normal appearance. She is well-developed. She is obese.  HENT:     Head: Normocephalic and atraumatic.  Eyes:     Conjunctiva/sclera: Conjunctivae normal.     Pupils: Pupils are equal, round, and reactive to light.  Neck:     Musculoskeletal: Normal range of motion and neck supple.     Thyroid: Thyromegaly (multiple nodules present) present.  Cardiovascular:     Rate and Rhythm: Normal rate and regular rhythm.     Heart sounds: Normal heart sounds. No murmur.  Pulmonary:     Effort: Pulmonary effort is normal. No respiratory distress.     Breath sounds: Normal breath sounds.  Abdominal:     General: There is no distension.     Palpations: Abdomen is soft.     Tenderness: There is no abdominal tenderness.  Lymphadenopathy:     Cervical: No cervical adenopathy.  Skin:    General: Skin is warm and dry.  Neurological:     Mental Status: She is alert and oriented to person, place, and time.  Psychiatric:        Behavior: Behavior normal.           Assessment & Plan:

## 2018-12-05 ENCOUNTER — Encounter: Payer: Self-pay | Admitting: General Practice

## 2018-12-05 LAB — CBC WITH DIFFERENTIAL/PLATELET
Basophils Absolute: 0.1 10*3/uL (ref 0.0–0.1)
Basophils Relative: 0.9 % (ref 0.0–3.0)
Eosinophils Absolute: 0.1 10*3/uL (ref 0.0–0.7)
Eosinophils Relative: 1.4 % (ref 0.0–5.0)
HCT: 42.4 % (ref 36.0–46.0)
Hemoglobin: 14.2 g/dL (ref 12.0–15.0)
Lymphocytes Relative: 34.5 % (ref 12.0–46.0)
Lymphs Abs: 2.5 10*3/uL (ref 0.7–4.0)
MCHC: 33.5 g/dL (ref 30.0–36.0)
MCV: 91.1 fl (ref 78.0–100.0)
Monocytes Absolute: 0.3 10*3/uL (ref 0.1–1.0)
Monocytes Relative: 4.6 % (ref 3.0–12.0)
Neutro Abs: 4.3 10*3/uL (ref 1.4–7.7)
Neutrophils Relative %: 58.6 % (ref 43.0–77.0)
Platelets: 208 10*3/uL (ref 150.0–400.0)
RBC: 4.65 Mil/uL (ref 3.87–5.11)
RDW: 14.2 % (ref 11.5–15.5)
WBC: 7.4 10*3/uL (ref 4.0–10.5)

## 2018-12-10 LAB — HM MAMMOGRAPHY

## 2018-12-23 ENCOUNTER — Encounter: Payer: Self-pay | Admitting: General Practice

## 2018-12-31 ENCOUNTER — Other Ambulatory Visit: Payer: Self-pay | Admitting: Family Medicine

## 2019-06-05 ENCOUNTER — Telehealth: Payer: Self-pay | Admitting: *Deleted

## 2019-06-05 ENCOUNTER — Ambulatory Visit: Payer: Self-pay | Admitting: *Deleted

## 2019-06-05 ENCOUNTER — Encounter: Payer: Self-pay | Admitting: Family Medicine

## 2019-06-05 ENCOUNTER — Telehealth (INDEPENDENT_AMBULATORY_CARE_PROVIDER_SITE_OTHER): Payer: PRIVATE HEALTH INSURANCE | Admitting: Family Medicine

## 2019-06-05 ENCOUNTER — Other Ambulatory Visit: Payer: Self-pay

## 2019-06-05 VITALS — BP 136/89 | HR 65 | Temp 97.3°F | Wt 192.0 lb

## 2019-06-05 DIAGNOSIS — I10 Essential (primary) hypertension: Secondary | ICD-10-CM

## 2019-06-05 DIAGNOSIS — Z0000 Encounter for general adult medical examination without abnormal findings: Secondary | ICD-10-CM | POA: Diagnosis not present

## 2019-06-05 DIAGNOSIS — E559 Vitamin D deficiency, unspecified: Secondary | ICD-10-CM

## 2019-06-05 DIAGNOSIS — Z1211 Encounter for screening for malignant neoplasm of colon: Secondary | ICD-10-CM

## 2019-06-05 MED ORDER — AMLODIPINE BESYLATE 10 MG PO TABS: 10.0000 mg | ORAL_TABLET | Freq: Every day | ORAL | 1 refills | Status: DC

## 2019-06-05 MED ORDER — LEVOTHYROXINE SODIUM 50 MCG PO TABS: ORAL_TABLET | ORAL | 1 refills | Status: DC

## 2019-06-05 MED ORDER — ATENOLOL 50 MG PO TABS: 100.0000 mg | ORAL_TABLET | Freq: Every day | ORAL | 1 refills | Status: DC

## 2019-06-05 MED ORDER — SIMVASTATIN 20 MG PO TABS: 20.0000 mg | ORAL_TABLET | Freq: Every day | ORAL | 1 refills | Status: DC

## 2019-06-05 NOTE — Telephone Encounter (Signed)
Good morning Dr. Birdie Riddle. Jamie Kelley was just in the clinic and advised me of your request for a BP check and a list of the medications she can have filled through our on-site dispensary at no cost to her.  Vitals with BMI 06/05/2019  Height -  Weight 192 lbs  BMI -  Systolic [id number]  Diastolic 89  Pulse 65   The following medications can be entered to pharmacy "[facility]" which I have added to her pharmacy list. They will default to "Print" but the hard copies can be shredded as I will just view them here in [facility].   Amlodipine 10mg  po daily Atenolol 50mg  po daily Levothyroxine 82mcg 1/2 tab po daily Simvastatin 20mg  po daily  Please let me know if you have any questions, concerns, or if we can assist in any other way.  Thank you,  [facility]

## 2019-06-05 NOTE — Progress Notes (Signed)
I have discussed the procedure for the virtual visit with the patient who has given consent to proceed with assessment and treatment.   Jessica L Brodmerkel, CMA     

## 2019-06-05 NOTE — Telephone Encounter (Signed)
Prescriptions filled/sent.  Thank you!

## 2019-06-05 NOTE — Progress Notes (Signed)
VS for pcp after virtual visit.

## 2019-06-05 NOTE — Progress Notes (Signed)
Virtual Visit via Video   I connected with patient on 06/05/19 at  9:00 AM EDT by a video enabled telemedicine application and verified that I am speaking with the correct person using two identifiers.  Location patient: Home Location provider: Acupuncturist, Office Persons participating in the virtual visit: Patient, Provider, Marine (Jess B)  I discussed the limitations of evaluation and management by telemedicine and the availability of in person appointments. The patient expressed understanding and agreed to proceed.  Subjective:   HPI:   CPE- due for pap, colonoscopy.  Pt declines colonoscopy, will complete cologuard.  Pt has had 1st COVID vaccine and is scheduled for 2nd next week.  Health Maintenance  Topic Date Due  . PAP SMEAR-Modifier  Never done  . COLONOSCOPY  Never done  . TETANUS/TDAP  12/04/2019 (Originally 08/11/1976)  . Hepatitis C Screening  06/04/2020 (Originally 08-Mar-1955)  . HIV Screening  06/04/2020 (Originally 08/11/1972)  . INFLUENZA VACCINE  09/27/2019  . MAMMOGRAM  12/09/2020     ROS:  Patient reports no vision/ hearing changes, adenopathy,fever, weight change,  persistant/recurrent hoarseness , swallowing issues, chest pain, palpitations, edema, persistant/recurrent cough, hemoptysis, dyspnea (rest/exertional/paroxysmal nocturnal), gastrointestinal bleeding (melena, rectal bleeding), abdominal pain, significant heartburn, bowel changes, GU symptoms (dysuria, hematuria, incontinence), Gyn symptoms (abnormal  bleeding, pain),  syncope, focal weakness, memory loss, numbness & tingling, skin/hair/nail changes, abnormal bruising or bleeding, anxiety, or depression.   Patient Active Problem List   Diagnosis Date Noted  . Depression 12/25/2017  . Contact dermatitis 07/06/2015  . Cellulitis 07/06/2015  . Chest pain 11/19/2014  . Thyromegaly 10/22/2014  . Hyperlipidemia 10/22/2014  . HTN (hypertension) 05/29/2012  . Hypothyroidism 05/29/2012  .  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 Smoking status: Never Smoker  . Smokeless tobacco: Never Used  Substance Use Topics  . Alcohol use: No    Current Outpatient Medications:  .  albuterol (VENTOLIN HFA) 108 (90 Base) MCG/ACT inhaler, Inhale 1-2 puffs into the lungs every 4 (four) hours as needed for wheezing or shortness of breath., Disp: 1 Inhaler, Rfl: 1 .  amLODipine (NORVASC) 10 MG tablet, Take 1 tablet (10 mg total) by mouth daily., Disp: 90 tablet, Rfl: 1 .  atenolol (TENORMIN) 50 MG tablet, Take 2 tablets (100 mg total) by mouth daily., Disp: 180 tablet, Rfl: 1 .  azelastine (OPTIVAR) 0.05 % ophthalmic solution, , Disp: , Rfl:  .  cetirizine (ZYRTEC) 10 MG tablet, Take 10 mg by mouth daily as needed for allergies., Disp: , Rfl:  .  hydrochlorothiazide (HYDRODIURIL) 25 MG tablet, Take 1 tablet (25 mg total) by mouth daily., Disp: 90 tablet, Rfl: 1 .  levocetirizine (XYZAL) 5 MG tablet, , Disp: , Rfl:  .  levothyroxine (SYNTHROID) 50 MCG tablet, TAKE 1/2 (ONE-HALF) TABLET BY MOUTH ONCE DAILY BEFORE BREAKFAST, Disp: 45 tablet, Rfl: 0 .  montelukast (SINGULAIR) 10 MG tablet, , Disp: , Rfl:  .  sertraline (ZOLOFT) 25 MG tablet, Take 1 tablet by mouth once daily, Disp: 30 tablet, Rfl: 0 .  simvastatin (ZOCOR) 20 MG tablet, Take 1 tablet (20 mg total) by mouth at bedtime., Disp: 90 tablet, Rfl: 1 .  fluticasone (FLONASE) 50 MCG/ACT nasal spray, Place 1 spray into both nostrils 2 (two) times daily., Disp: 16 g, Rfl: 0 .  sodium chloride (OCEAN) 0.65 % SOLN nasal spray, Place 2 sprays into both nostrils every 2 (two) hours while awake for 30 days., Disp: ,  Rfl: 0  No Known Allergies  Objective:   LMP 03/10/2012  AAOx3, NAD NCAT, EOMI No obvious CN deficits Coloring WNL Pt is able to speak clearly, coherently without shortness of breath or increased work of breathing.  Thought process is linear.  Mood is appropriate.   Assessment and Plan:   CPE-  UTD on flu, COVID.  Due for pap- declines.  UTD on mammo.  Declines colonoscopy- will do cologuard.  Check labs.  Anticipatory guidance provided.    Annye Asa, MD 06/05/2019

## 2019-06-08 ENCOUNTER — Other Ambulatory Visit (INDEPENDENT_AMBULATORY_CARE_PROVIDER_SITE_OTHER): Payer: PRIVATE HEALTH INSURANCE

## 2019-06-08 DIAGNOSIS — E559 Vitamin D deficiency, unspecified: Secondary | ICD-10-CM | POA: Diagnosis not present

## 2019-06-08 DIAGNOSIS — I10 Essential (primary) hypertension: Secondary | ICD-10-CM | POA: Diagnosis not present

## 2019-06-08 LAB — CBC WITH DIFFERENTIAL/PLATELET
Basophils Absolute: 0.1 10*3/uL (ref 0.0–0.1)
Basophils Relative: 0.8 % (ref 0.0–3.0)
Eosinophils Absolute: 0.1 10*3/uL (ref 0.0–0.7)
Eosinophils Relative: 1 % (ref 0.0–5.0)
HCT: 39 % (ref 36.0–46.0)
Hemoglobin: 13.3 g/dL (ref 12.0–15.0)
Lymphocytes Relative: 32.9 % (ref 12.0–46.0)
Lymphs Abs: 2.9 10*3/uL (ref 0.7–4.0)
MCHC: 34.1 g/dL (ref 30.0–36.0)
MCV: 89.6 fl (ref 78.0–100.0)
Monocytes Absolute: 0.6 10*3/uL (ref 0.1–1.0)
Monocytes Relative: 7 % (ref 3.0–12.0)
Neutro Abs: 5.2 10*3/uL (ref 1.4–7.7)
Neutrophils Relative %: 58.3 % (ref 43.0–77.0)
Platelets: 189 10*3/uL (ref 150.0–400.0)
RBC: 4.35 Mil/uL (ref 3.87–5.11)
RDW: 14.5 % (ref 11.5–15.5)
WBC: 8.9 10*3/uL (ref 4.0–10.5)

## 2019-06-08 LAB — LIPID PANEL
Cholesterol: 182 mg/dL (ref 0–200)
HDL: 58.9 mg/dL (ref >39.00)
NonHDL: 123.46
Total CHOL/HDL Ratio: 3
Triglycerides: 206 mg/dL — ABNORMAL HIGH (ref 0.0–149.0)
VLDL: 41.2 mg/dL — ABNORMAL HIGH (ref 0.0–40.0)

## 2019-06-08 LAB — BASIC METABOLIC PANEL
BUN: 14 mg/dL (ref 6–23)
CO2: 28 mEq/L (ref 19–32)
Calcium: 9.5 mg/dL (ref 8.4–10.5)
Chloride: 104 mEq/L (ref 96–112)
Creatinine, Ser: 0.84 mg/dL (ref 0.40–1.20)
GFR: 83.18 mL/min (ref >60.00)
Glucose, Bld: 102 mg/dL — ABNORMAL HIGH (ref 70–99)
Potassium: 3.5 mEq/L (ref 3.5–5.1)
Sodium: 141 mEq/L (ref 135–145)

## 2019-06-08 LAB — HEPATIC FUNCTION PANEL
ALT: 15 U/L (ref 0–35)
AST: 19 U/L (ref 0–37)
Albumin: 4.4 g/dL (ref 3.5–5.2)
Alkaline Phosphatase: 77 U/L (ref 39–117)
Bilirubin, Direct: 0.1 mg/dL (ref 0.0–0.3)
Total Bilirubin: 0.4 mg/dL (ref 0.2–1.2)
Total Protein: 7.3 g/dL (ref 6.0–8.3)

## 2019-06-08 LAB — LDL CHOLESTEROL, DIRECT: Direct LDL: 87 mg/dL

## 2019-06-08 LAB — VITAMIN D 25 HYDROXY (VIT D DEFICIENCY, FRACTURES): VITD: 11.83 ng/mL — ABNORMAL LOW (ref 30.00–100.00)

## 2019-06-08 LAB — TSH: TSH: 1.29 u[IU]/mL (ref 0.35–4.50)

## 2019-06-08 NOTE — Telephone Encounter (Signed)
Received and filling for Ann. Have a great day. Thanks!

## 2019-06-09 ENCOUNTER — Other Ambulatory Visit: Payer: Self-pay | Admitting: General Practice

## 2019-06-09 MED ORDER — VITAMIN D (ERGOCALCIFEROL) 1.25 MG (50000 UNIT) PO CAPS: 50000.0000 [IU] | ORAL_CAPSULE | ORAL | 0 refills | Status: DC

## 2019-07-06 ENCOUNTER — Encounter: Payer: Self-pay | Admitting: Family Medicine

## 2019-07-06 LAB — COLOGUARD

## 2019-07-16 LAB — COLOGUARD: COLOGUARD: NEGATIVE

## 2019-08-19 DIAGNOSIS — M25561 Pain in right knee: Secondary | ICD-10-CM | POA: Insufficient documentation

## 2019-10-20 ENCOUNTER — Other Ambulatory Visit: Payer: Self-pay

## 2019-10-20 ENCOUNTER — Ambulatory Visit: Payer: Self-pay | Admitting: *Deleted

## 2019-10-20 VITALS — BP 138/91 | HR 65

## 2019-10-20 DIAGNOSIS — I10 Essential (primary) hypertension: Secondary | ICD-10-CM

## 2019-10-21 ENCOUNTER — Telehealth: Payer: Self-pay | Admitting: Registered Nurse

## 2019-10-21 ENCOUNTER — Encounter: Payer: Self-pay | Admitting: Registered Nurse

## 2019-10-21 NOTE — Telephone Encounter (Signed)
Late entry patient reported feeling like her blood pressure might be high and would like blood pressure check and advice on 8/24 1332 walked into [facility].  Her air conditioning at home broken all weekend, fixed now but she thinks feeling off today due to difficulty sleeping without air conditioning over the weekend and hot temps.  A&Ox3 skin warm dry and pink respirations even and unlabored gait sure and steady in clinic normal coordination/muscle tone.  In/out of chair without difficulty pulse RRR.  Wearing cloth reusable mask due to covid 19 pandemic.  She is covid vaccinated.    Discussed blood pressure a little elevated.  Compared to last on file similar 136/89 06/05/2019 per Epic.  Patient has been taking her blood pressure medications eating regular diet protein/vegetables/fruits/grains.  Denied n/v/d/muscle cramps/syncope/dizzyness/chest pain/dyspnea/rash.  Discussed with patient increase water intake as she reported urine gold in toilet.  Discussed probable mild dehydration from extra sweating this weekend without ac and she reported usual intake of fluids not increasing.  Try a no sugar added powerade or gatorade today to get some electrolytes also.  Discussed clinic closed tomorrow but can have another BP check on 8/26 with RN Hildred Alamin if still not feeling her usual self and I will be in clinic if she needs appt at that time.  Discussed with patient NP appt hours stopped at 1200 today but I had to stay late in clinic to wrap up patient care today.  Seek same day re-evaluation if chest pain, dyspnea, worst headache of life, syncope with ER/UC/PCM.  Patient agreed with plan of care, verbalized understanding information/instructions and had no further questions at this time.  Per Epic review last labs 06/08/2019 normal electrolytes/renal function/CBC/TSH; slightly elevated glucose.  Last PCM visit 06/05/2019 virtual physical

## 2019-11-17 ENCOUNTER — Other Ambulatory Visit: Payer: Self-pay | Admitting: *Deleted

## 2019-11-17 MED ORDER — ALBUTEROL SULFATE HFA 108 (90 BASE) MCG/ACT IN AERS: 1.0000 | INHALATION_SPRAY | RESPIRATORY_TRACT | 1 refills | Status: DC | PRN

## 2019-12-10 ENCOUNTER — Ambulatory Visit: Payer: PRIVATE HEALTH INSURANCE | Admitting: Family Medicine

## 2019-12-10 ENCOUNTER — Other Ambulatory Visit: Payer: Self-pay

## 2019-12-10 ENCOUNTER — Encounter: Payer: Self-pay | Admitting: Family Medicine

## 2019-12-10 VITALS — BP 138/80 | HR 59 | Temp 97.7°F | Resp 17 | Wt 204.6 lb

## 2019-12-10 DIAGNOSIS — E010 Iodine-deficiency related diffuse (endemic) goiter: Secondary | ICD-10-CM

## 2019-12-10 DIAGNOSIS — I10 Essential (primary) hypertension: Secondary | ICD-10-CM | POA: Diagnosis not present

## 2019-12-10 DIAGNOSIS — E038 Other specified hypothyroidism: Secondary | ICD-10-CM | POA: Diagnosis not present

## 2019-12-10 DIAGNOSIS — Z23 Encounter for immunization: Secondary | ICD-10-CM | POA: Diagnosis not present

## 2019-12-10 DIAGNOSIS — E785 Hyperlipidemia, unspecified: Secondary | ICD-10-CM

## 2019-12-10 DIAGNOSIS — F330 Major depressive disorder, recurrent, mild: Secondary | ICD-10-CM

## 2019-12-10 LAB — CBC WITH DIFFERENTIAL/PLATELET
Basophils Absolute: 0 10*3/uL (ref 0.0–0.1)
Basophils Relative: 0.2 % (ref 0.0–3.0)
Eosinophils Absolute: 0.2 10*3/uL (ref 0.0–0.7)
Eosinophils Relative: 2.1 % (ref 0.0–5.0)
HCT: 40.7 % (ref 36.0–46.0)
Hemoglobin: 13.8 g/dL (ref 12.0–15.0)
Lymphocytes Relative: 27.1 % (ref 12.0–46.0)
Lymphs Abs: 2 10*3/uL (ref 0.7–4.0)
MCHC: 33.8 g/dL (ref 30.0–36.0)
MCV: 90.9 fl (ref 78.0–100.0)
Monocytes Absolute: 0.6 10*3/uL (ref 0.1–1.0)
Monocytes Relative: 8.2 % (ref 3.0–12.0)
Neutro Abs: 4.7 10*3/uL (ref 1.4–7.7)
Neutrophils Relative %: 62.4 % (ref 43.0–77.0)
Platelets: 216 10*3/uL (ref 150.0–400.0)
RBC: 4.48 Mil/uL (ref 3.87–5.11)
RDW: 14.6 % (ref 11.5–15.5)
WBC: 7.6 10*3/uL (ref 4.0–10.5)

## 2019-12-10 LAB — LIPID PANEL
Cholesterol: 214 mg/dL — ABNORMAL HIGH (ref 0–200)
HDL: 68.9 mg/dL (ref >39.00)
LDL Cholesterol: 126 mg/dL — ABNORMAL HIGH (ref 0–99)
NonHDL: 145.23
Total CHOL/HDL Ratio: 3
Triglycerides: 95 mg/dL (ref 0.0–149.0)
VLDL: 19 mg/dL (ref 0.0–40.0)

## 2019-12-10 LAB — HEPATIC FUNCTION PANEL
ALT: 14 U/L (ref 0–35)
AST: 17 U/L (ref 0–37)
Albumin: 4.5 g/dL (ref 3.5–5.2)
Alkaline Phosphatase: 76 U/L (ref 39–117)
Bilirubin, Direct: 0.1 mg/dL (ref 0.0–0.3)
Total Bilirubin: 0.5 mg/dL (ref 0.2–1.2)
Total Protein: 7.3 g/dL (ref 6.0–8.3)

## 2019-12-10 LAB — BASIC METABOLIC PANEL
BUN: 12 mg/dL (ref 6–23)
CO2: 29 mEq/L (ref 19–32)
Calcium: 9.9 mg/dL (ref 8.4–10.5)
Chloride: 103 mEq/L (ref 96–112)
Creatinine, Ser: 0.62 mg/dL (ref 0.40–1.20)
GFR: 96.42 mL/min (ref >60.00)
Glucose, Bld: 90 mg/dL (ref 70–99)
Potassium: 4.7 mEq/L (ref 3.5–5.1)
Sodium: 139 mEq/L (ref 135–145)

## 2019-12-10 LAB — TSH: TSH: 1.24 u[IU]/mL (ref 0.35–4.50)

## 2019-12-10 NOTE — Patient Instructions (Signed)
Schedule your complete physical in 6 months We'll notify you of your lab results and make any changes if needed We'll call you to set up your thyroid ultrasound Continue to work on healthy diet and regular exercise- you can do it!!! Call with any questions or concerns Stay Safe!  Stay Healthy!

## 2019-12-10 NOTE — Assessment & Plan Note (Signed)
Due for repeat thyroid US- ordered

## 2019-12-10 NOTE — Assessment & Plan Note (Signed)
New.  Pt has gained 12 lbs since last visit.  BMI now 38.66- and w/ multiple medical issues, this qualifies as morbidly obese.  Stressed need for healthy diet and regular exercise.  Will continue to follow.

## 2019-12-10 NOTE — Assessment & Plan Note (Signed)
Chronic problem.  Pt admits to low energy but this may be mood related.  Check labs.  Adjust meds prn

## 2019-12-10 NOTE — Progress Notes (Signed)
   Subjective:    Patient ID: Jamie Kelley, female    DOB: 05/09/1955, 64 y.o.   MRN: [id number]  HPI HTN- chronic problem, on Amlodipine 10mg  daily, Atenolol 100mg , HCTZ 25mg  daily.  No CP, SOB, HAs, visual changes, edema.  Hyperlipidemia- chronic problem, on Simvastatin 20mg  daily.  No abd pain, N/V  Hypothyroid- chronic problem, on Levothyroxine 66mcg.  Energy level is 'so-so'.  No changes to skin, hair, nails  Morbid Obesity- pt has gained 12 lbs since last visit.  No regular exercise.     Review of Systems For ROS see HPI   This visit occurred during the SARS-CoV-2 public health emergency.  Safety protocols were in place, including screening questions prior to the visit, additional usage of staff PPE, and extensive cleaning of exam room while observing appropriate contact time as indicated for disinfecting solutions.       Objective:   Physical Exam Vitals reviewed.  Constitutional:      General: She is not in acute distress.    Appearance: She is well-developed. She is obese.  HENT:     Head: Normocephalic and atraumatic.  Eyes:     Conjunctiva/sclera: Conjunctivae normal.     Pupils: Pupils are equal, round, and reactive to light.  Neck:     Thyroid: No thyromegaly.  Cardiovascular:     Rate and Rhythm: Normal rate and regular rhythm.     Heart sounds: Normal heart sounds. No murmur heard.   Pulmonary:     Effort: Pulmonary effort is normal. No respiratory distress.     Breath sounds: Normal breath sounds.  Abdominal:     General: There is no distension.     Palpations: Abdomen is soft.     Tenderness: There is no abdominal tenderness.  Musculoskeletal:     Cervical back: Normal range of motion and neck supple.     Right lower leg: No edema.     Left lower leg: No edema.  Lymphadenopathy:     Cervical: No cervical adenopathy.  Skin:    General: Skin is warm and dry.  Neurological:     Mental Status: She is alert and oriented to person, place, and time.    Psychiatric:        Behavior: Behavior normal.          Assessment & Plan:

## 2019-12-10 NOTE — Assessment & Plan Note (Signed)
Pt decided to stop medication b/c she didn't feel things were improving.

## 2019-12-10 NOTE — Assessment & Plan Note (Signed)
Chronic problem.  Adequate control on Amlodipine 10mg  daily, Atenolol 100mg  daily, and HCTZ 25mg  daily.  Asymptomatic.  Check labs but no anticipated med changes.  Will follow.

## 2019-12-10 NOTE — Assessment & Plan Note (Signed)
Chronic problem.  Tolerating Simvastatin 20mg  daily.  Check labs.  Adjust meds prn

## 2019-12-11 ENCOUNTER — Encounter: Payer: Self-pay | Admitting: General Practice

## 2019-12-18 ENCOUNTER — Ambulatory Visit
Admission: RE | Admit: 2019-12-18 | Discharge: 2019-12-18 | Disposition: A | Discharge status: Home / Self Care | Payer: PRIVATE HEALTH INSURANCE | Source: Ambulatory Visit | Attending: Family Medicine | Admitting: Family Medicine

## 2019-12-18 DIAGNOSIS — E010 Iodine-deficiency related diffuse (endemic) goiter: Secondary | ICD-10-CM

## 2019-12-21 ENCOUNTER — Other Ambulatory Visit: Payer: Self-pay | Admitting: General Practice

## 2019-12-21 DIAGNOSIS — E041 Nontoxic single thyroid nodule: Secondary | ICD-10-CM

## 2019-12-21 LAB — HM MAMMOGRAPHY

## 2019-12-23 ENCOUNTER — Encounter: Payer: Self-pay | Admitting: General Practice

## 2019-12-26 ENCOUNTER — Encounter (HOSPITAL_COMMUNITY): Payer: Self-pay

## 2019-12-26 ENCOUNTER — Telehealth: Payer: Self-pay

## 2019-12-26 ENCOUNTER — Emergency Department (HOSPITAL_COMMUNITY): Payer: No Typology Code available for payment source

## 2019-12-26 ENCOUNTER — Emergency Department (HOSPITAL_COMMUNITY)
Admission: EM | Admit: 2019-12-26 | Discharge: 2019-12-26 | Disposition: A | Discharge status: Home / Self Care | Payer: No Typology Code available for payment source | Source: Home / Self Care | Attending: Emergency Medicine | Admitting: Emergency Medicine

## 2019-12-26 ENCOUNTER — Other Ambulatory Visit: Payer: Self-pay

## 2019-12-26 ENCOUNTER — Inpatient Hospital Stay (HOSPITAL_COMMUNITY)
Admission: EM | Admit: 2019-12-26 | Discharge: 2020-01-04 | DRG: 871 | Disposition: A | Discharge status: Home / Self Care | Payer: No Typology Code available for payment source | Attending: Internal Medicine | Admitting: Internal Medicine

## 2019-12-26 DIAGNOSIS — R509 Fever, unspecified: Secondary | ICD-10-CM

## 2019-12-26 DIAGNOSIS — K529 Noninfective gastroenteritis and colitis, unspecified: Secondary | ICD-10-CM | POA: Diagnosis not present

## 2019-12-26 DIAGNOSIS — E785 Hyperlipidemia, unspecified: Secondary | ICD-10-CM | POA: Diagnosis present

## 2019-12-26 DIAGNOSIS — A419 Sepsis, unspecified organism: Secondary | ICD-10-CM | POA: Diagnosis not present

## 2019-12-26 DIAGNOSIS — Z79899 Other long term (current) drug therapy: Secondary | ICD-10-CM

## 2019-12-26 DIAGNOSIS — K922 Gastrointestinal hemorrhage, unspecified: Secondary | ICD-10-CM

## 2019-12-26 DIAGNOSIS — R197 Diarrhea, unspecified: Secondary | ICD-10-CM | POA: Insufficient documentation

## 2019-12-26 DIAGNOSIS — J189 Pneumonia, unspecified organism: Secondary | ICD-10-CM | POA: Diagnosis present

## 2019-12-26 DIAGNOSIS — I2693 Single subsegmental pulmonary embolism without acute cor pulmonale: Secondary | ICD-10-CM | POA: Diagnosis present

## 2019-12-26 DIAGNOSIS — Z79890 Hormone replacement therapy (postmenopausal): Secondary | ICD-10-CM

## 2019-12-26 DIAGNOSIS — E039 Hypothyroidism, unspecified: Secondary | ICD-10-CM | POA: Insufficient documentation

## 2019-12-26 DIAGNOSIS — R1084 Generalized abdominal pain: Secondary | ICD-10-CM | POA: Insufficient documentation

## 2019-12-26 DIAGNOSIS — D649 Anemia, unspecified: Secondary | ICD-10-CM | POA: Diagnosis present

## 2019-12-26 DIAGNOSIS — R0902 Hypoxemia: Secondary | ICD-10-CM

## 2019-12-26 DIAGNOSIS — Z20822 Contact with and (suspected) exposure to covid-19: Secondary | ICD-10-CM | POA: Diagnosis present

## 2019-12-26 DIAGNOSIS — I10 Essential (primary) hypertension: Secondary | ICD-10-CM | POA: Diagnosis present

## 2019-12-26 DIAGNOSIS — Z8249 Family history of ischemic heart disease and other diseases of the circulatory system: Secondary | ICD-10-CM

## 2019-12-26 DIAGNOSIS — E871 Hypo-osmolality and hyponatremia: Secondary | ICD-10-CM | POA: Diagnosis present

## 2019-12-26 DIAGNOSIS — R7303 Prediabetes: Secondary | ICD-10-CM | POA: Diagnosis present

## 2019-12-26 DIAGNOSIS — R109 Unspecified abdominal pain: Secondary | ICD-10-CM | POA: Diagnosis not present

## 2019-12-26 DIAGNOSIS — I2699 Other pulmonary embolism without acute cor pulmonale: Secondary | ICD-10-CM

## 2019-12-26 DIAGNOSIS — E042 Nontoxic multinodular goiter: Secondary | ICD-10-CM | POA: Diagnosis present

## 2019-12-26 DIAGNOSIS — R6520 Severe sepsis without septic shock: Secondary | ICD-10-CM | POA: Diagnosis present

## 2019-12-26 DIAGNOSIS — M1990 Unspecified osteoarthritis, unspecified site: Secondary | ICD-10-CM | POA: Diagnosis present

## 2019-12-26 DIAGNOSIS — E669 Obesity, unspecified: Secondary | ICD-10-CM | POA: Diagnosis present

## 2019-12-26 DIAGNOSIS — R739 Hyperglycemia, unspecified: Secondary | ICD-10-CM | POA: Diagnosis present

## 2019-12-26 DIAGNOSIS — K625 Hemorrhage of anus and rectum: Secondary | ICD-10-CM | POA: Diagnosis not present

## 2019-12-26 DIAGNOSIS — J9601 Acute respiratory failure with hypoxia: Secondary | ICD-10-CM | POA: Diagnosis present

## 2019-12-26 DIAGNOSIS — J9811 Atelectasis: Secondary | ICD-10-CM | POA: Diagnosis present

## 2019-12-26 DIAGNOSIS — Z833 Family history of diabetes mellitus: Secondary | ICD-10-CM

## 2019-12-26 DIAGNOSIS — Z6839 Body mass index (BMI) 39.0-39.9, adult: Secondary | ICD-10-CM

## 2019-12-26 DIAGNOSIS — R0602 Shortness of breath: Secondary | ICD-10-CM

## 2019-12-26 DIAGNOSIS — A040 Enteropathogenic Escherichia coli infection: Secondary | ICD-10-CM | POA: Diagnosis present

## 2019-12-26 DIAGNOSIS — E876 Hypokalemia: Secondary | ICD-10-CM | POA: Diagnosis present

## 2019-12-26 HISTORY — DX: Hypothyroidism, unspecified: E03.9

## 2019-12-26 HISTORY — DX: Unspecified osteoarthritis, unspecified site: M19.90

## 2019-12-26 LAB — COMPREHENSIVE METABOLIC PANEL
ALT: 20 U/L (ref 0–44)
ALT: 18 U/L (ref 0–44)
AST: 22 U/L (ref 15–41)
AST: 19 U/L (ref 15–41)
Albumin: 4.4 g/dL (ref 3.5–5.0)
Albumin: 4.3 g/dL (ref 3.5–5.0)
Alkaline Phosphatase: 64 U/L (ref 38–126)
Alkaline Phosphatase: 68 U/L (ref 38–126)
Anion gap: 11 (ref 5–15)
Anion gap: 14 (ref 5–15)
BUN: 9 mg/dL (ref 8–23)
BUN: 7 mg/dL — ABNORMAL LOW (ref 8–23)
CO2: 24 mmol/L (ref 22–32)
CO2: 23 mmol/L (ref 22–32)
Calcium: 9 mg/dL (ref 8.9–10.3)
Calcium: 9.1 mg/dL (ref 8.9–10.3)
Chloride: 103 mmol/L (ref 98–111)
Chloride: 104 mmol/L (ref 98–111)
Creatinine, Ser: 0.66 mg/dL (ref 0.44–1.00)
Creatinine, Ser: 0.63 mg/dL (ref 0.44–1.00)
GFR, Estimated: >60 mL/min (ref >60)
GFR, Estimated: >60 mL/min (ref >60)
Glucose, Bld: 117 mg/dL — ABNORMAL HIGH (ref 70–99)
Glucose, Bld: 135 mg/dL — ABNORMAL HIGH (ref 70–99)
Potassium: 3.4 mmol/L — ABNORMAL LOW (ref 3.5–5.1)
Potassium: 3.5 mmol/L (ref 3.5–5.1)
Sodium: 138 mmol/L (ref 135–145)
Sodium: 141 mmol/L (ref 135–145)
Total Bilirubin: 0.6 mg/dL (ref 0.3–1.2)
Total Bilirubin: 0.9 mg/dL (ref 0.3–1.2)
Total Protein: 7.6 g/dL (ref 6.5–8.1)
Total Protein: 7.7 g/dL (ref 6.5–8.1)

## 2019-12-26 LAB — CBC WITH DIFFERENTIAL/PLATELET
Abs Immature Granulocytes: 0.03 10*3/uL (ref 0.00–0.07)
Abs Immature Granulocytes: 0.05 10*3/uL (ref 0.00–0.07)
Basophils Absolute: 0 10*3/uL (ref 0.0–0.1)
Basophils Absolute: 0 10*3/uL (ref 0.0–0.1)
Basophils Relative: 0 %
Basophils Relative: 0 %
Eosinophils Absolute: 0.1 10*3/uL (ref 0.0–0.5)
Eosinophils Absolute: 0 10*3/uL (ref 0.0–0.5)
Eosinophils Relative: 1 %
Eosinophils Relative: 0 %
HCT: 42.1 % (ref 36.0–46.0)
HCT: 43.1 % (ref 36.0–46.0)
Hemoglobin: 14.3 g/dL (ref 12.0–15.0)
Hemoglobin: 14.4 g/dL (ref 12.0–15.0)
Immature Granulocytes: 0 %
Immature Granulocytes: 0 %
Lymphocytes Relative: 17 %
Lymphocytes Relative: 8 %
Lymphs Abs: 1.6 10*3/uL (ref 0.7–4.0)
Lymphs Abs: 1.2 10*3/uL (ref 0.7–4.0)
MCH: 30.7 pg (ref 26.0–34.0)
MCH: 30.5 pg (ref 26.0–34.0)
MCHC: 34 g/dL (ref 30.0–36.0)
MCHC: 33.4 g/dL (ref 30.0–36.0)
MCV: 90.3 fL (ref 80.0–100.0)
MCV: 91.3 fL (ref 80.0–100.0)
Monocytes Absolute: 0.6 10*3/uL (ref 0.1–1.0)
Monocytes Absolute: 0.8 10*3/uL (ref 0.1–1.0)
Monocytes Relative: 6 %
Monocytes Relative: 6 %
Neutro Abs: 7.5 10*3/uL (ref 1.7–7.7)
Neutro Abs: 11.7 10*3/uL — ABNORMAL HIGH (ref 1.7–7.7)
Neutrophils Relative %: 76 %
Neutrophils Relative %: 86 %
Platelets: 189 10*3/uL (ref 150–400)
Platelets: 187 10*3/uL (ref 150–400)
RBC: 4.66 MIL/uL (ref 3.87–5.11)
RBC: 4.72 MIL/uL (ref 3.87–5.11)
RDW: 13.9 % (ref 11.5–15.5)
RDW: 13.9 % (ref 11.5–15.5)
WBC: 9.9 10*3/uL (ref 4.0–10.5)
WBC: 13.8 10*3/uL — ABNORMAL HIGH (ref 4.0–10.5)
nRBC: 0 % (ref 0.0–0.2)
nRBC: 0 % (ref 0.0–0.2)

## 2019-12-26 LAB — URINALYSIS, ROUTINE W REFLEX MICROSCOPIC
Bilirubin Urine: NEGATIVE
Glucose, UA: NEGATIVE mg/dL
Hgb urine dipstick: NEGATIVE
Ketones, ur: 20 mg/dL — AB
Leukocytes,Ua: NEGATIVE
Nitrite: NEGATIVE
Protein, ur: NEGATIVE mg/dL
Specific Gravity, Urine: 1.026 (ref 1.005–1.030)
pH: 6 (ref 5.0–8.0)

## 2019-12-26 LAB — LACTIC ACID, PLASMA: Lactic Acid, Venous: 1 mmol/L (ref 0.5–1.9)

## 2019-12-26 LAB — LIPASE, BLOOD: Lipase: 30 U/L (ref 11–51)

## 2019-12-26 LAB — POC OCCULT BLOOD, ED: Fecal Occult Bld: POSITIVE — AB

## 2019-12-26 MED ORDER — CIPROFLOXACIN HCL 500 MG PO TABS: 500.0000 mg | ORAL_TABLET | Freq: Two times a day (BID) | ORAL | 0 refills | Status: DC

## 2019-12-26 MED ORDER — CIPROFLOXACIN IN D5W 400 MG/200ML IV SOLN
400.0000 mg | Freq: Once | INTRAVENOUS | Status: AC
  Administered 2019-12-26: 400 mg via INTRAVENOUS
  Filled 2019-12-26: qty 200

## 2019-12-26 MED ORDER — METRONIDAZOLE IN NACL 5-0.79 MG/ML-% IV SOLN
500.0000 mg | Freq: Once | INTRAVENOUS | Status: AC
  Administered 2019-12-26: 500 mg via INTRAVENOUS
  Filled 2019-12-26: qty 100

## 2019-12-26 MED ORDER — CIPROFLOXACIN IN D5W 400 MG/200ML IV SOLN
400.0000 mg | Freq: Two times a day (BID) | INTRAVENOUS | Status: DC
  Administered 2019-12-27 – 2019-12-31 (×9): 400 mg via INTRAVENOUS
  Filled 2019-12-26 (×9): qty 200

## 2019-12-26 MED ORDER — METRONIDAZOLE IN NACL 5-0.79 MG/ML-% IV SOLN
500.0000 mg | Freq: Three times a day (TID) | INTRAVENOUS | Status: DC
  Administered 2019-12-27 – 2019-12-29 (×7): 500 mg via INTRAVENOUS
  Filled 2019-12-26 (×7): qty 100

## 2019-12-26 MED ORDER — ONDANSETRON HCL 4 MG/2ML IJ SOLN
4.0000 mg | Freq: Once | INTRAMUSCULAR | Status: AC
  Administered 2019-12-26: 4 mg via INTRAVENOUS
  Filled 2019-12-26: qty 2

## 2019-12-26 MED ORDER — ONDANSETRON HCL 4 MG PO TABS
4.0000 mg | ORAL_TABLET | Freq: Four times a day (QID) | ORAL | Status: DC | PRN
  Administered 2019-12-27 – 2019-12-29 (×4): 4 mg via ORAL
  Filled 2019-12-26 (×4): qty 1

## 2019-12-26 MED ORDER — MORPHINE SULFATE (PF) 4 MG/ML IV SOLN
4.0000 mg | Freq: Once | INTRAVENOUS | Status: AC
  Administered 2019-12-26: 4 mg via INTRAVENOUS
  Filled 2019-12-26: qty 1

## 2019-12-26 MED ORDER — IOHEXOL 300 MG/ML  SOLN
100.0000 mL | Freq: Once | INTRAMUSCULAR | Status: AC | PRN
  Administered 2019-12-26: 100 mL via INTRAVENOUS

## 2019-12-26 MED ORDER — ACETAMINOPHEN 650 MG RE SUPP: 650.0000 mg | Freq: Four times a day (QID) | RECTAL | Status: DC | PRN

## 2019-12-26 MED ORDER — ACETAMINOPHEN 325 MG PO TABS
650.0000 mg | ORAL_TABLET | Freq: Four times a day (QID) | ORAL | Status: DC | PRN
  Administered 2019-12-31 – 2020-01-02 (×4): 650 mg via ORAL
  Filled 2019-12-26 (×4): qty 2

## 2019-12-26 MED ORDER — SODIUM CHLORIDE 0.9 % IV BOLUS
500.0000 mL | Freq: Once | INTRAVENOUS | Status: AC
  Administered 2019-12-26: 500 mL via INTRAVENOUS

## 2019-12-26 MED ORDER — ONDANSETRON HCL 4 MG/2ML IJ SOLN
4.0000 mg | Freq: Four times a day (QID) | INTRAMUSCULAR | Status: DC | PRN
  Administered 2019-12-27: 4 mg via INTRAVENOUS
  Filled 2019-12-26: qty 2

## 2019-12-26 MED ORDER — SODIUM CHLORIDE 0.9 % IV BOLUS
1000.0000 mL | Freq: Once | INTRAVENOUS | Status: AC
  Administered 2019-12-26: 1000 mL via INTRAVENOUS

## 2019-12-26 MED ORDER — SODIUM CHLORIDE (PF) 0.9 % IJ SOLN
INTRAMUSCULAR | Status: AC
  Filled 2019-12-26: qty 50

## 2019-12-26 MED ORDER — LACTATED RINGERS IV SOLN: INTRAVENOUS | Status: DC

## 2019-12-26 MED ORDER — MORPHINE SULFATE (PF) 2 MG/ML IV SOLN
2.0000 mg | INTRAVENOUS | Status: DC | PRN
  Administered 2019-12-27: 2 mg via INTRAVENOUS
  Administered 2019-12-27 – 2019-12-28 (×5): 4 mg via INTRAVENOUS
  Administered 2019-12-28: 2 mg via INTRAVENOUS
  Administered 2019-12-28 – 2019-12-29 (×2): 4 mg via INTRAVENOUS
  Filled 2019-12-26: qty 2
  Filled 2019-12-26: qty 1
  Filled 2019-12-26 (×7): qty 2

## 2019-12-26 MED ORDER — METRONIDAZOLE 500 MG PO TABS: 500.0000 mg | ORAL_TABLET | Freq: Two times a day (BID) | ORAL | 0 refills | Status: DC

## 2019-12-26 NOTE — H&P (Addendum)
History and Physical    Jamie Kelley JOA:[id number] DOB: 05-Aug-1955 DOA: 12/26/2019  PCP: Midge Minium, MD  Patient coming from: Home  I have personally briefly reviewed patient's old medical records in [place]  Chief Complaint: Abd pain  HPI: Jamie Kelley is a 64 y.o. female with medical history significant of HTN, HLD.  Pt seen earlier today for abd pain.  Onset 2 days ago.  Diffuse abd cramping, diarrhea.  Diarrhea is bloody.  No h/o diverticulitis nor colitis.  No fever, N/V.  Discharged home with cipro/flagyl.  Pain has persisted and pt returns to ED.  ED Course: CT earlier today shows colitis.  HGB stable at 14.  Given cipro/flagyl and hospitalist asked to admit.   Review of Systems: As per HPI, otherwise all review of systems negative.  Past Medical History:  Diagnosis Date  . Hay fever   . High blood pressure   . Hyperlipidemia   . Obesity   . Thyroid disease     Past Surgical History:  Procedure Laterality Date  . BIOPSY THYROID     Dr Constance Holster- negative for cancer     reports that she has never smoked. She has never used smokeless tobacco. She reports that she does not drink alcohol and does not use drugs.  No Known Allergies  Family History  Problem Relation Age of Onset  . Heart disease Mother   . Hypertension Mother   . Diabetes Mother   . Alzheimer's disease Mother   . Cancer Maternal Aunt        breast  . Heart disease Maternal Aunt   . Hypertension Maternal Aunt   . Breast cancer Maternal Aunt 70  . Hypertension Maternal Uncle   . Heart disease Maternal Uncle   . Heart disease Maternal Grandmother   . Hypertension Maternal Grandmother   . Diabetes Maternal Grandmother      Prior to Admission medications   Medication Sig Start Date End Date Taking? Authorizing Provider  albuterol (VENTOLIN HFA) 108 (90 Base) MCG/ACT inhaler Inhale 1-2 puffs into the lungs every 4 (four) hours as needed for wheezing or shortness of breath.  11/17/19   Betancourt, Aura Fey, NP  amLODipine (NORVASC) 10 MG tablet Take 1 tablet (10 mg total) by mouth daily. 06/05/19   Midge Minium, MD  atenolol (TENORMIN) 50 MG tablet Take 2 tablets (100 mg total) by mouth daily. 06/05/19   Midge Minium, MD  azelastine (OPTIVAR) 0.05 % ophthalmic solution  06/09/18   [provider]  ciprofloxacin (CIPRO) 500 MG tablet Take 1 tablet (500 mg total) by mouth 2 (two) times daily. One po bid x 7 days 12/26/19   Daleen Bo, MD  fluticasone [facility] 50 MCG/ACT nasal spray Place 1 spray into both nostrils 2 (two) times daily. 12/18/16 10/21/18  Betancourt, Aura Fey, NP  hydrochlorothiazide (HYDRODIURIL) 25 MG tablet Take 1 tablet (25 mg total) by mouth daily. 12/04/18   Midge Minium, MD  levothyroxine (SYNTHROID) 50 MCG tablet TAKE 1/2 (ONE-HALF) TABLET BY MOUTH ONCE DAILY BEFORE BREAKFAST 06/05/19   Midge Minium, MD  metroNIDAZOLE (FLAGYL) 500 MG tablet Take 1 tablet (500 mg total) by mouth 2 (two) times daily. One po bid x 7 days 12/26/19   Daleen Bo, MD  simvastatin (ZOCOR) 20 MG tablet Take 1 tablet (20 mg total) by mouth at bedtime. 06/05/19   Midge Minium, MD  sodium chloride (OCEAN) 0.65 % SOLN nasal spray Place 2 sprays into  both nostrils every 2 (two) hours while awake for 30 days. 04/03/18 10/21/18  Olen Cordial, NP    Physical Exam: Vitals:   12/26/19 1935 12/26/19 2215 12/26/19 2315  BP: (!) 172/105 (!) 162/94 (!) 167/98  Pulse: 94 81 88  Resp: 20 16 16   Temp: 98.9 F (37.2 C)    TempSrc: Oral    SpO2: 93% 98% 96%  Weight: 90.7 kg    Height: 5' (1.524 m)      Constitutional: NAD, calm, comfortable, slightly sleepy from morphine Eyes: PERRL, lids and conjunctivae normal ENMT: Mucous membranes are moist. Posterior pharynx clear of any exudate or lesions.Normal dentition.  Neck: normal, supple, no masses, no thyromegaly Respiratory: clear to auscultation bilaterally, no wheezing, no crackles. Normal  respiratory effort. No accessory muscle use.  Cardiovascular: Regular rate and rhythm, no murmurs / rubs / gallops. No extremity edema. 2+ pedal pulses. No carotid bruits.  Abdomen: no tenderness, no masses palpated. No hepatosplenomegaly. Bowel sounds positive.  Musculoskeletal: no clubbing / cyanosis. No joint deformity upper and lower extremities. Good ROM, no contractures. Normal muscle tone.  Skin: no rashes, lesions, ulcers. No induration Neurologic: CN 2-12 grossly intact. Sensation intact, DTR normal. Strength 5/5 in all 4.  Psychiatric: Normal judgment and insight. Alert and oriented x 3. Normal mood.    Labs on Admission: I have personally reviewed following labs and imaging studies  CBC: Recent Labs  Lab 12/26/19 0611 12/26/19 2203  WBC 9.9 13.8*  NEUTROABS 7.5 11.7*  HGB 14.3 14.4  HCT 42.1 43.1  MCV 90.3 91.3  PLT 189 867   Basic Metabolic Panel: Recent Labs  Lab 12/26/19 0611 12/26/19 2203  NA 138 141  K 3.4* 3.5  CL 103 104  CO2 24 23  GLUCOSE 117* 135*  BUN 9 7*  CREATININE 0.66 0.63  CALCIUM 9.0 9.1   GFR: Estimated Creatinine Clearance: 73.2 mL/min (by C-G formula based on SCr of 0.63 mg/dL). Liver Function Tests: Recent Labs  Lab 12/26/19 0611 12/26/19 2203  AST 22 19  ALT 20 18  ALKPHOS 64 68  BILITOT 0.6 0.9  PROT 7.6 7.7  ALBUMIN 4.4 4.3   Recent Labs  Lab 12/26/19 0611  LIPASE 30   No results for input(s): AMMONIA in the last 168 hours. Coagulation Profile: No results for input(s): INR, PROTIME in the last 168 hours. Cardiac Enzymes: No results for input(s): CKTOTAL, CKMB, CKMBINDEX, TROPONINI in the last 168 hours. BNP (last 3 results) No results for input(s): PROBNP in the last 8760 hours. HbA1C: No results for input(s): HGBA1C in the last 72 hours. CBG: No results for input(s): GLUCAP in the last 168 hours. Lipid Profile: No results for input(s): CHOL, HDL, LDLCALC, TRIG, CHOLHDL, LDLDIRECT in the last 72 hours. Thyroid  Function Tests: No results for input(s): TSH, T4TOTAL, FREET4, T3FREE, THYROIDAB in the last 72 hours. Anemia Panel: No results for input(s): VITAMINB12, FOLATE, FERRITIN, TIBC, IRON, RETICCTPCT in the last 72 hours. Urine analysis:    Component Value Date/Time   COLORURINE STRAW (A) 12/26/2019 0715   APPEARANCEUR CLEAR 12/26/2019 0715   LABSPEC 1.026 12/26/2019 0715   PHURINE 6.0 12/26/2019 0715   GLUCOSEU NEGATIVE 12/26/2019 0715   HGBUR NEGATIVE 12/26/2019 0715   BILIRUBINUR NEGATIVE 12/26/2019 0715   KETONESUR 20 (A) 12/26/2019 0715   PROTEINUR NEGATIVE 12/26/2019 0715   NITRITE NEGATIVE 12/26/2019 0715   LEUKOCYTESUR NEGATIVE 12/26/2019 0715    Radiological Exams on Admission: CT ABDOMEN PELVIS W CONTRAST  Result  Date: 12/26/2019 CLINICAL DATA:  Abdominal distension, abdominal pain EXAM: CT ABDOMEN AND PELVIS WITH CONTRAST TECHNIQUE: Multidetector CT imaging of the abdomen and pelvis was performed using the standard protocol following bolus administration of intravenous contrast. CONTRAST:  119mL OMNIPAQUE IOHEXOL 300 MG/ML  SOLN COMPARISON:  None. FINDINGS: Lower chest: No acute abnormality. Hepatobiliary: No focal liver abnormality is seen. No gallstones, gallbladder wall thickening, or biliary dilatation. Pancreas: Unremarkable. Spleen: Unremarkable. Adrenals/Urinary Tract: Adrenals, kidneys, and bladder unremarkable. Stomach/Bowel: Stomach is within normal limits apart from a small hiatal hernia. Bowel is normal in caliber. There is wall thickening involving the cecum, ascending, and transverse colon. There is some thickening of the ileocecal junction. Surrounding fat infiltration is present. Normal appendix. Vascular/Lymphatic: Minor aortic atherosclerosis. No enlarged lymph nodes identified. Reproductive: Fibroid uterus.  No adnexal mass. Other: No ascites.  Abdominal wall is unremarkable. Musculoskeletal: Degenerative changes of the included spine. No acute osseous abnormality.  IMPRESSION: Right and transverse colonic wall thickening is likely infectious/inflammatory in etiology. There is also some involvement of ileocecal junction. Fibroid uterus. Electronically Signed   By: Macy Mis M.D.   On: 12/26/2019 07:09    EKG: Independently reviewed.  Assessment/Plan Principal Problem:   Colitis presumed infectious Active Problems:   HTN (hypertension)   BRBPR (bright red blood per rectum)    1. Colitis presumed infectious - 1. Cont empiric cipro/flagyl 2. GI pathogen pnl ordered and pending 3. IVF: lr AT 100 4. IV morphine PRN pain control 2. BRBPR - 1. Secondary to above 2. Tele monitor 3. Repeat CBC in AM 3. HTN - 1. Med rec pending 2. Cont BP meds except diuretics  DVT prophylaxis: SCDs Code Status: Full Family Communication: No family in room Disposition Plan: Home after pain control and HGB stable Consults called: None Admission status: Place in obs    Carolee Channell, [place] [facility]  How to contact the [facility] Attending or Consulting provider [place] or covering provider during after hours [place], for this patient?  1. Check the care team in [facility] and look for a) attending/consulting [facility] provider listed and b) the [facility] team listed 2. Log into [email or web address]  Amion Physician Scheduling and messaging for groups and whole hospitals  On call and physician scheduling software for group practices, residents, hospitalists and other medical providers for call, clinic, rotation and shift schedules. On[employer] is a hospital-wide system for scheduling doctors and paging doctors on call. EasyPlot is for scientific plotting and data analysis.  [email or web address]  and use [facility]'s universal password to access. If you do not have the password, please contact the hospital operator.  3. Locate the [facility] provider you are looking for under [facility] and page to a number that you can be directly reached. 4. If you still have difficulty reaching  the provider, please page the [facility] (Director on Call) for the Hospitalists listed on amion for assistance.  12/26/2019, 11:30 PM

## 2019-12-26 NOTE — ED Provider Notes (Signed)
7:25 AM-checkout from Dr. Betsey Holiday to evaluate patient following return of CT imaging for evaluation of diarrhea with bleeding.  CT ABDOMEN PELVIS W CONTRAST  Result Date: 12/26/2019 CLINICAL DATA:  Abdominal distension, abdominal pain EXAM: CT ABDOMEN AND PELVIS WITH CONTRAST TECHNIQUE: Multidetector CT imaging of the abdomen and pelvis was performed using the standard protocol following bolus administration of intravenous contrast. CONTRAST:  163mL OMNIPAQUE IOHEXOL 300 MG/ML  SOLN COMPARISON:  None. FINDINGS: Lower chest: No acute abnormality. Hepatobiliary: No focal liver abnormality is seen. No gallstones, gallbladder wall thickening, or biliary dilatation. Pancreas: Unremarkable. Spleen: Unremarkable. Adrenals/Urinary Tract: Adrenals, kidneys, and bladder unremarkable. Stomach/Bowel: Stomach is within normal limits apart from a small hiatal hernia. Bowel is normal in caliber. There is wall thickening involving the cecum, ascending, and transverse colon. There is some thickening of the ileocecal junction. Surrounding fat infiltration is present. Normal appendix. Vascular/Lymphatic: Minor aortic atherosclerosis. No enlarged lymph nodes identified. Reproductive: Fibroid uterus.  No adnexal mass. Other: No ascites.  Abdominal wall is unremarkable. Musculoskeletal: Degenerative changes of the included spine. No acute osseous abnormality. IMPRESSION: Right and transverse colonic wall thickening is likely infectious/inflammatory in etiology. There is also some involvement of ileocecal junction. Fibroid uterus. Electronically Signed   By: Macy Mis M.D.   On: 12/26/2019 07:09    7:55 AM-patient is comfortable, vital signs are reassuring. Findings discussed with the patient all questions were answered. Prescriptions sent for Flagyl and Cipro, advised on low fiber diet and referred to GI.    Daleen Bo, MD 12/26/19 [phone]

## 2019-12-26 NOTE — Telephone Encounter (Signed)
Patient called in stating that her pharmacy will not have the cipro until Monday, can we switch it to different pharmacy that has it? Called [facility] at [address], they moved it over to fill there. Called patient back and told her of this. She will pick it up at that location.

## 2019-12-26 NOTE — Discharge Instructions (Signed)
The discomfort and diarrhea with bleeding which you are having is likely secondary to colitis. This can be either inflammation or infection. We are prescribing you antibiotics to help control the symptoms, and recommend staying on a low fiber diet for now. Please call the on-call gastroenterologist for an appointment to be seen for further evaluation and treatment. Make sure you are getting plenty of rest and drinking a lot of fluids.

## 2019-12-26 NOTE — ED Triage Notes (Signed)
Pt seen this morning for lower abdominal pain and diarrhea. Was given abx for inflammation. Pt says she is taking the rx as prescribed but pain remains intense.

## 2019-12-26 NOTE — Progress Notes (Signed)
Pharmacy Antibiotic Note  Jamie Kelley is a 64 y.o. female admitted on 12/26/2019 with colitis.  Pharmacy has been consulted for Cipro dosing.  Plan: Cipro 400mg  IV q12h Metronidazole per MD Need for further dosage adjustment appears unlikely at present.    Will sign off at this time.  Please reconsult if a change in clinical status warrants re-evaluation of dosage.   Height: 5' (152.4 cm) Weight: 90.7 kg (200 lb) IBW/kg (Calculated) : 45.5  Temp (24hrs), Avg:98.5 F (36.9 C), Min:98 F (36.7 C), Max:98.9 F (37.2 C)  Recent Labs  Lab 12/26/19 0611 12/26/19 2203  WBC 9.9 13.8*  CREATININE 0.66 0.63  LATICACIDVEN  --  1.0    Estimated Creatinine Clearance: 73.2 mL/min (by C-G formula based on SCr of 0.63 mg/dL).    No Known Allergies  Thank you for allowing pharmacy to be a part of this patient's care.  Everette Rank, PharmD 12/26/2019 11:10 PM

## 2019-12-26 NOTE — ED Triage Notes (Signed)
Pt sts diarrhea x 2 days associated with lower abdominal pain.

## 2019-12-26 NOTE — ED Notes (Signed)
Pt report diarrhea x 3 days.  PT states that she had 4 BMs in the last 24 hours.  Pt also reports abdominal tenderness.  NADN.  Will continue to monitor.

## 2019-12-26 NOTE — ED Provider Notes (Signed)
[facility] DEPT Provider Note   CSN: [id number] Arrival date & time: 12/26/19  1927     History Chief Complaint  Patient presents with  . Abdominal Pain    Jamie Kelley is a 64 y.o. female hx of HTN, HL, here with abdominal pain, rectal bleeding.  Patient came in yesterday for abdominal pain and cramping and diarrhea.  Patient had a CT abdomen pelvis that showed possible colitis.  Patient was sent home with Cipro and Flagyl.  Patient states that since discharge, she has persistent abdominal cramps.  She states that she had some bright red blood per rectum with clots since then.  She states that she at least has 3-4 episodes of bright red blood and has worsening cramps. Had vomiting as well but no fever. Not on blood thinner.   The history is provided by the patient.       Past Medical History:  Diagnosis Date  . Hay fever   . High blood pressure   . Hyperlipidemia   . Obesity   . Thyroid disease     Patient Active Problem List   Diagnosis Date Noted  . Morbid obesity ([place]) 12/10/2019  . 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 Physical exam 06/05/2019  . Vitamin D deficiency 06/05/2019  . Depression 12/25/2017  . Contact dermatitis 07/06/2015  . Thyromegaly 10/22/2014  . Hyperlipidemia 10/22/2014  . HTN (hypertension) 05/29/2012  . Hypothyroidism 05/29/2012  . Arthritis of both knees 05/29/2012    Past Surgical History:  Procedure Laterality Date  . BIOPSY THYROID     Dr Constance Holster- negative for cancer     OB History   No obstetric history on file.     Family History  Problem Relation Age of Onset  . Heart disease Mother   . Hypertension Mother   . Diabetes Mother   . Alzheimer's disease Mother   . Cancer Maternal Aunt        breast  . Heart disease Maternal Aunt   . Hypertension Maternal Aunt   . Breast cancer Maternal Aunt 70  . Hypertension Maternal Uncle   . Heart disease Maternal Uncle    . Heart disease Maternal Grandmother   . Hypertension Maternal Grandmother   . Diabetes Maternal Grandmother     Social History   Tobacco Use  . Smoking status: Never Smoker  . Smokeless tobacco: Never Used  Vaping Use  . Vaping Use: Never used  Substance Use Topics  . Alcohol use: No  . Drug use: No    Home Medications Prior to Admission medications   Medication Sig Start Date End Date Taking? Authorizing Provider  albuterol (VENTOLIN HFA) 108 (90 Base) MCG/ACT inhaler Inhale 1-2 puffs into the lungs every 4 (four) hours as needed for wheezing or shortness of breath. 11/17/19   Betancourt, Aura Fey, NP  amLODipine (NORVASC) 10 MG tablet Take 1 tablet (10 mg total) by mouth daily. 06/05/19   Midge Minium, MD  atenolol (TENORMIN) 50 MG tablet Take 2 tablets (100 mg total) by mouth daily. 06/05/19   Midge Minium, MD  azelastine (OPTIVAR) 0.05 % ophthalmic solution  06/09/18   [provider]  ciprofloxacin (CIPRO) 500 MG tablet Take 1 tablet (500 mg total) by mouth 2 (two) times daily. One po bid x 7 days 12/26/19   Daleen Bo, MD  fluticasone [facility]) 50 MCG/ACT nasal spray Place 1 spray into both nostrils 2 (two) times daily. 12/18/16 10/21/18  Betancourt, Tina A, NP  hydrochlorothiazide (HYDRODIURIL) 25 MG tablet Take 1 tablet (25 mg total) by mouth daily. 12/04/18   Midge Minium, MD  levothyroxine (SYNTHROID) 50 MCG tablet TAKE 1/2 (ONE-HALF) TABLET BY MOUTH ONCE DAILY BEFORE BREAKFAST 06/05/19   Midge Minium, MD  metroNIDAZOLE (FLAGYL) 500 MG tablet Take 1 tablet (500 mg total) by mouth 2 (two) times daily. One po bid x 7 days 12/26/19   Daleen Bo, MD  simvastatin (ZOCOR) 20 MG tablet Take 1 tablet (20 mg total) by mouth at bedtime. 06/05/19   Midge Minium, MD  sodium chloride (OCEAN) 0.65 % SOLN nasal spray Place 2 sprays into both nostrils every 2 (two) hours while awake for 30 days. 04/03/18 10/21/18  BetancourtAura Fey, NP    Allergies     Patient has no known allergies.  Review of Systems   Review of Systems  Gastrointestinal: Positive for abdominal pain, blood in stool and diarrhea.  All other systems reviewed and are negative.   Physical Exam Updated Vital Signs BP (!) 162/94 (BP Location: Right Arm)   Pulse 81   Temp 98.9 F (37.2 C) (Oral)   Resp 16   Ht 5' (1.524 m)   Wt 90.7 kg   LMP 03/10/2012   SpO2 98%   BMI 39.06 kg/m   Physical Exam Vitals and nursing note reviewed.  Constitutional:      Comments: Uncomfortable,  HENT:     Head: Normocephalic.     Mouth/Throat:     Pharynx: Oropharynx is clear.     Comments: MM dry  Eyes:     Extraocular Movements: Extraocular movements intact.  Cardiovascular:     Rate and Rhythm: Normal rate and regular rhythm.     Heart sounds: Normal heart sounds.  Pulmonary:     Effort: Pulmonary effort is normal.     Breath sounds: Normal breath sounds.  Abdominal:     General: Abdomen is flat.     Palpations: Abdomen is soft.     Comments: Mild diffuse lower abdominal tenderness, no rebound   Genitourinary:    Comments: Rectal- + dark stool  Skin:    General: Skin is warm.     Capillary Refill: Capillary refill takes less than 2 seconds.  Neurological:     General: No focal deficit present.     Mental Status: She is oriented to person, place, and time.  Psychiatric:        Mood and Affect: Mood normal.        Behavior: Behavior normal.     ED Results / Procedures / Treatments   Labs (all labs ordered are listed, but only abnormal results are displayed) Labs Reviewed  POC OCCULT BLOOD, ED - Abnormal; Notable for the following components:      Result Value   Fecal Occult Bld POSITIVE (*)    All other components within normal limits  CULTURE, BLOOD (ROUTINE X 2)  CULTURE, BLOOD (ROUTINE X 2)  CBC WITH DIFFERENTIAL/PLATELET  COMPREHENSIVE METABOLIC PANEL  OCCULT BLOOD GASTRIC / DUODENUM (SPECIMEN CUP)  LACTIC ACID, PLASMA  LACTIC ACID, PLASMA   TYPE AND SCREEN    EKG None  Radiology CT ABDOMEN PELVIS W CONTRAST  Result Date: 12/26/2019 CLINICAL DATA:  Abdominal distension, abdominal pain EXAM: CT ABDOMEN AND PELVIS WITH CONTRAST TECHNIQUE: Multidetector CT imaging of the abdomen and pelvis was performed using the standard protocol following bolus administration of intravenous contrast. CONTRAST:  167mL OMNIPAQUE IOHEXOL 300 MG/ML  SOLN COMPARISON:  None. FINDINGS: Lower chest: No acute abnormality. Hepatobiliary: No focal liver abnormality is seen. No gallstones, gallbladder wall thickening, or biliary dilatation. Pancreas: Unremarkable. Spleen: Unremarkable. Adrenals/Urinary Tract: Adrenals, kidneys, and bladder unremarkable. Stomach/Bowel: Stomach is within normal limits apart from a small hiatal hernia. Bowel is normal in caliber. There is wall thickening involving the cecum, ascending, and transverse colon. There is some thickening of the ileocecal junction. Surrounding fat infiltration is present. Normal appendix. Vascular/Lymphatic: Minor aortic atherosclerosis. No enlarged lymph nodes identified. Reproductive: Fibroid uterus.  No adnexal mass. Other: No ascites.  Abdominal wall is unremarkable. Musculoskeletal: Degenerative changes of the included spine. No acute osseous abnormality. IMPRESSION: Right and transverse colonic wall thickening is likely infectious/inflammatory in etiology. There is also some involvement of ileocecal junction. Fibroid uterus. Electronically Signed   By: Macy Mis M.D.   On: 12/26/2019 07:09    Procedures Procedures (including critical care time)  Medications Ordered in ED Medications  sodium chloride 0.9 % bolus 1,000 mL (has no administration in time range)  morphine 4 MG/ML injection 4 mg (has no administration in time range)  ondansetron (ZOFRAN) injection 4 mg (has no administration in time range)  ciprofloxacin (CIPRO) IVPB 400 mg (has no administration in time range)  metroNIDAZOLE  (FLAGYL) IVPB 500 mg (has no administration in time range)    ED Course  I have reviewed the triage vital signs and the nursing notes.  Pertinent labs & imaging results that were available during my care of the patient were reviewed by me and considered in my medical decision making (see chart for details).    MDM Rules/Calculators/A&P                         Jamie Kelley is a 64 y.o. female here presenting with abdominal pain and blood in stool.  Patient had colitis on CT earlier in the day. Patient has bright red blood per rectum now.  Bleeding likely from colitis versus diverticular bleed.  Given that she has persistent bleeding and pain, will likely need GI eval and admission.  10:55 PM Blood cell count is elevated at 14.  Hemoglobin stable at 14.  Patient has gross blood on rectal exam. Messaged Dr. Carlean Purl from GI (PCP is Dr. Birdie Riddle from [facility] and she had previous colonoscopy before but doesn't know who her GI doctor is) to see patient as consult in the morning.  Hospitalist to admit.    Final Clinical Impression(s) / ED Diagnoses Final diagnoses:  None    Rx / DC Orders ED Discharge Orders    None       Drenda Freeze, MD 12/26/19 [phone]

## 2019-12-26 NOTE — ED Provider Notes (Signed)
[facility] DEPT Provider Note   CSN: [id number] Arrival date & time: 12/26/19  3419     History Chief Complaint  Patient presents with  . Diarrhea    Jamie Kelley is a 64 y.o. female.  Patient reports that she has been experiencing diffuse abdominal cramping, bloating and diarrhea for 2 days.  No fever, nausea or vomiting.  She has noticed some red blood in the diarrhea.  She denies any history of diverticulitis or colitis.  Patient reports that over the last few weeks she has changed her diet significantly, drinking a lot of smoothies and eating salads.  She wonders if this could have caused her symptoms.        Past Medical History:  Diagnosis Date  . Hay fever   . High blood pressure   . Hyperlipidemia   . Obesity   . Thyroid disease     Patient Active Problem List   Diagnosis Date Noted  . Morbid obesity ([place]) 12/10/2019  . 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 Physical exam 06/05/2019  . Vitamin D deficiency 06/05/2019  . Depression 12/25/2017  . Contact dermatitis 07/06/2015  . Thyromegaly 10/22/2014  . Hyperlipidemia 10/22/2014  . HTN (hypertension) 05/29/2012  . Hypothyroidism 05/29/2012  . Arthritis of both knees 05/29/2012    Past Surgical History:  Procedure Laterality Date  . BIOPSY THYROID     Dr Constance Holster- negative for cancer     OB History   No obstetric history on file.     Family History  Problem Relation Age of Onset  . Heart disease Mother   . Hypertension Mother   . Diabetes Mother   . Alzheimer's disease Mother   . Cancer Maternal Aunt        breast  . Heart disease Maternal Aunt   . Hypertension Maternal Aunt   . Breast cancer Maternal Aunt 70  . Hypertension Maternal Uncle   . Heart disease Maternal Uncle   . Heart disease Maternal Grandmother   . Hypertension Maternal Grandmother   . Diabetes Maternal Grandmother     Social History   Tobacco Use  . Smoking  status: Never Smoker  . Smokeless tobacco: Never Used  Vaping Use  . Vaping Use: Never used  Substance Use Topics  . Alcohol use: No  . Drug use: No    Home Medications Prior to Admission medications   Medication Sig Start Date End Date Taking? Authorizing Provider  albuterol (VENTOLIN HFA) 108 (90 Base) MCG/ACT inhaler Inhale 1-2 puffs into the lungs every 4 (four) hours as needed for wheezing or shortness of breath. 11/17/19   Betancourt, Aura Fey, NP  amLODipine (NORVASC) 10 MG tablet Take 1 tablet (10 mg total) by mouth daily. 06/05/19   Midge Minium, MD  atenolol (TENORMIN) 50 MG tablet Take 2 tablets (100 mg total) by mouth daily. 06/05/19   Midge Minium, MD  azelastine (OPTIVAR) 0.05 % ophthalmic solution  06/09/18   [provider]  fluticasone (FLONASE) 50 MCG/ACT nasal spray Place 1 spray into both nostrils 2 (two) times daily. 12/18/16 10/21/18  Betancourt, Aura Fey, NP  hydrochlorothiazide (HYDRODIURIL) 25 MG tablet Take 1 tablet (25 mg total) by mouth daily. 12/04/18   Midge Minium, MD  levothyroxine (SYNTHROID) 50 MCG tablet TAKE 1/2 (ONE-HALF) TABLET BY MOUTH ONCE DAILY BEFORE BREAKFAST 06/05/19   Midge Minium, MD  simvastatin (ZOCOR) 20 MG tablet Take 1 tablet (20 mg total)  by mouth at bedtime. 06/05/19   Midge Minium, MD  sodium chloride (OCEAN) 0.65 % SOLN nasal spray Place 2 sprays into both nostrils every 2 (two) hours while awake for 30 days. 04/03/18 10/21/18  BetancourtAura Fey, NP    Allergies    Patient has no known allergies.  Review of Systems   Review of Systems  Gastrointestinal: Positive for abdominal distention, abdominal pain and diarrhea.  All other systems reviewed and are negative.   Physical Exam Updated Vital Signs BP (!) 164/91 (BP Location: Right Arm)   Pulse 77   Temp 98 F (36.7 C) (Oral)   Resp 20   Ht 5' (1.524 m)   Wt 90.7 kg   LMP 03/10/2012   SpO2 96%   BMI 39.06 kg/m   Physical Exam Vitals and  nursing note reviewed.  Constitutional:      General: She is not in acute distress.    Appearance: Normal appearance. She is well-developed.  HENT:     Head: Normocephalic and atraumatic.     Right Ear: Hearing normal.     Left Ear: Hearing normal.     Nose: Nose normal.  Eyes:     Conjunctiva/sclera: Conjunctivae normal.     Pupils: Pupils are equal, round, and reactive to light.  Cardiovascular:     Rate and Rhythm: Regular rhythm.     Heart sounds: S1 normal and S2 normal. No murmur heard.  No friction rub. No gallop.   Pulmonary:     Effort: Pulmonary effort is normal. No respiratory distress.     Breath sounds: Normal breath sounds.  Chest:     Chest wall: No tenderness.  Abdominal:     General: Bowel sounds are normal. There is distension.     Palpations: Abdomen is soft.     Tenderness: There is generalized abdominal tenderness. There is no guarding or rebound. Negative signs include Murphy's sign and McBurney's sign.     Hernia: No hernia is present.  Musculoskeletal:        General: Normal range of motion.     Cervical back: Normal range of motion and neck supple.  Skin:    General: Skin is warm and dry.     Findings: No rash.  Neurological:     Mental Status: She is alert and oriented to person, place, and time.     GCS: GCS eye subscore is 4. GCS verbal subscore is 5. GCS motor subscore is 6.     Cranial Nerves: No cranial nerve deficit.     Sensory: No sensory deficit.     Coordination: Coordination normal.  Psychiatric:        Speech: Speech normal.        Behavior: Behavior normal.        Thought Content: Thought content normal.     ED Results / Procedures / Treatments   Labs (all labs ordered are listed, but only abnormal results are displayed) Labs Reviewed  CBC WITH DIFFERENTIAL/PLATELET  COMPREHENSIVE METABOLIC PANEL  LIPASE, BLOOD  URINALYSIS, ROUTINE W REFLEX MICROSCOPIC    EKG None  Radiology No results found.  Procedures Procedures  (including critical care time)  Medications Ordered in ED Medications  sodium chloride (PF) 0.9 % injection (has no administration in time range)  sodium chloride 0.9 % bolus 500 mL (500 mLs Intravenous New Bag/Given 12/26/19 0612)  iohexol (OMNIPAQUE) 300 MG/ML solution 100 mL (100 mLs Intravenous Contrast Given 12/26/19 0647)    ED Course  I have reviewed the triage vital signs and the nursing notes.  Pertinent labs & imaging results that were available during my care of the patient were reviewed by me and considered in my medical decision making (see chart for details).    MDM Rules/Calculators/A&P                          Patient presents to the emergency department for evaluation of abdominal pain with diarrhea.  Patient has not had any nausea or vomiting.  She denies fever.  Patient denies previous history of colitis/diverticulitis.  She does report that she has noticed some blood in her stools.  She is unaware of any history of hemorrhoids.  Examination revealed mild distention and diffuse tenderness without guarding or rebound.  Patient has a normal white blood cell count and hemoglobin.  Will perform CT scan to further evaluate.  Signout oncoming ER physician to follow-up.   Final Clinical Impression(s) / ED Diagnoses Final diagnoses:  Diarrhea, unspecified type  Generalized abdominal pain    Rx / DC Orders ED Discharge Orders    None       Orpah Greek, MD 12/26/19 [phone]

## 2019-12-27 ENCOUNTER — Encounter (HOSPITAL_COMMUNITY): Payer: Self-pay | Admitting: Internal Medicine

## 2019-12-27 DIAGNOSIS — K625 Hemorrhage of anus and rectum: Secondary | ICD-10-CM

## 2019-12-27 DIAGNOSIS — A040 Enteropathogenic Escherichia coli infection: Secondary | ICD-10-CM | POA: Diagnosis present

## 2019-12-27 DIAGNOSIS — R7303 Prediabetes: Secondary | ICD-10-CM | POA: Diagnosis present

## 2019-12-27 DIAGNOSIS — I361 Nonrheumatic tricuspid (valve) insufficiency: Secondary | ICD-10-CM | POA: Diagnosis not present

## 2019-12-27 DIAGNOSIS — E876 Hypokalemia: Secondary | ICD-10-CM | POA: Diagnosis present

## 2019-12-27 DIAGNOSIS — E669 Obesity, unspecified: Secondary | ICD-10-CM | POA: Diagnosis present

## 2019-12-27 DIAGNOSIS — D649 Anemia, unspecified: Secondary | ICD-10-CM | POA: Diagnosis present

## 2019-12-27 DIAGNOSIS — I2693 Single subsegmental pulmonary embolism without acute cor pulmonale: Secondary | ICD-10-CM | POA: Diagnosis present

## 2019-12-27 DIAGNOSIS — K529 Noninfective gastroenteritis and colitis, unspecified: Secondary | ICD-10-CM | POA: Diagnosis not present

## 2019-12-27 DIAGNOSIS — E042 Nontoxic multinodular goiter: Secondary | ICD-10-CM | POA: Diagnosis present

## 2019-12-27 DIAGNOSIS — R6520 Severe sepsis without septic shock: Secondary | ICD-10-CM | POA: Diagnosis present

## 2019-12-27 DIAGNOSIS — R109 Unspecified abdominal pain: Secondary | ICD-10-CM | POA: Diagnosis present

## 2019-12-27 DIAGNOSIS — Z6839 Body mass index (BMI) 39.0-39.9, adult: Secondary | ICD-10-CM | POA: Diagnosis not present

## 2019-12-27 DIAGNOSIS — E871 Hypo-osmolality and hyponatremia: Secondary | ICD-10-CM | POA: Diagnosis present

## 2019-12-27 DIAGNOSIS — J9601 Acute respiratory failure with hypoxia: Secondary | ICD-10-CM | POA: Diagnosis present

## 2019-12-27 DIAGNOSIS — E785 Hyperlipidemia, unspecified: Secondary | ICD-10-CM | POA: Diagnosis present

## 2019-12-27 DIAGNOSIS — I2699 Other pulmonary embolism without acute cor pulmonale: Secondary | ICD-10-CM | POA: Diagnosis not present

## 2019-12-27 DIAGNOSIS — R739 Hyperglycemia, unspecified: Secondary | ICD-10-CM | POA: Diagnosis present

## 2019-12-27 DIAGNOSIS — J9811 Atelectasis: Secondary | ICD-10-CM | POA: Diagnosis present

## 2019-12-27 DIAGNOSIS — J189 Pneumonia, unspecified organism: Secondary | ICD-10-CM | POA: Diagnosis present

## 2019-12-27 DIAGNOSIS — I10 Essential (primary) hypertension: Secondary | ICD-10-CM | POA: Diagnosis present

## 2019-12-27 DIAGNOSIS — Z20822 Contact with and (suspected) exposure to covid-19: Secondary | ICD-10-CM | POA: Diagnosis present

## 2019-12-27 DIAGNOSIS — A419 Sepsis, unspecified organism: Secondary | ICD-10-CM | POA: Diagnosis present

## 2019-12-27 LAB — ABO/RH: ABO/RH(D): AB POS

## 2019-12-27 LAB — CBC WITH DIFFERENTIAL/PLATELET
Abs Immature Granulocytes: 0.15 10*3/uL — ABNORMAL HIGH (ref 0.00–0.07)
Basophils Absolute: 0 10*3/uL (ref 0.0–0.1)
Basophils Relative: 0 %
Eosinophils Absolute: 0 10*3/uL (ref 0.0–0.5)
Eosinophils Relative: 0 %
HCT: 38.9 % (ref 36.0–46.0)
Hemoglobin: 12.9 g/dL (ref 12.0–15.0)
Immature Granulocytes: 1 %
Lymphocytes Relative: 11 %
Lymphs Abs: 1.4 10*3/uL (ref 0.7–4.0)
MCH: 30.5 pg (ref 26.0–34.0)
MCHC: 33.2 g/dL (ref 30.0–36.0)
MCV: 92 fL (ref 80.0–100.0)
Monocytes Absolute: 1.1 10*3/uL — ABNORMAL HIGH (ref 0.1–1.0)
Monocytes Relative: 8 %
Neutro Abs: 10.9 10*3/uL — ABNORMAL HIGH (ref 1.7–7.7)
Neutrophils Relative %: 80 %
Platelets: 161 10*3/uL (ref 150–400)
RBC: 4.23 MIL/uL (ref 3.87–5.11)
RDW: 14.1 % (ref 11.5–15.5)
WBC: 13.6 10*3/uL — ABNORMAL HIGH (ref 4.0–10.5)
nRBC: 0 % (ref 0.0–0.2)

## 2019-12-27 LAB — BASIC METABOLIC PANEL
Anion gap: 9 (ref 5–15)
BUN: 5 mg/dL — ABNORMAL LOW (ref 8–23)
CO2: 24 mmol/L (ref 22–32)
Calcium: 8.2 mg/dL — ABNORMAL LOW (ref 8.9–10.3)
Chloride: 104 mmol/L (ref 98–111)
Creatinine, Ser: 0.52 mg/dL (ref 0.44–1.00)
GFR, Estimated: >60 mL/min (ref >60)
Glucose, Bld: 130 mg/dL — ABNORMAL HIGH (ref 70–99)
Potassium: 3.1 mmol/L — ABNORMAL LOW (ref 3.5–5.1)
Sodium: 137 mmol/L (ref 135–145)

## 2019-12-27 LAB — HIV ANTIBODY (ROUTINE TESTING W REFLEX): HIV Screen 4th Generation wRfx: NONREACTIVE

## 2019-12-27 LAB — TYPE AND SCREEN
ABO/RH(D): AB POS
Antibody Screen: NEGATIVE

## 2019-12-27 LAB — CBC
HCT: 39 % (ref 36.0–46.0)
Hemoglobin: 13.1 g/dL (ref 12.0–15.0)
MCH: 30.6 pg (ref 26.0–34.0)
MCHC: 33.6 g/dL (ref 30.0–36.0)
MCV: 91.1 fL (ref 80.0–100.0)
Platelets: 157 10*3/uL (ref 150–400)
RBC: 4.28 MIL/uL (ref 3.87–5.11)
RDW: 14.1 % (ref 11.5–15.5)
WBC: 13.7 10*3/uL — ABNORMAL HIGH (ref 4.0–10.5)
nRBC: 0 % (ref 0.0–0.2)

## 2019-12-27 LAB — C DIFFICILE QUICK SCREEN W PCR REFLEX
C Diff antigen: NEGATIVE
C Diff interpretation: NOT DETECTED
C Diff toxin: NEGATIVE

## 2019-12-27 LAB — SEDIMENTATION RATE: Sed Rate: 20 mm/hr (ref 0–22)

## 2019-12-27 LAB — RESPIRATORY PANEL BY RT PCR (FLU A&B, COVID)
Influenza A by PCR: NEGATIVE
Influenza B by PCR: NEGATIVE
SARS Coronavirus 2 by RT PCR: NEGATIVE

## 2019-12-27 LAB — LACTIC ACID, PLASMA: Lactic Acid, Venous: 1 mmol/L (ref 0.5–1.9)

## 2019-12-27 MED ORDER — POTASSIUM CHLORIDE CRYS ER 20 MEQ PO TBCR
40.0000 meq | EXTENDED_RELEASE_TABLET | ORAL | Status: AC
  Administered 2019-12-27 (×2): 40 meq via ORAL
  Filled 2019-12-27 (×2): qty 2

## 2019-12-27 MED ORDER — OXYCODONE HCL 5 MG PO TABS
5.0000 mg | ORAL_TABLET | ORAL | Status: DC | PRN
  Administered 2019-12-27 – 2020-01-04 (×25): 5 mg via ORAL
  Filled 2019-12-27 (×26): qty 1

## 2019-12-27 MED ORDER — POTASSIUM CHLORIDE 20 MEQ/15ML (10%) PO SOLN
40.0000 meq | ORAL | Status: DC
  Administered 2019-12-27: 40 meq via ORAL
  Filled 2019-12-27: qty 30

## 2019-12-27 MED ORDER — DICYCLOMINE HCL 10 MG PO CAPS
10.0000 mg | ORAL_CAPSULE | Freq: Four times a day (QID) | ORAL | Status: DC | PRN
  Administered 2019-12-27 – 2019-12-30 (×6): 10 mg via ORAL
  Filled 2019-12-27 (×6): qty 1

## 2019-12-27 NOTE — Plan of Care (Signed)

## 2019-12-27 NOTE — Consult Note (Addendum)
Consultation  Referring Provider:  [facility] Primary Care Physician:  Midge Minium, MD Primary Gastroenterologist:  none Reason for Consultation:   Acute colitis  HPI: Jamie Kelley is a 64 y.o. female, Admitted last evening with 2-day history of acute abdominal pain described as fairly diffuse by the patient.  She said at onset she had diarrhea with 4-5 bowel movements, then had onset of abdominal pain which has been constant since.  Eventually started noticing bloody stools.  No documented fever or chills, she has had some nausea, no vomiting. She was seen in the emergency room yesterday, discharged on a course of Cipro and Flagyl, she came back due to severity of pain. No prior GI issues.  She says she believes she had a colonoscopy 10 to 15 years ago but does not recall who did that procedure. Had been feeling well up until acute onset of symptoms.  She had not been on any new medications, no recent antibiotics.  She said the only thing she was doing different was dieting and had been drinking a lot of smoothies.  She had not been using any dietary supplements etc. CT of the abdomen pelvis on 12/26/2019 showed right and transverse colonic wall thickening infectious versus inflammatory some thickening of the ileocecal junction and surrounding fat infiltration, minor aortic atherosclerosis, fibroid uterus. Labs show WBC of 13.8/hemoglobin 14/hematocrit of 43 LFTs within normal limits. Today WBC still 13.7, hemoglobin 13. No family history of IBD, or colon cancer as far she is aware.  She has been started on IV Cipro and Flagyl. GI path panel and C. difficile quick screen both pending.  She has not had any further diarrhea since arrival to the floor.  Past Medical History:  Diagnosis Date  . Arthritis   . Hay fever   . High blood pressure   . Hyperlipidemia   . Hypothyroidism   . Obesity   . Thyroid disease     Past Surgical History:  Procedure Laterality Date  . BIOPSY  THYROID     Dr Constance Holster- negative for cancer    Prior to Admission medications   Medication Sig Start Date End Date Taking? Authorizing Provider  albuterol (VENTOLIN HFA) 108 (90 Base) MCG/ACT inhaler Inhale 1-2 puffs into the lungs every 4 (four) hours as needed for wheezing or shortness of breath. 11/17/19  Yes Betancourt, Tina A, NP  amLODipine (NORVASC) 10 MG tablet Take 1 tablet (10 mg total) by mouth daily. 06/05/19  Yes Midge Minium, MD  atenolol (TENORMIN) 50 MG tablet Take 2 tablets (100 mg total) by mouth daily. 06/05/19  Yes Midge Minium, MD  ciprofloxacin (CIPRO) 500 MG tablet Take 1 tablet (500 mg total) by mouth 2 (two) times daily. One po bid x 7 days 12/26/19  Yes Daleen Bo, MD  fluticasone [facility]) 50 MCG/ACT nasal spray Place 1 spray into both nostrils 2 (two) times daily. 12/18/16 12/26/19 Yes Betancourt, Aura Fey, NP  hydrochlorothiazide (HYDRODIURIL) 25 MG tablet Take 1 tablet (25 mg total) by mouth daily. 12/04/18  Yes Midge Minium, MD  levothyroxine (SYNTHROID) 50 MCG tablet TAKE 1/2 (ONE-HALF) TABLET BY MOUTH ONCE DAILY BEFORE BREAKFAST 06/05/19  Yes Midge Minium, MD  metroNIDAZOLE (FLAGYL) 500 MG tablet Take 1 tablet (500 mg total) by mouth 2 (two) times daily. One po bid x 7 days 12/26/19  Yes Daleen Bo, MD  simvastatin (ZOCOR) 20 MG tablet Take 1 tablet (20 mg total) by mouth at bedtime. 06/05/19  Yes  Midge Minium, MD  levocetirizine (XYZAL) 5 MG tablet Take 1 tablet by mouth every evening. Patient not taking: Reported on 12/26/2019    [provider]  sodium chloride (OCEAN) 0.65 % SOLN nasal spray Place 2 sprays into both nostrils every 2 (two) hours while awake for 30 days. 04/03/18 10/21/18  BetancourtAura Fey, NP    Current Facility-Administered Medications  Medication Dose Route Frequency Provider Last Rate Last Admin  . acetaminophen (TYLENOL) tablet 650 mg  650 mg Oral Q6H PRN Etta Quill, DO       Or  .  acetaminophen (TYLENOL) suppository 650 mg  650 mg Rectal Q6H PRN Etta Quill, DO      . ciprofloxacin (CIPRO) IVPB 400 mg  400 mg Intravenous Q12H Poindexter, Leann T, RPH      . lactated ringers infusion   Intravenous Continuous Etta Quill, DO 100 mL/hr at 12/27/19 0144 New Bag at 12/27/19 0144  . metroNIDAZOLE (FLAGYL) IVPB 500 mg  500 mg Intravenous Q8H Etta Quill, DO 100 mL/hr at 12/27/19 0632 500 mg at 12/27/19 6144  . morphine 2 MG/ML injection 2-4 mg  2-4 mg Intravenous Q4H PRN Etta Quill, DO   2 mg at 12/27/19 0453  . ondansetron (ZOFRAN) tablet 4 mg  4 mg Oral Q6H PRN Etta Quill, DO   4 mg at 12/27/19 0451   Or  . ondansetron [facility]) injection 4 mg  4 mg Intravenous Q6H PRN Etta Quill, DO      . oxyCODONE (Oxy IR/ROXICODONE) immediate release tablet 5 mg  5 mg Oral Q4H PRN Caren Griffins, MD        Allergies as of 12/26/2019  . (No Known Allergies)    Family History  Problem Relation Age of Onset  . Heart disease Mother   . Hypertension Mother   . Diabetes Mother   . Alzheimer's disease Mother   . Cancer Maternal Aunt        breast  . Heart disease Maternal Aunt   . Hypertension Maternal Aunt   . Breast cancer Maternal Aunt 70  . Hypertension Maternal Uncle   . Heart disease Maternal Uncle   . Heart disease Maternal Grandmother   . Hypertension Maternal Grandmother   . Diabetes Maternal Grandmother     Social History   Socioeconomic History  . Marital status: Single    Spouse name: Not on file  . Number of children: Not on file  . Years of education: Not on file  . Highest education level: Not on file  Occupational History  . Not on file  Tobacco Use  . Smoking status: Never Smoker  . Smokeless tobacco: Never Used  Vaping Use  . Vaping Use: Never used  Substance and Sexual Activity  . Alcohol use: No  . Drug use: No  . Sexual activity: Not Currently  Other Topics Concern  . Not on file  Social History Narrative  .  Not on file   Social Determinants of Health   Financial Resource Strain:   . Difficulty of Paying Living Expenses: Not on file  Food Insecurity:   . Worried About Charity fundraiser in the Last Year: Not on file  . Ran Out of Food in the Last Year: Not on file  Transportation Needs:   . Lack of Transportation (Medical): Not on file  . Lack of Transportation (Non-Medical): Not on file  Physical Activity:   . Days of  Exercise per Week: Not on file  . Minutes of Exercise per Session: Not on file  Stress:   . Feeling of Stress : Not on file  Social Connections:   . Frequency of Communication with Friends and Family: Not on file  . Frequency of Social Gatherings with Friends and Family: Not on file  . Attends Religious Services: Not on file  . Active Member of Clubs or Organizations: Not on file  . Attends Archivist Meetings: Not on file  . Marital Status: Not on file  Intimate Partner Violence:   . Fear of Current or Ex-Partner: Not on file  . Emotionally Abused: Not on file  . Physically Abused: Not on file  . Sexually Abused: Not on file    Review of Systems: Pertinent positive and negative review of systems were noted in the above HPI section.  All other review of systems was otherwise negative.  Physical Exam: Vital signs in last 24 hours: Temp:  [98.8 F (37.1 C)-99.2 F (37.3 C)] 99.2 F (37.3 C) (10/31 0146) Pulse Rate:  [78-94] 83 (10/31 0628) Resp:  [16-20] 20 (10/31 0628) BP: (138-172)/(70-105) 149/76 (10/31 0628) SpO2:  [93 %-100 %] 94 % (10/31 0628) Weight:  [90.7 kg-91.7 kg] 91.7 kg (10/31 0137)   General:   Alert,  Well-developed, well-nourished, older African-American female pleasant and cooperative in NAD Head:  Normocephalic and atraumatic. Eyes:  Sclera clear, no icterus.   Conjunctiva pink. Ears:  Normal auditory acuity. Nose:  No deformity, discharge,  or lesions. Mouth:  No deformity or lesions.   Neck:  Supple; no masses or  thyromegaly. Lungs:  Clear throughout to auscultation.   No wheezes, crackles, or rhonchi. Heart:  Regular rate and rhythm; no murmurs, clicks, rubs,  or gallops. Abdomen:  Soft, she is tender across the lower abdomen, no rebound or guarding Rectal: not done Msk:  Symmetrical without gross deformities. . Pulses:  Normal pulses noted. Extremities:  Without clubbing or edema. Neurologic:  Alert and  oriented x4;  grossly normal neurologically. Skin:  Intact without significant lesions or rashes.. Psych:  Alert and cooperative. Normal mood and affect.   Lab Results: Recent Labs    12/26/19 0611 12/26/19 2203 12/27/19 0603  WBC 9.9 13.8* 13.7*  HGB 14.3 14.4 13.1  HCT 42.1 43.1 39.0  PLT 189 187 157   BMET Recent Labs    12/26/19 0611 12/26/19 2203 12/27/19 0603  NA 138 141 137  K 3.4* 3.5 3.1*  CL 103 104 104  CO2 24 23 24   GLUCOSE 117* 135* 130*  BUN 9 7* 5*  CREATININE 0.66 0.63 0.52  CALCIUM 9.0 9.1 8.2*   LFT Recent Labs    12/26/19 2203  PROT 7.7  ALBUMIN 4.3  AST 19  ALT 18  ALKPHOS 68  BILITOT 0.9     IMPRESSION:   #61 64 year old African-American female with acute right-sided colitis, onset of illness 3 days ago initially with diarrhea followed by abdominal pain and bloody diarrhea.  Associated leukocytosis, no fever. Mesenteric vessels read as patent on CT Suspect infectious, rule out ischemic doubt new onset IBD but cannot rule out  #2 hypertension #3.  Hyperlipidemia  Plan; clear liquids, advance to full liquids as tolerated Continue IV Cipro and IV Flagyl for now Await GI path panel and C. difficile quick screen IV analgesics as needed Add dicyclomine 10 mg every 6 hours as needed for abdominal pain/cramping. Will need eventual colonoscopy, if infectious work-up is negative and  symptoms persisting this may need to be done inpatient. Thank you GI will follow with you   Amy EsterwoodPA-C  12/27/2019, 9:00 AM  I have also evaluated the  patient and agree with Ms. Genia Harold assessment and plan.  The clinical scenario is very compatible with an infectious colitis.  She has required admission for pain control more than anything it seems.  Gatha Mayer, MD, [facility] 12/27/2019 2:33 PM

## 2019-12-27 NOTE — Progress Notes (Signed)
PROGRESS NOTE  Jamie Kelley VXY:[id number] DOB: Aug 23, 1955 DOA: 12/26/2019 PCP: Midge Minium, MD   LOS: 0 days   Brief Narrative / Interim history: 64 year old female with history of HTN, HLD, came into the hospital with abdominal pain.  Initially seen in the ED on 10/30 in the morning, was placed on antibiotics and sent home however she returned due to persistent and worsening pain.  A CT scan of the abdomen pelvis showed ascending and transverse colitis.  Patient tells me that over the last couple weeks she has been trying to lose weight and change her diet eating a lot of fresh salads with shrimp on top, smoothies.  She feels like maybe on couple of occasions the salad appeared to be wilted.   Subjective / 24h Interval events: Continues to complain of abdominal pain, does not like the clear liquid diet due to taste.  Mild nausea present, no vomiting.  No diarrhea episodes this morning  Assessment & Plan: Principal Problem Colitis, presumed infectious, acute-continue empiric ciprofloxacin and metronidazole.  Patient still has recurrent pain this morning intermittently requiring intravenous pain medications -GI following -Had some bright red blood, hemoglobin stable  Active Problems Essential hypertension-blood pressure acceptable, monitor for now, she is dry from diarrhea  Hypokalemia-due to diarrhea, repleted  Scheduled Meds: . potassium chloride  40 mEq Oral Q3H   Continuous Infusions: . ciprofloxacin 400 mg (12/27/19 0947)  . lactated ringers 100 mL/hr at 12/27/19 1015  . metronidazole 500 mg (12/27/19 8270)   PRN Meds:.acetaminophen **OR** acetaminophen, dicyclomine, morphine injection, ondansetron **OR** ondansetron (ZOFRAN) IV, oxyCODONE  Diet Orders (From admission, onward)    Start     Ordered   12/27/19 1211  Diet full liquid Room service appropriate? Yes; Fluid consistency: Thin  Diet effective now       Question Answer Comment  Room service appropriate? Yes    Fluid consistency: Thin      12/27/19 1210          DVT prophylaxis: SCDs Start: 12/26/19 2317     Code Status: Full Code  Family Communication: no family at bedside   Status is: Inpatient  Remains inpatient appropriate because:Inpatient level of care appropriate due to severity of illness   Dispo: The patient is from: Home              Anticipated d/c is to: Home              Anticipated d/c date is: 2 days              Patient currently is not medically stable to d/c.  Consultants:  GI  Procedures:  None   Microbiology  None   Antimicrobials: Ceftriaxone / Azithromycin     Objective: Vitals:   12/27/19 0137 12/27/19 0146 12/27/19 0628 12/27/19 0952  BP:  138/70 (!) 149/76 133/73  Pulse:  78 83 85  Resp:  18 20 18   Temp:  99.2 F (37.3 C)  98 F (36.7 C)  TempSrc:  Oral  Oral  SpO2:  98% 94% 91%  Weight: 91.7 kg     Height:        Intake/Output Summary (Last 24 hours) at 12/27/2019 1210 Last data filed at 12/27/2019 1145 Gross per 24 hour  Intake 1370.52 ml  Output 500 ml  Net 870.52 ml   Filed Weights   12/26/19 1935 12/27/19 0137  Weight: 90.7 kg 91.7 kg    Examination:  Constitutional: NAD Eyes: no scleral icterus ENMT: Mucous  membranes are moist.  Neck: normal, supple Respiratory: clear to auscultation bilaterally, no wheezing, no crackles.  Cardiovascular: Regular rate and rhythm, no murmurs / rubs / gallops.  Abdomen: Diffuse tenderness to palpation throughout, no guarding or rebound Musculoskeletal: no clubbing / cyanosis.  Skin: no rashes Neurologic: CN 2-12 grossly intact. Strength 5/5 in all 4.   Data Reviewed: I have independently reviewed following labs and imaging studies   CBC: Recent Labs  Lab 12/26/19 0611 12/26/19 2203 12/27/19 0603  WBC 9.9 13.8* 13.6*  13.7*  NEUTROABS 7.5 11.7* 10.9*  HGB 14.3 14.4 12.9  13.1  HCT 42.1 43.1 38.9  39.0  MCV 90.3 91.3 92.0  91.1  PLT 189 187 161  027   Basic  Metabolic Panel: Recent Labs  Lab 12/26/19 0611 12/26/19 2203 12/27/19 0603  NA 138 141 137  K 3.4* 3.5 3.1*  CL 103 104 104  CO2 24 23 24   GLUCOSE 117* 135* 130*  BUN 9 7* 5*  CREATININE 0.66 0.63 0.52  CALCIUM 9.0 9.1 8.2*   Liver Function Tests: Recent Labs  Lab 12/26/19 0611 12/26/19 2203  AST 22 19  ALT 20 18  ALKPHOS 64 68  BILITOT 0.6 0.9  PROT 7.6 7.7  ALBUMIN 4.4 4.3   Coagulation Profile: No results for input(s): INR, PROTIME in the last 168 hours. HbA1C: No results for input(s): HGBA1C in the last 72 hours. CBG: No results for input(s): GLUCAP in the last 168 hours.  Recent Results (from the past 240 hour(s))  Blood culture (routine x 2)     Status: None (Preliminary result)   Collection Time: 12/26/19 10:03 PM   Specimen: BLOOD  Result Value Ref Range Status   Specimen Description   Final    BLOOD RIGHT ARM Performed at [facility] [address], [place], [place] [address]    Special Requests   Final    BOTTLES DRAWN AEROBIC AND ANAEROBIC Blood Culture results may not be optimal due to an inadequate volume of blood received in culture bottles Performed at [facility] [address] [place], [place] [address]    Culture   Final    NO GROWTH < 12 HOURS Performed at [facility] [address] [place], [place] [address]    Report Status PENDING  Incomplete  Blood culture (routine x 2)     Status: None (Preliminary result)   Collection Time: 12/26/19 10:03 PM   Specimen: BLOOD  Result Value Ref Range Status   Specimen Description   Final    BLOOD LEFT ARM Performed at [facility] [address] [place], [place] [address]    Special Requests   Final    BOTTLES DRAWN AEROBIC AND ANAEROBIC Blood Culture results may not be optimal due to an inadequate volume of blood received in culture bottles Performed at [facility] [address], [place], [place]  [address]    Culture   Final    NO GROWTH < 12 HOURS Performed at [facility] [address] [place], [place] [address]    Report Status PENDING  Incomplete  Respiratory Panel by RT PCR (Flu A&B, Covid) - Nasopharyngeal Swab     Status: None   Collection Time: 12/27/19 12:20 AM   Specimen: Nasopharyngeal Swab  Result Value Ref Range Status   SARS Coronavirus 2 by RT PCR NEGATIVE NEGATIVE Final    Comment: (NOTE) SARS-CoV-2 target nucleic acids are  NOT DETECTED.  The SARS-CoV-2 RNA is generally detectable in upper respiratoy specimens during the acute phase of infection. The lowest concentration of SARS-CoV-2 viral copies this assay can detect is 131 copies/mL. A negative result does not preclude SARS-Cov-2 infection and should not be used as the sole basis for treatment or other patient management decisions. A negative result may occur with  improper specimen collection/handling, submission of specimen other than nasopharyngeal swab, presence of viral mutation(s) within the areas targeted by this assay, and inadequate number of viral copies (<131 copies/mL). A negative result must be combined with clinical observations, patient history, and epidemiological information. The expected result is Negative.  Fact Sheet for Patients:  PinkCheek.be  Fact Sheet for Healthcare Providers:  GravelBags.it  This test is no t yet approved or cleared by the [place] FDA and  has been authorized for detection and/or diagnosis of SARS-CoV-2 by FDA under an Emergency Use Authorization (EUA). This EUA will remain  in effect (meaning this test can be used) for the duration of the COVID-19 declaration under Section 564(b)(1) of the Act, 21 U.S.C. section 360bbb-3(b)(1), unless the authorization is terminated or revoked sooner.     Influenza A by PCR NEGATIVE NEGATIVE Final   Influenza B by PCR NEGATIVE NEGATIVE Final     Comment: (NOTE) The Xpert Xpress SARS-CoV-2/FLU/RSV assay is intended as an aid in  the diagnosis of influenza from Nasopharyngeal swab specimens and  should not be used as a sole basis for treatment. Nasal washings and  aspirates are unacceptable for Xpert Xpress SARS-CoV-2/FLU/RSV  testing.  Fact Sheet for Patients: PinkCheek.be  Fact Sheet for Healthcare Providers: GravelBags.it  This test is not yet approved or cleared by the [place] FDA and  has been authorized for detection and/or diagnosis of SARS-CoV-2 by  FDA under an Emergency Use Authorization (EUA). This EUA will remain  in effect (meaning this test can be used) for the duration of the  Covid-19 declaration under Section 564(b)(1) of the Act, 21  U.S.C. section 360bbb-3(b)(1), unless the authorization is  terminated or revoked. Performed at [facility], [place] [address] [place], [place] [address]      Radiology Studies: No results found.  Marzetta Board, MD, PhD [facility]  Between 7 am - 7 pm I am available, please contact me via Amion or Securechat  Between 7 pm - 7 am I am not available, please contact night coverage MD/APP via Amion

## 2019-12-28 DIAGNOSIS — K529 Noninfective gastroenteritis and colitis, unspecified: Secondary | ICD-10-CM | POA: Diagnosis not present

## 2019-12-28 LAB — COMPREHENSIVE METABOLIC PANEL
ALT: 11 U/L (ref 0–44)
AST: 13 U/L — ABNORMAL LOW (ref 15–41)
Albumin: 3.1 g/dL — ABNORMAL LOW (ref 3.5–5.0)
Alkaline Phosphatase: 48 U/L (ref 38–126)
Anion gap: 10 (ref 5–15)
BUN: <5 mg/dL — ABNORMAL LOW (ref 8–23)
CO2: 24 mmol/L (ref 22–32)
Calcium: 8.3 mg/dL — ABNORMAL LOW (ref 8.9–10.3)
Chloride: 101 mmol/L (ref 98–111)
Creatinine, Ser: 0.54 mg/dL (ref 0.44–1.00)
GFR, Estimated: >60 mL/min (ref >60)
Glucose, Bld: 124 mg/dL — ABNORMAL HIGH (ref 70–99)
Potassium: 3.8 mmol/L (ref 3.5–5.1)
Sodium: 135 mmol/L (ref 135–145)
Total Bilirubin: 0.7 mg/dL (ref 0.3–1.2)
Total Protein: 5.9 g/dL — ABNORMAL LOW (ref 6.5–8.1)

## 2019-12-28 LAB — CBC
HCT: 37.8 % (ref 36.0–46.0)
Hemoglobin: 12.4 g/dL (ref 12.0–15.0)
MCH: 30.2 pg (ref 26.0–34.0)
MCHC: 32.8 g/dL (ref 30.0–36.0)
MCV: 92.2 fL (ref 80.0–100.0)
Platelets: 153 10*3/uL (ref 150–400)
RBC: 4.1 MIL/uL (ref 3.87–5.11)
RDW: 14.1 % (ref 11.5–15.5)
WBC: 12.5 10*3/uL — ABNORMAL HIGH (ref 4.0–10.5)
nRBC: 0 % (ref 0.0–0.2)

## 2019-12-28 LAB — GASTROINTESTINAL PANEL BY PCR, STOOL (REPLACES STOOL CULTURE)

## 2019-12-28 NOTE — Progress Notes (Addendum)
    Progress Note   Subjective  Chief Complaint: Acute colitis  This morning, patient tells me that she is feeling some better but continues with abdominal discomfort and some loose stools with blood mixed in, her last one was earlier this morning.  Dicyclomine does help some of the cramping but seems to wear off.  Overall feeling slightly better.   Objective   Vital signs in last 24 hours: Temp:  [98.1 F (36.7 C)-98.7 F (37.1 C)] 98.1 F (36.7 C) (11/01 0500) Pulse Rate:  [78-86] 80 (11/01 0500) Resp:  [16-17] 17 (11/01 0500) BP: (130-160)/(76-80) 130/80 (11/01 0500) SpO2:  [93 %-95 %] 93 % (11/01 0500) Last BM Date: 12/27/19 General:    AA female in NAD Heart:  Regular rate and rhythm; no murmurs Lungs: Respirations even and unlabored, lungs CTA bilaterally Abdomen:  Soft, Moderate right sided ttp with involuntary guarding and nondistended. Normal bowel sounds. Extremities:  Without edema. Neurologic:  Alert and oriented,  grossly normal neurologically. Psych:  Cooperative. Normal mood and affect.  Intake/Output from previous day: 10/31 0701 - 11/01 0700 In: 1858 [P.O.:480; I.V.:978.1; IV Piggyback:399.9] Out: 200 [Urine:100; Emesis/NG output:100]  Lab Results: Recent Labs    12/26/19 2203 12/27/19 0603 12/28/19 0446  WBC 13.8* 13.6*  13.7* 12.5*  HGB 14.4 12.9  13.1 12.4  HCT 43.1 38.9  39.0 37.8  PLT 187 161  157 153   BMET Recent Labs    12/26/19 2203 12/27/19 0603 12/28/19 0446  NA 141 137 135  K 3.5 3.1* 3.8  CL 104 104 101  CO2 23 24 24   GLUCOSE 135* 130* 124*  BUN 7* 5* <5*  CREATININE 0.63 0.52 0.54  CALCIUM 9.1 8.2* 8.3*   LFT Recent Labs    12/28/19 0446  PROT 5.9*  ALBUMIN 3.1*  AST 13*  ALT 11  ALKPHOS 48  BILITOT 0.7     Assessment / Plan:   Assessment: 1.  Acute right-sided colitis: Onset 3 days ago with bloody diarrhea and abdominal pain, associated leukocytosis with no fever, suspect infectious, C. difficile is negative,  awaiting GI pathogen panel 2.  Hypertension 3.  Hyperlipidemia  Plan: 1.  Continue full liquids for now and increase as tolerated 2.  Continue IV Cipro and Flagyl 3.  Awaiting GI pathogen panel 4.  Continue IV analgesics as needed as well as Dicyclomine 10 mg every 6 hours 5.  At time of discharge patient will need to be arranged for an outpatient colonoscopy in 4 to 6 weeks per Dr. Lyndel Safe.  Would recommend a MiraLAX prep. 6.  Please await any further recommendations from Dr. Lyndel Safe later today  Thank you for kind consultation, we will continue to follow.    LOS: 1 day   Levin Erp  12/28/2019, 9:56 AM     Attending physician's note   I have taken an interval history, reviewed the chart and examined the patient. I agree with the Advanced Practitioner's note, impression and recommendations.   R Sided colitis- infectious vs inflammatory. Low prob being ischemic given patent IMA/SMA.  Improving gradually. C. Diff neg  Plan: -Continue supportive Rx. -Continue IV Cipro/Flagyl (A/Bs for total of 10 days) -Advance diet. -Ambulate -Await GI pathogen panel. -Agree with PRN dicyclomine. -Recommend colon in 4-6 weeks as outpt with miralax prep with Dr Carlean Purl or myself. Can schedule directly.  -Will sign off for now. Pl call if with any ?   Carmell Austria, MD [facility] Heck[facility] Fab[facility] Bruns [phone].

## 2019-12-28 NOTE — Progress Notes (Signed)
PROGRESS NOTE  Jamie Kelley KAJ:[id number] DOB: 1955-12-08 DOA: 12/26/2019 PCP: Midge Minium, MD   LOS: 1 day   Brief Narrative / Interim history: 64 year old female with history of HTN, HLD, came into the hospital with abdominal pain.  Initially seen in the ED on 10/30 in the morning, was placed on antibiotics and sent home however she returned due to persistent and worsening pain.  A CT scan of the abdomen pelvis showed ascending and transverse colitis.  Patient tells me that over the last couple weeks she has been trying to lose weight and change her diet eating a lot of fresh salads with shrimp on top, smoothies.  She feels like maybe on couple of occasions the salad appeared to be wilted.   Subjective / 24h Interval events: Overall better but still has intermittent crampy abdominal pain.  Assessment & Plan: Principal Problem Colitis, presumed infectious, acute-continue empiric ciprofloxacin and metronidazole.  Still has significant pain but overall improved -Advance diet as tolerated -C. difficile was negative, GI pathogen panel pending  Active Problems Essential hypertension-continue to monitor  Hypokalemia-due to diarrhea, repleted  Scheduled Meds:  Continuous Infusions: . ciprofloxacin 400 mg (12/28/19 0805)  . lactated ringers 100 mL/hr at 12/28/19 0807  . metronidazole 500 mg (12/28/19 0550)   PRN Meds:.acetaminophen **OR** acetaminophen, dicyclomine, morphine injection, ondansetron **OR** ondansetron (ZOFRAN) IV, oxyCODONE  Diet Orders (From admission, onward)    Start     Ordered   12/27/19 1211  Diet full liquid Room service appropriate? Yes; Fluid consistency: Thin  Diet effective now       Question Answer Comment  Room service appropriate? Yes   Fluid consistency: Thin      12/27/19 1210          DVT prophylaxis: SCDs Start: 12/26/19 2317     Code Status: Full Code  Family Communication: no family at bedside   Status is: Inpatient  Remains  inpatient appropriate because:Inpatient level of care appropriate due to severity of illness   Dispo: The patient is from: Home              Anticipated d/c is to: Home              Anticipated d/c date is: 2 days              Patient currently is not medically stable to d/c.  Consultants:  GI  Procedures:  None   Microbiology  None   Antimicrobials: Ceftriaxone / Azithromycin     Objective: Vitals:   12/27/19 1404 12/27/19 2054 12/27/19 2055 12/28/19 0500  BP: 138/77 (!) 160/76  130/80  Pulse: 86 78  80  Resp: 16 16  17   Temp: 98.4 F (36.9 C)  98.7 F (37.1 C) 98.1 F (36.7 C)  TempSrc: Oral  Oral Oral  SpO2: 93% 95%  93%  Weight:      Height:        Intake/Output Summary (Last 24 hours) at 12/28/2019 1257 Last data filed at 12/27/2019 2050 Gross per 24 hour  Intake 1196.42 ml  Output --  Net 1196.42 ml   Filed Weights   12/26/19 1935 12/27/19 0137  Weight: 90.7 kg 91.7 kg    Examination:  Constitutional: No distress Eyes: No icterus ENMT: Moist mucous membranes Neck: normal, supple Respiratory: No wheezing or crackles Cardiovascular: Regular, no murmur, no edema Abdomen: Mild tenderness throughout, improved, no guarding or rebound Musculoskeletal: no clubbing / cyanosis.  Skin: No rashes Neurologic: No focal  deficits  Data Reviewed: I have independently reviewed following labs and imaging studies   CBC: Recent Labs  Lab 12/26/19 0611 12/26/19 2203 12/27/19 0603 12/28/19 0446  WBC 9.9 13.8* 13.6*  13.7* 12.5*  NEUTROABS 7.5 11.7* 10.9*  --   HGB 14.3 14.4 12.9  13.1 12.4  HCT 42.1 43.1 38.9  39.0 37.8  MCV 90.3 91.3 92.0  91.1 92.2  PLT 189 187 161  157 099   Basic Metabolic Panel: Recent Labs  Lab 12/26/19 0611 12/26/19 2203 12/27/19 0603 12/28/19 0446  NA 138 141 137 135  K 3.4* 3.5 3.1* 3.8  CL 103 104 104 101  CO2 24 23 24 24   GLUCOSE 117* 135* 130* 124*  BUN 9 7* 5* <5*  CREATININE 0.66 0.63 0.52 0.54  CALCIUM 9.0  9.1 8.2* 8.3*   Liver Function Tests: Recent Labs  Lab 12/26/19 0611 12/26/19 2203 12/28/19 0446  AST 22 19 13*  ALT 20 18 11   ALKPHOS 64 68 48  BILITOT 0.6 0.9 0.7  PROT 7.6 7.7 5.9*  ALBUMIN 4.4 4.3 3.1*   Coagulation Profile: No results for input(s): INR, PROTIME in the last 168 hours. HbA1C: No results for input(s): HGBA1C in the last 72 hours. CBG: No results for input(s): GLUCAP in the last 168 hours.  Recent Results (from the past 240 hour(s))  Blood culture (routine x 2)     Status: None (Preliminary result)   Collection Time: 12/26/19 10:03 PM   Specimen: BLOOD  Result Value Ref Range Status   Specimen Description   Final    BLOOD RIGHT ARM Performed at [facility] [address] [place], [place] [address]    Special Requests   Final    BOTTLES DRAWN AEROBIC AND ANAEROBIC Blood Culture results may not be optimal due to an inadequate volume of blood received in culture bottles Performed at [facility] [address] [place], [place] [address]    Culture   Final    NO GROWTH 2 DAYS Performed at [facility] [address] [place], [place] [address]    Report Status PENDING  Incomplete  Blood culture (routine x 2)     Status: None (Preliminary result)   Collection Time: 12/26/19 10:03 PM   Specimen: BLOOD  Result Value Ref Range Status   Specimen Description   Final    BLOOD LEFT ARM Performed at [facility] [address], [place], [place] [address]    Special Requests   Final    BOTTLES DRAWN AEROBIC AND ANAEROBIC Blood Culture results may not be optimal due to an inadequate volume of blood received in culture bottles Performed at [facility] [address], [place], [place] [address]    Culture   Final    NO GROWTH 2 DAYS Performed at [facility] [address] [place], [place] [address]    Report Status PENDING  Incomplete  Respiratory Panel by RT  PCR (Flu A&B, Covid) - Nasopharyngeal Swab     Status: None   Collection Time: 12/27/19 12:20 AM   Specimen: Nasopharyngeal Swab  Result Value Ref Range Status   SARS Coronavirus 2 by RT PCR NEGATIVE NEGATIVE Final    Comment: (NOTE) SARS-CoV-2 target nucleic acids are NOT DETECTED.  The SARS-CoV-2 RNA is generally detectable in upper respiratoy specimens during the acute phase of infection. The lowest concentration of SARS-CoV-2 viral copies this assay can detect is 131  copies/mL. A negative result does not preclude SARS-Cov-2 infection and should not be used as the sole basis for treatment or other patient management decisions. A negative result may occur with  improper specimen collection/handling, submission of specimen other than nasopharyngeal swab, presence of viral mutation(s) within the areas targeted by this assay, and inadequate number of viral copies (<131 copies/mL). A negative result must be combined with clinical observations, patient history, and epidemiological information. The expected result is Negative.  Fact Sheet for Patients:  PinkCheek.be  Fact Sheet for Healthcare Providers:  GravelBags.it  This test is no t yet approved or cleared by the [place] FDA and  has been authorized for detection and/or diagnosis of SARS-CoV-2 by FDA under an Emergency Use Authorization (EUA). This EUA will remain  in effect (meaning this test can be used) for the duration of the COVID-19 declaration under Section 564(b)(1) of the Act, 21 U.S.C. section 360bbb-3(b)(1), unless the authorization is terminated or revoked sooner.     Influenza A by PCR NEGATIVE NEGATIVE Final   Influenza B by PCR NEGATIVE NEGATIVE Final    Comment: (NOTE) The Xpert Xpress SARS-CoV-2/FLU/RSV assay is intended as an aid in  the diagnosis of influenza from Nasopharyngeal swab specimens and  should not be used as a sole basis for  treatment. Nasal washings and  aspirates are unacceptable for Xpert Xpress SARS-CoV-2/FLU/RSV  testing.  Fact Sheet for Patients: PinkCheek.be  Fact Sheet for Healthcare Providers: GravelBags.it  This test is not yet approved or cleared by the [place] FDA and  has been authorized for detection and/or diagnosis of SARS-CoV-2 by  FDA under an Emergency Use Authorization (EUA). This EUA will remain  in effect (meaning this test can be used) for the duration of the  Covid-19 declaration under Section 564(b)(1) of the Act, 21  U.S.C. section 360bbb-3(b)(1), unless the authorization is  terminated or revoked. Performed at [facility], [place] [address] [place], [place] [address]   C Difficile Quick Screen w PCR reflex     Status: None   Collection Time: 12/27/19 12:13 PM   Specimen: STOOL  Result Value Ref Range Status   C Diff antigen NEGATIVE NEGATIVE Final   C Diff toxin NEGATIVE NEGATIVE Final   C Diff interpretation No C. difficile detected.  Final    Comment: Performed at [facility], [place] [address], [place], [place] [address]     Radiology Studies: No results found.  Marzetta Board, MD, PhD [facility]  Between 7 am - 7 pm I am available, please contact me via Amion or Securechat  Between 7 pm - 7 am I am not available, please contact night coverage MD/APP via Amion

## 2019-12-29 NOTE — Progress Notes (Signed)
[facility]:  Brief update:  Saw stool studies with toxigenic ecoli st the end of day yesterday- Dr. Lyndel Safe recommends d/c Flagyl (this has been done) and patient to continue Cipro for total of 10 days.  Let us know if you need anything further.  Ellouise Newer, PA-C

## 2019-12-29 NOTE — Progress Notes (Signed)
PROGRESS NOTE  Jamie Kelley TOI:[id number] DOB: [birth year] DOA: 12/26/2019 PCP: Midge Minium, MD   LOS: 2 days   Brief Narrative / Interim history: 64 year old female with history of HTN, HLD, came into the hospital with abdominal pain.  Initially seen in the ED on 10/30 in the morning, was placed on antibiotics and sent home however she returned due to persistent and worsening pain.  A CT scan of the abdomen pelvis showed ascending and transverse colitis.  Patient tells me that over the last couple weeks she has been trying to lose weight and change her diet eating a lot of fresh salads with shrimp on top, smoothies.  She feels like maybe on couple of occasions the salad appeared to be wilted.   Subjective / 24h Interval events: Reports the pain is still severe at times  Assessment & Plan: Principal Problem Acute colitis due to enteropathogenic E. coli-continue ciprofloxacin no longer needs metronidazole.  Overall pain is improving but still with significant cramping at times.  Will trial oral pain regimen today and if tolerates without need for further IV pain meds DC home on Wednesday -C. difficile was negative  Active Problems Hypokalemia-due to diarrhea, repleted  Scheduled Meds:  Continuous Infusions: . ciprofloxacin 400 mg (12/29/19 0905)  . lactated ringers 100 mL/hr at 12/29/19 1224   PRN Meds:.acetaminophen **OR** acetaminophen, dicyclomine, morphine injection, ondansetron **OR** ondansetron (ZOFRAN) IV, oxyCODONE  Diet Orders (From admission, onward)    Start     Ordered   12/27/19 1211  Diet full liquid Room service appropriate? Yes; Fluid consistency: Thin  Diet effective now       Question Answer Comment  Room service appropriate? Yes   Fluid consistency: Thin      12/27/19 1210          DVT prophylaxis: SCDs Start: 12/26/19 2317     Code Status: Full Code  Family Communication: no family at bedside   Status is: Inpatient  Remains inpatient  appropriate because:Inpatient level of care appropriate due to severity of illness   Dispo: The patient is from: Home              Anticipated d/c is to: Home              Anticipated d/c date is: 1 day              Patient currently is not medically stable to d/c.  Consultants:  GI  Procedures:  None   Microbiology  None   Antimicrobials: Ceftriaxone / Azithromycin     Objective: Vitals:   12/28/19 1417 12/28/19 2033 12/29/19 0458 12/29/19 1332  BP: (!) 158/81 (!) 148/65 (!) 142/74 136/73  Pulse: 71 76 83 91  Resp: 16 16 18 16   Temp: 98.5 F (36.9 C) 98.4 F (36.9 C) 98.9 F (37.2 C) 99.7 F (37.6 C)  TempSrc:      SpO2: 95% 91% 90% (!) 82%  Weight:      Height:        Intake/Output Summary (Last 24 hours) at 12/29/2019 1354 Last data filed at 12/29/2019 1224 Gross per 24 hour  Intake 1092.43 ml  Output 1050 ml  Net 42.43 ml   Filed Weights   12/26/19 1935 12/27/19 0137  Weight: 90.7 kg 91.7 kg    Examination:  Constitutional: NAD Eyes: No icterus ENMT: Moist mucous membranes Neck: normal, supple Respiratory: Clear, no wheezing, no crackles Cardiovascular: Regular rate and rhythm, no murmurs Abdomen: Persistent diffuse tenderness, no  guarding or rebound Musculoskeletal: no clubbing / cyanosis.  Skin: No rashes Neurologic: Nonfocal, ambulatory  Data Reviewed: I have independently reviewed following labs and imaging studies   CBC: Recent Labs  Lab 12/26/19 0611 12/26/19 2203 12/27/19 0603 12/28/19 0446  WBC 9.9 13.8* 13.6*  13.7* 12.5*  NEUTROABS 7.5 11.7* 10.9*  --   HGB 14.3 14.4 12.9  13.1 12.4  HCT 42.1 43.1 38.9  39.0 37.8  MCV 90.3 91.3 92.0  91.1 92.2  PLT 189 187 161  157 803   Basic Metabolic Panel: Recent Labs  Lab 12/26/19 0611 12/26/19 2203 12/27/19 0603 12/28/19 0446  NA 138 141 137 135  K 3.4* 3.5 3.1* 3.8  CL 103 104 104 101  CO2 24 23 24 24   GLUCOSE 117* 135* 130* 124*  BUN 9 7* 5* <5*  CREATININE 0.66 0.63  0.52 0.54  CALCIUM 9.0 9.1 8.2* 8.3*   Liver Function Tests: Recent Labs  Lab 12/26/19 0611 12/26/19 2203 12/28/19 0446  AST 22 19 13*  ALT 20 18 11   ALKPHOS 64 68 48  BILITOT 0.6 0.9 0.7  PROT 7.6 7.7 5.9*  ALBUMIN 4.4 4.3 3.1*   Coagulation Profile: No results for input(s): INR, PROTIME in the last 168 hours. HbA1C: No results for input(s): HGBA1C in the last 72 hours. CBG: No results for input(s): GLUCAP in the last 168 hours.  Recent Results (from the past 240 hour(s))  Blood culture (routine x 2)     Status: None (Preliminary result)   Collection Time: 12/26/19 10:03 PM   Specimen: BLOOD  Result Value Ref Range Status   Specimen Description   Final    BLOOD RIGHT ARM Performed at [facility] [address], [place], [place] [address]    Special Requests   Final    BOTTLES DRAWN AEROBIC AND ANAEROBIC Blood Culture results may not be optimal due to an inadequate volume of blood received in culture bottles Performed at [facility] [address], [place], [place] [address]    Culture   Final    NO GROWTH 3 DAYS Performed at [facility], [place] [address] [place], [place] [address]    Report Status PENDING  Incomplete  Blood culture (routine x 2)     Status: None (Preliminary result)   Collection Time: 12/26/19 10:03 PM   Specimen: BLOOD  Result Value Ref Range Status   Specimen Description   Final    BLOOD LEFT ARM Performed at [facility] [address], [place], [place] [address]    Special Requests   Final    BOTTLES DRAWN AEROBIC AND ANAEROBIC Blood Culture results may not be optimal due to an inadequate volume of blood received in culture bottles Performed at [facility] [address], [place], [place] [address]    Culture   Final    NO GROWTH 3 DAYS Performed at [facility], [place] [address], [place], [place] [address]    Report Status PENDING  Incomplete   Gastrointestinal Panel by PCR , Stool     Status: Abnormal   Collection Time: 12/26/19 11:33 PM   Specimen: Stool  Result Value Ref Range Status   Campylobacter species NOT DETECTED NOT DETECTED Final   Plesimonas shigelloides NOT DETECTED NOT DETECTED Final   Salmonella species NOT DETECTED NOT DETECTED Final   Yersinia enterocolitica NOT DETECTED NOT DETECTED Final   Vibrio species NOT DETECTED NOT DETECTED Final  Vibrio cholerae NOT DETECTED NOT DETECTED Final   Enteroaggregative E coli (EAEC) NOT DETECTED NOT DETECTED Final   Enteropathogenic E coli (EPEC) DETECTED (A) NOT DETECTED Final    Comment: RESULT CALLED TO, READ BACK BY AND VERIFIED WITH: JT TAYLOR @1631  12/28/19 MJU    Enterotoxigenic E coli (ETEC) NOT DETECTED NOT DETECTED Final   Shiga like toxin producing E coli (STEC) NOT DETECTED NOT DETECTED Final   Shigella/Enteroinvasive E coli (EIEC) NOT DETECTED NOT DETECTED Final   Cryptosporidium NOT DETECTED NOT DETECTED Final   Cyclospora cayetanensis NOT DETECTED NOT DETECTED Final   Entamoeba histolytica NOT DETECTED NOT DETECTED Final   Giardia lamblia NOT DETECTED NOT DETECTED Final   Adenovirus F40/41 NOT DETECTED NOT DETECTED Final   Astrovirus NOT DETECTED NOT DETECTED Final   Norovirus GI/GII NOT DETECTED NOT DETECTED Final   Rotavirus A NOT DETECTED NOT DETECTED Final   Sapovirus (I, II, IV, and V) NOT DETECTED NOT DETECTED Final    Comment: Performed at [facility], [place] [place], [place] [address]  Respiratory Panel by RT PCR (Flu A&B, Covid) - Nasopharyngeal Swab     Status: None   Collection Time: 12/27/19 12:20 AM   Specimen: Nasopharyngeal Swab  Result Value Ref Range Status   SARS Coronavirus 2 by RT PCR NEGATIVE NEGATIVE Final    Comment: (NOTE) SARS-CoV-2 target nucleic acids are NOT DETECTED.  The SARS-CoV-2 RNA is generally detectable in upper respiratoy specimens during the acute phase of infection. The lowest concentration  of SARS-CoV-2 viral copies this assay can detect is 131 copies/mL. A negative result does not preclude SARS-Cov-2 infection and should not be used as the sole basis for treatment or other patient management decisions. A negative result may occur with  improper specimen collection/handling, submission of specimen other than nasopharyngeal swab, presence of viral mutation(s) within the areas targeted by this assay, and inadequate number of viral copies (<131 copies/mL). A negative result must be combined with clinical observations, patient history, and epidemiological information. The expected result is Negative.  Fact Sheet for Patients:  PinkCheek.be  Fact Sheet for Healthcare Providers:  GravelBags.it  This test is no t yet approved or cleared by the [place] FDA and  has been authorized for detection and/or diagnosis of SARS-CoV-2 by FDA under an Emergency Use Authorization (EUA). This EUA will remain  in effect (meaning this test can be used) for the duration of the COVID-19 declaration under Section 564(b)(1) of the Act, 21 U.S.C. section 360bbb-3(b)(1), unless the authorization is terminated or revoked sooner.     Influenza A by PCR NEGATIVE NEGATIVE Final   Influenza B by PCR NEGATIVE NEGATIVE Final    Comment: (NOTE) The Xpert Xpress SARS-CoV-2/FLU/RSV assay is intended as an aid in  the diagnosis of influenza from Nasopharyngeal swab specimens and  should not be used as a sole basis for treatment. Nasal washings and  aspirates are unacceptable for Xpert Xpress SARS-CoV-2/FLU/RSV  testing.  Fact Sheet for Patients: PinkCheek.be  Fact Sheet for Healthcare Providers: GravelBags.it  This test is not yet approved or cleared by the [place] FDA and  has been authorized for detection and/or diagnosis of SARS-CoV-2 by  FDA under an Emergency Use  Authorization (EUA). This EUA will remain  in effect (meaning this test can be used) for the duration of the  Covid-19 declaration under Section 564(b)(1) of the Act, 21  U.S.C. section 360bbb-3(b)(1), unless the authorization is  terminated or revoked. Performed at  [facility], [place] [address] [place], [place] [address]   C Difficile Quick Screen w PCR reflex     Status: None   Collection Time: 12/27/19 12:13 PM   Specimen: STOOL  Result Value Ref Range Status   C Diff antigen NEGATIVE NEGATIVE Final   C Diff toxin NEGATIVE NEGATIVE Final   C Diff interpretation No C. difficile detected.  Final    Comment: Performed at [facility], [place] [address], [place], [place] [address]     Radiology Studies: No results found.  Marzetta Board, MD, PhD [facility]  Between 7 am - 7 pm I am available, please contact me via Amion or Securechat  Between 7 pm - 7 am I am not available, please contact night coverage MD/APP via Amion

## 2019-12-29 NOTE — Plan of Care (Signed)
  Problem: Activity: Goal: Risk for activity intolerance will decrease Outcome: Progressing   Problem: Nutrition: Goal: Adequate nutrition will be maintained Outcome: Progressing   Problem: Pain Managment: Goal: General experience of comfort will improve Outcome: Progressing   

## 2019-12-30 ENCOUNTER — Inpatient Hospital Stay (HOSPITAL_COMMUNITY): Payer: No Typology Code available for payment source

## 2019-12-30 DIAGNOSIS — J9601 Acute respiratory failure with hypoxia: Secondary | ICD-10-CM

## 2019-12-30 DIAGNOSIS — A419 Sepsis, unspecified organism: Principal | ICD-10-CM

## 2019-12-30 DIAGNOSIS — R6520 Severe sepsis without septic shock: Secondary | ICD-10-CM

## 2019-12-30 LAB — COMPREHENSIVE METABOLIC PANEL
ALT: 13 U/L (ref 0–44)
AST: 18 U/L (ref 15–41)
Albumin: 3.1 g/dL — ABNORMAL LOW (ref 3.5–5.0)
Alkaline Phosphatase: 50 U/L (ref 38–126)
Anion gap: 9 (ref 5–15)
BUN: <5 mg/dL — ABNORMAL LOW (ref 8–23)
CO2: 29 mmol/L (ref 22–32)
Calcium: 8.4 mg/dL — ABNORMAL LOW (ref 8.9–10.3)
Chloride: 99 mmol/L (ref 98–111)
Creatinine, Ser: 0.6 mg/dL (ref 0.44–1.00)
GFR, Estimated: >60 mL/min (ref >60)
Glucose, Bld: 113 mg/dL — ABNORMAL HIGH (ref 70–99)
Potassium: 3.7 mmol/L (ref 3.5–5.1)
Sodium: 137 mmol/L (ref 135–145)
Total Bilirubin: 0.9 mg/dL (ref 0.3–1.2)
Total Protein: 5.9 g/dL — ABNORMAL LOW (ref 6.5–8.1)

## 2019-12-30 LAB — CBC WITH DIFFERENTIAL/PLATELET
Abs Immature Granulocytes: 0.1 10*3/uL — ABNORMAL HIGH (ref 0.00–0.07)
Basophils Absolute: 0.1 10*3/uL (ref 0.0–0.1)
Basophils Relative: 1 %
Eosinophils Absolute: 0.1 10*3/uL (ref 0.0–0.5)
Eosinophils Relative: 1 %
HCT: 35.5 % — ABNORMAL LOW (ref 36.0–46.0)
Hemoglobin: 11.9 g/dL — ABNORMAL LOW (ref 12.0–15.0)
Immature Granulocytes: 1 %
Lymphocytes Relative: 18 %
Lymphs Abs: 2.1 10*3/uL (ref 0.7–4.0)
MCH: 30.3 pg (ref 26.0–34.0)
MCHC: 33.5 g/dL (ref 30.0–36.0)
MCV: 90.3 fL (ref 80.0–100.0)
Monocytes Absolute: 1.3 10*3/uL — ABNORMAL HIGH (ref 0.1–1.0)
Monocytes Relative: 11 %
Neutro Abs: 7.9 10*3/uL — ABNORMAL HIGH (ref 1.7–7.7)
Neutrophils Relative %: 68 %
Platelets: 158 10*3/uL (ref 150–400)
RBC: 3.93 MIL/uL (ref 3.87–5.11)
RDW: 13.9 % (ref 11.5–15.5)
WBC: 11.4 10*3/uL — ABNORMAL HIGH (ref 4.0–10.5)
nRBC: 0.3 % — ABNORMAL HIGH (ref 0.0–0.2)

## 2019-12-30 LAB — PROCALCITONIN: Procalcitonin: <0.1 ng/mL

## 2019-12-30 LAB — PHOSPHORUS: Phosphorus: 2.6 mg/dL (ref 2.5–4.6)

## 2019-12-30 LAB — LACTIC ACID, PLASMA
Lactic Acid, Venous: 0.7 mmol/L (ref 0.5–1.9)
Lactic Acid, Venous: 0.8 mmol/L (ref 0.5–1.9)

## 2019-12-30 LAB — MAGNESIUM: Magnesium: 1.9 mg/dL (ref 1.7–2.4)

## 2019-12-30 MED ORDER — SODIUM CHLORIDE 0.9 % IV BOLUS
1000.0000 mL | Freq: Once | INTRAVENOUS | Status: AC
  Administered 2019-12-30: 1000 mL via INTRAVENOUS

## 2019-12-30 MED ORDER — LACTATED RINGERS IV SOLN: INTRAVENOUS | Status: AC

## 2019-12-30 NOTE — Progress Notes (Signed)
PROGRESS NOTE    Jamie Kelley  RKY:[id number] DOB: 03/30/1955 DOA: 12/26/2019 PCP: Midge Minium, MD  Brief Narrative:  HPI per Dr. Jennette Kettle on 12/26/19 Jamie Kelley is a 64 y.o. female with medical history significant of HTN, HLD.  Pt seen earlier today for abd pain.  Onset 2 days ago.  Diffuse abd cramping, diarrhea.  Diarrhea is bloody.  No h/o diverticulitis nor colitis.  No fever, N/V.  Discharged home with cipro/flagyl.  Pain has persisted and pt returns to ED.  ED Course: CT earlier today shows colitis.  HGB stable at 14.  Given cipro/flagyl and hospitalist asked to admit.  **Interim History Over the last few weeks the patient was trying to lose weight and change her diet eating habits and was eating a lot of fresh salads with strep on top and smoothies.  And at times she felt on occasion does have appear to be altered.  She is found to have enteropathogenic E. coli and subsequently this morning spiked a temperature and became septic appearing.  She also went in acute hypoxic respiratory failure and a chest x-ray shows possible right base pneumonia with some cardiomegaly which is mild.  Fluid rate has been reduced and blood cultures will be obtained.  Will need to continue monitor carefully as she has a new O2 requirement and because that she met sepsis criteria this morning  Assessment & Plan:   Principal Problem:   Colitis presumed infectious Active Problems:   HTN (hypertension)   BRBPR (bright red blood per rectum)   Colitis  Acute colitis due to enteropathogenic E. Coli -Continue ciprofloxacin x 10 days total as she no longer needs metronidazole.   -Overall pain is improving but still with significant cramping at times.   -Will Continue trial oral pain regimen today and if tolerates without need for further IV pain meds  -C. difficile was negative -Still has some abdominal cramping  -continue to monitor closely and continue with supportive care and  continue with Bentyl 10 mg p.o. every 6 as needed for spasms, acetaminophen, 650 mg p.o./RC every 6 as needed for mild pain or fever, Oxycodone 5 mg p.o. every 4h  For breakthrough pain, Morphine 2 to 4 mg IV every 4 as needed for severe pain, antiemetics with ondansetron 4 mg p.o./IV every 6 as needed for nausea as well as -Continue with full liquid diet and advance slowly to soft possibly in the a.m.  Hypokalemia -due to diarrhea, repleted -Potassium is now 3.7 -Continue to monitor and replete as necessary -Check magnesium Level daily  -Repeat CMP in a.m.  Severe Sepsis from Enteropathogenic E. coli with possible concomitant pneumonia -Was febrile overnight with a TMax of 101.2, Tachycardic, Has a Leukocytosis (13.7 and trending down to 11.4)and a Source of Infection with Enteropathogenic E Coli and went into Acute Respiratory Failure with Hypoxia -Give 1 Liter NS Bolus; C/w LR at 100 mL/hr and reduce to 75 mL/hr for 12 hours -Lactic acid level on admission was 1.0 and repeat this morning was 0.7 and further repeat was 0.8 -Procalcitonin level was less than 0.10 -Patient's WBC is trending downward from 13.7 -> 12.5 -> 11.4 -ESR was 20 -Check blood cultures  Essential Hypertension -Blood pressures were a little elevated this morning at 151/85 -We will continue to monitor blood pressures per protocol -If necessary will add as needed hydralazine  Acute Respiratory Failure with Hypoxia in the setting of above and possible pneumonia -In the setting of As above -Check DG  Chest X-Ray and showed "Patchy mainly linear airspace opacities at the RIGHT lung base, may represent atelectasis or early infection. Mild cardiomegaly and central pulmonary vascular engorgement." -Noted to be hypoxic this AM and his O2 saturation's on 86% on room air -Flutter Valve and Incentive Spirometry -SpO2: 96 % O2 Flow Rate (L/min): 2 L/min  -Check BNP and if necessary will obtain an echocardiogram; patient is  positive for 4.085 L since admission -Continuous Pulse Oximetry and maintain O2 saturations greater than 90% -Continue supplemental oxygen via nasal cannula and wean O2 as tolerated -We will need an ambulatory home O2 screen prior to discharge -P chest x-ray in a.m. and if still not improving may consider CT scan of the chest  Hyperglcyemia -Likely reactive but will need to rule out diabetes so will obtain a hemoglobin A1c  -Patient's blood sugar has been ranging from 113-130 daily on BMP/CMP  -Continue monitor blood sugars carefully and if necessary will place on sensitive NovoLog/scale insulin AC  Normocytic Anemia -Patient's hemoglobin/hematocrit went from 13.1/39.0 -> 12.4/37.8 -> 11.9/35.5 -Check anemia panel in a.m. next-likely this is dilutional drop in the setting of IV fluid resuscitation -Continue monitor for signs and symptoms of bleeding; currently no overt bleeding noted -Repeat CBC in a.m.  Obesity -Complicates overall Prognosis and Care -Estimated body mass index is 39.48 kg/m as calculated from the following:   Height as of this encounter: 5' (1.524 m).   Weight as of this encounter: 91.7 kg. -Weight Loss and Dietary Counseling given    DVT prophylaxis: SCDs Code Status: FULL CODE Family Communication: No family presently at bedside Disposition Plan: Pending further clinical improvement and weaning of supplemental oxygen.  Status is: Inpatient  Remains inpatient appropriate because:Unsafe d/c plan, IV treatments appropriate due to intensity of illness or inability to take PO and Inpatient level of care appropriate due to severity of illness   Dispo: The patient is from: Home              Anticipated d/c is to: Home              Anticipated d/c date is: 2 days              Patient currently is not medically stable to d/c.  Consultants:   Gastroenterology    Procedures: None  Antimicrobials: Anti-infectives (From admission, onward)   Start     Dose/Rate  Route Frequency Ordered Stop   12/27/19 1000  ciprofloxacin (CIPRO) IVPB 400 mg        400 mg 200 mL/hr over 60 Minutes Intravenous Every 12 hours 12/26/19 2310     12/27/19 0600  metroNIDAZOLE (FLAGYL) IVPB 500 mg  Status:  Discontinued        500 mg 100 mL/hr over 60 Minutes Intravenous Every 8 hours 12/26/19 2304 12/29/19 0845   12/26/19 2145  ciprofloxacin (CIPRO) IVPB 400 mg        400 mg 200 mL/hr over 60 Minutes Intravenous  Once 12/26/19 2132 12/27/19 0025   12/26/19 2145  metroNIDAZOLE (FLAGYL) IVPB 500 mg        500 mg 100 mL/hr over 60 Minutes Intravenous  Once 12/26/19 2132 12/27/19 0014     Subjective: Seen and examined at bedside and was complaining of some abdominal discomfort still and did not feel well.  She felt fatigued and had to be placed on supplemental oxygen via nasal cannula this morning.  She denies any nausea or vomiting.  No chest pain.  Denies  any other concerns or complaints at this time  Objective: Vitals:   12/30/19 0732 12/30/19 0908 12/30/19 0942 12/30/19 1216  BP: (!) 174/88 (!) 144/80 (!) 171/86 (!) 151/85  Pulse: (!) 101 99 (!) 106 97  Resp: $Remo'15 18 16 18  'PrTsy$ Temp: 99.8 F (37.7 C) 99.7 F (37.6 C) 99.5 F (37.5 C) 99.4 F (37.4 C)  TempSrc: Oral Oral    SpO2: 94% 93% 95% 96%  Weight:      Height:        Intake/Output Summary (Last 24 hours) at 12/30/2019 1224 Last data filed at 12/30/2019 0935 Gross per 24 hour  Intake 1436.22 ml  Output 2100 ml  Net -663.78 ml   Filed Weights   12/26/19 1935 12/27/19 0137  Weight: 90.7 kg 91.7 kg   Examination: Physical Exam:  Constitutional: WN/WD obese African-American female currently in no acute distress appears slightly fatigued and ill-appearing Eyes: Lids and conjunctivae normal, sclerae anicteric  ENMT: External Ears, Nose appear normal. Grossly normal hearing.  Neck: Appears normal, supple, no cervical masses, normal ROM, no appreciable thyromegaly: No appreciable JVD but difficult to assess  given her body habitus Respiratory: Diminished to auscultation bilaterally with coarse breath sounds worse slightly on the right compared to left. Normal respiratory effort and patient is not tachypenic. No accessory muscle use.  Wearing supplemental oxygen via nasal cannula Cardiovascular: RRR, no murmurs / rubs / gallops. S1 and S2 auscultated.  Minimal extremity edema Abdomen: Soft, tender to palpate.  Distended secondary body habitus.Bowel sounds positive.  GU: Deferred. Musculoskeletal: No clubbing / cyanosis of digits/nails. No joint deformity upper and lower extremities Skin: No rashes, lesions, ulcers on limited skin evaluation. No induration; Warm and dry.  Neurologic: CN 2-12 grossly intact with no focal deficits. Romberg sign and cerebellar reflexes not assessed.  Psychiatric: Normal judgment and insight. Alert and oriented x 3. Normal mood and appropriate affect.   Data Reviewed: I have personally reviewed following labs and imaging studies  CBC: Recent Labs  Lab 12/26/19 0611 12/26/19 2203 12/27/19 0603 12/28/19 0446 12/30/19 0821  WBC 9.9 13.8* 13.6*  13.7* 12.5* 11.4*  NEUTROABS 7.5 11.7* 10.9*  --  7.9*  HGB 14.3 14.4 12.9  13.1 12.4 11.9*  HCT 42.1 43.1 38.9  39.0 37.8 35.5*  MCV 90.3 91.3 92.0  91.1 92.2 90.3  PLT 189 187 161  157 153 921   Basic Metabolic Panel: Recent Labs  Lab 12/26/19 0611 12/26/19 2203 12/27/19 0603 12/28/19 0446 12/30/19 0821  NA 138 141 137 135 137  K 3.4* 3.5 3.1* 3.8 3.7  CL 103 104 104 101 99  CO2 $Re'24 23 24 24 29  'pzG$ GLUCOSE 117* 135* 130* 124* 113*  BUN 9 7* 5* <5* <5*  CREATININE 0.66 0.63 0.52 0.54 0.60  CALCIUM 9.0 9.1 8.2* 8.3* 8.4*  MG  --   --   --   --  1.9  PHOS  --   --   --   --  2.6   GFR: Estimated Creatinine Clearance: 73.7 mL/min (by C-G formula based on SCr of 0.6 mg/dL). Liver Function Tests: Recent Labs  Lab 12/26/19 0611 12/26/19 2203 12/28/19 0446 12/30/19 0821  AST 22 19 13* 18  ALT $Re'20 18 11 13   'wgv$ ALKPHOS 64 68 48 50  BILITOT 0.6 0.9 0.7 0.9  PROT 7.6 7.7 5.9* 5.9*  ALBUMIN 4.4 4.3 3.1* 3.1*   Recent Labs  Lab 12/26/19 0611  LIPASE 30   No results for  input(s): AMMONIA in the last 168 hours. Coagulation Profile: No results for input(s): INR, PROTIME in the last 168 hours. Cardiac Enzymes: No results for input(s): CKTOTAL, CKMB, CKMBINDEX, TROPONINI in the last 168 hours. BNP (last 3 results) No results for input(s): PROBNP in the last 8760 hours. HbA1C: No results for input(s): HGBA1C in the last 72 hours. CBG: No results for input(s): GLUCAP in the last 168 hours. Lipid Profile: No results for input(s): CHOL, HDL, LDLCALC, TRIG, CHOLHDL, LDLDIRECT in the last 72 hours. Thyroid Function Tests: No results for input(s): TSH, T4TOTAL, FREET4, T3FREE, THYROIDAB in the last 72 hours. Anemia Panel: No results for input(s): VITAMINB12, FOLATE, FERRITIN, TIBC, IRON, RETICCTPCT in the last 72 hours. Sepsis Labs: Recent Labs  Lab 12/26/19 2203 12/27/19 0040 12/30/19 0821 12/30/19 1047  PROCALCITON  --   --  <0.10  --   LATICACIDVEN 1.0 1.0 0.7 0.8    Recent Results (from the past 240 hour(s))  Blood culture (routine x 2)     Status: None (Preliminary result)   Collection Time: 12/26/19 10:03 PM   Specimen: BLOOD  Result Value Ref Range Status   Specimen Description   Final    BLOOD RIGHT ARM Performed at [facility] [address] [place], [place] [address]    Special Requests   Final    BOTTLES DRAWN AEROBIC AND ANAEROBIC Blood Culture results may not be optimal due to an inadequate volume of blood received in culture bottles Performed at [facility] [address], [place], [place] [address]    Culture   Final    NO GROWTH 4 DAYS Performed at [facility], [place] [address] [place], [place] [address]    Report Status PENDING  Incomplete  Blood culture (routine x 2)     Status: None (Preliminary result)    Collection Time: 12/26/19 10:03 PM   Specimen: BLOOD  Result Value Ref Range Status   Specimen Description   Final    BLOOD LEFT ARM Performed at [facility] [address] [place], [place] [address]    Special Requests   Final    BOTTLES DRAWN AEROBIC AND ANAEROBIC Blood Culture results may not be optimal due to an inadequate volume of blood received in culture bottles Performed at [facility] [address], [place], [place] [address]    Culture   Final    NO GROWTH 4 DAYS Performed at [facility], [place] [address], [place], [place] [address]    Report Status PENDING  Incomplete  Gastrointestinal Panel by PCR , Stool     Status: Abnormal   Collection Time: 12/26/19 11:33 PM   Specimen: Stool  Result Value Ref Range Status   Campylobacter species NOT DETECTED NOT DETECTED Final   Plesimonas shigelloides NOT DETECTED NOT DETECTED Final   Salmonella species NOT DETECTED NOT DETECTED Final   Yersinia enterocolitica NOT DETECTED NOT DETECTED Final   Vibrio species NOT DETECTED NOT DETECTED Final   Vibrio cholerae NOT DETECTED NOT DETECTED Final   Enteroaggregative E coli (EAEC) NOT DETECTED NOT DETECTED Final   Enteropathogenic E coli (EPEC) DETECTED (A) NOT DETECTED Final    Comment: RESULT CALLED TO, READ BACK BY AND VERIFIED WITH: JT TAYLOR $RemoveB'@1631'RbsPbznU$  12/28/19 MJU    Enterotoxigenic E coli (ETEC) NOT DETECTED NOT DETECTED Final   Shiga like toxin producing E coli (STEC) NOT DETECTED NOT DETECTED Final   Shigella/Enteroinvasive E coli (EIEC) NOT  DETECTED NOT DETECTED Final   Cryptosporidium NOT DETECTED NOT DETECTED Final   Cyclospora cayetanensis NOT DETECTED NOT DETECTED Final   Entamoeba histolytica NOT DETECTED NOT DETECTED Final   Giardia lamblia NOT DETECTED NOT DETECTED Final   Adenovirus F40/41 NOT DETECTED NOT DETECTED Final   Astrovirus NOT DETECTED NOT DETECTED Final   Norovirus GI/GII NOT DETECTED NOT DETECTED Final    Rotavirus A NOT DETECTED NOT DETECTED Final   Sapovirus (I, II, IV, and V) NOT DETECTED NOT DETECTED Final    Comment: Performed at [facility], [address] [place], [place] [address]  Respiratory Panel by RT PCR (Flu A&B, Covid) - Nasopharyngeal Swab     Status: None   Collection Time: 12/27/19 12:20 AM   Specimen: Nasopharyngeal Swab  Result Value Ref Range Status   SARS Coronavirus 2 by RT PCR NEGATIVE NEGATIVE Final    Comment: (NOTE) SARS-CoV-2 target nucleic acids are NOT DETECTED.  The SARS-CoV-2 RNA is generally detectable in upper respiratoy specimens during the acute phase of infection. The lowest concentration of SARS-CoV-2 viral copies this assay can detect is 131 copies/mL. A negative result does not preclude SARS-Cov-2 infection and should not be used as the sole basis for treatment or other patient management decisions. A negative result may occur with  improper specimen collection/handling, submission of specimen other than nasopharyngeal swab, presence of viral mutation(s) within the areas targeted by this assay, and inadequate number of viral copies (<131 copies/mL). A negative result must be combined with clinical observations, patient history, and epidemiological information. The expected result is Negative.  Fact Sheet for Patients:  https://www.moore.com/  Fact Sheet for Healthcare Providers:  https://www.young.biz/  This test is no t yet approved or cleared by the [place] FDA and  has been authorized for detection and/or diagnosis of SARS-CoV-2 by FDA under an Emergency Use Authorization (EUA). This EUA will remain  in effect (meaning this test can be used) for the duration of the COVID-19 declaration under Section 564(b)(1) of the Act, 21 U.S.C. section 360bbb-3(b)(1), unless the authorization is terminated or revoked sooner.     Influenza A by PCR NEGATIVE NEGATIVE Final   Influenza B by PCR  NEGATIVE NEGATIVE Final    Comment: (NOTE) The Xpert Xpress SARS-CoV-2/FLU/RSV assay is intended as an aid in  the diagnosis of influenza from Nasopharyngeal swab specimens and  should not be used as a sole basis for treatment. Nasal washings and  aspirates are unacceptable for Xpert Xpress SARS-CoV-2/FLU/RSV  testing.  Fact Sheet for Patients: https://www.moore.com/  Fact Sheet for Healthcare Providers: https://www.young.biz/  This test is not yet approved or cleared by the [place] FDA and  has been authorized for detection and/or diagnosis of SARS-CoV-2 by  FDA under an Emergency Use Authorization (EUA). This EUA will remain  in effect (meaning this test can be used) for the duration of the  Covid-19 declaration under Section 564(b)(1) of the Act, 21  U.S.C. section 360bbb-3(b)(1), unless the authorization is  terminated or revoked. Performed at [facility], [address] [place], [place] [address]   C Difficile Quick Screen w PCR reflex     Status: None   Collection Time: 12/27/19 12:13 PM   Specimen: STOOL  Result Value Ref Range Status   C Diff antigen NEGATIVE NEGATIVE Final   C Diff toxin NEGATIVE NEGATIVE Final   C Diff interpretation No C. difficile detected.  Final    Comment: Performed at [facility],  [place] [address] [place], [place] [address]   RN Pressure Injury Documentation:     Estimated body mass index is 39.48 kg/m as calculated from the following:   Height as of this encounter: 5' (1.524 m).   Weight as of this encounter: 91.7 kg.  Malnutrition Type:      Malnutrition Characteristics:      Nutrition Interventions:    Radiology Studies: DG CHEST PORT 1 VIEW  Result Date: 12/30/2019 CLINICAL DATA:  64 year old with history of hypertension question of colitis on recent CT EXAM: PORTABLE CHEST 1 VIEW COMPARISON:  11/24/2008 FINDINGS: Low normal lung volumes.  Mild cardiac enlargement accentuated by low lung volumes. Mild central pulmonary vascular engorgement. Mild elevation of the RIGHT hemidiaphragm relative to the LEFT is similar to what is seen on the recent CT evaluation. Patchy mainly linear airspace opacities at the RIGHT lung base. No lobar consolidative changes. No sign of pleural effusion. On limited assessment no acute skeletal process. IMPRESSION: Patchy mainly linear airspace opacities at the RIGHT lung base, may represent atelectasis or early infection. Mild cardiomegaly and central pulmonary vascular engorgement. Electronically Signed   By: Zetta Bills M.D.   On: 12/30/2019 10:40    Scheduled Meds: Continuous Infusions: . ciprofloxacin 400 mg (12/30/19 0938)  . lactated ringers     Raiford Noble, DO [facility] PAGER is on [place]  If 7PM-7AM, please contact night-coverage [email or web address]

## 2019-12-30 NOTE — Plan of Care (Signed)
  Problem: Activity: Goal: Risk for activity intolerance will decrease Outcome: Progressing   Problem: Nutrition: Goal: Adequate nutrition will be maintained Outcome: Progressing   Problem: Elimination: Goal: Will not experience complications related to bowel motility Outcome: Progressing   Problem: Pain Managment: Goal: General experience of comfort will improve Outcome: Progressing   

## 2019-12-31 ENCOUNTER — Inpatient Hospital Stay (HOSPITAL_COMMUNITY): Payer: No Typology Code available for payment source

## 2019-12-31 DIAGNOSIS — J189 Pneumonia, unspecified organism: Secondary | ICD-10-CM

## 2019-12-31 LAB — COMPREHENSIVE METABOLIC PANEL
ALT: 14 U/L (ref 0–44)
AST: 19 U/L (ref 15–41)
Albumin: 3 g/dL — ABNORMAL LOW (ref 3.5–5.0)
Alkaline Phosphatase: 47 U/L (ref 38–126)
Anion gap: 9 (ref 5–15)
BUN: <5 mg/dL — ABNORMAL LOW (ref 8–23)
CO2: 28 mmol/L (ref 22–32)
Calcium: 8.3 mg/dL — ABNORMAL LOW (ref 8.9–10.3)
Chloride: 97 mmol/L — ABNORMAL LOW (ref 98–111)
Creatinine, Ser: 0.48 mg/dL (ref 0.44–1.00)
GFR, Estimated: >60 mL/min (ref >60)
Glucose, Bld: 107 mg/dL — ABNORMAL HIGH (ref 70–99)
Potassium: 3.9 mmol/L (ref 3.5–5.1)
Sodium: 134 mmol/L — ABNORMAL LOW (ref 135–145)
Total Bilirubin: 0.7 mg/dL (ref 0.3–1.2)
Total Protein: 5.7 g/dL — ABNORMAL LOW (ref 6.5–8.1)

## 2019-12-31 LAB — CBC WITH DIFFERENTIAL/PLATELET
Abs Immature Granulocytes: 0.12 10*3/uL — ABNORMAL HIGH (ref 0.00–0.07)
Basophils Absolute: 0.1 10*3/uL (ref 0.0–0.1)
Basophils Relative: 1 %
Eosinophils Absolute: 0.2 10*3/uL (ref 0.0–0.5)
Eosinophils Relative: 2 %
HCT: 35.8 % — ABNORMAL LOW (ref 36.0–46.0)
Hemoglobin: 11.8 g/dL — ABNORMAL LOW (ref 12.0–15.0)
Immature Granulocytes: 1 %
Lymphocytes Relative: 17 %
Lymphs Abs: 1.8 10*3/uL (ref 0.7–4.0)
MCH: 30.3 pg (ref 26.0–34.0)
MCHC: 33 g/dL (ref 30.0–36.0)
MCV: 92 fL (ref 80.0–100.0)
Monocytes Absolute: 1.3 10*3/uL — ABNORMAL HIGH (ref 0.1–1.0)
Monocytes Relative: 12 %
Neutro Abs: 7.5 10*3/uL (ref 1.7–7.7)
Neutrophils Relative %: 67 %
Platelets: 172 10*3/uL (ref 150–400)
RBC: 3.89 MIL/uL (ref 3.87–5.11)
RDW: 13.9 % (ref 11.5–15.5)
WBC: 10.9 10*3/uL — ABNORMAL HIGH (ref 4.0–10.5)
nRBC: 0.4 % — ABNORMAL HIGH (ref 0.0–0.2)

## 2019-12-31 LAB — CULTURE, BLOOD (ROUTINE X 2)
Culture: NO GROWTH
Culture: NO GROWTH

## 2019-12-31 LAB — FERRITIN: Ferritin: 241 ng/mL (ref 11–307)

## 2019-12-31 LAB — RETICULOCYTES
Immature Retic Fract: 34.9 % — ABNORMAL HIGH (ref 2.3–15.9)
RBC.: 3.85 MIL/uL — ABNORMAL LOW (ref 3.87–5.11)
Retic Count, Absolute: 64.7 10*3/uL (ref 19.0–186.0)
Retic Ct Pct: 1.7 % (ref 0.4–3.1)

## 2019-12-31 LAB — PHOSPHORUS: Phosphorus: 2.8 mg/dL (ref 2.5–4.6)

## 2019-12-31 LAB — IRON AND TIBC
Iron: 27 ug/dL — ABNORMAL LOW (ref 28–170)
Saturation Ratios: 12 % (ref 10.4–31.8)
TIBC: 227 ug/dL — ABNORMAL LOW (ref 250–450)
UIBC: 200 ug/dL

## 2019-12-31 LAB — HEMOGLOBIN A1C
Hgb A1c MFr Bld: 5.9 % — ABNORMAL HIGH (ref 4.8–5.6)
Mean Plasma Glucose: 122.63 mg/dL

## 2019-12-31 LAB — MAGNESIUM: Magnesium: 2 mg/dL (ref 1.7–2.4)

## 2019-12-31 LAB — FOLATE: Folate: 10.5 ng/mL (ref >5.9)

## 2019-12-31 LAB — PROCALCITONIN: Procalcitonin: <0.1 ng/mL

## 2019-12-31 LAB — VITAMIN B12: Vitamin B-12: 331 pg/mL (ref 180–914)

## 2019-12-31 MED ORDER — SODIUM CHLORIDE 0.9 % IV SOLN
1.0000 g | Freq: Every day | INTRAVENOUS | Status: AC
  Administered 2019-12-31 – 2020-01-04 (×5): 1 g via INTRAVENOUS
  Filled 2019-12-31 (×5): qty 1

## 2019-12-31 MED ORDER — SODIUM CHLORIDE 0.9 % IV SOLN
500.0000 mg | Freq: Every day | INTRAVENOUS | Status: DC
  Administered 2020-01-01 – 2020-01-03 (×3): 500 mg via INTRAVENOUS
  Filled 2019-12-31 (×4): qty 500

## 2019-12-31 NOTE — Progress Notes (Signed)
PROGRESS NOTE    Jamie Kelley  HYI:[id number] DOB: [birth year] DOA: 12/26/2019 PCP: Midge Minium, MD  Brief Narrative:  HPI per Dr. Jennette Kettle on 12/26/19 Jamie Kelley is a 64 y.o. female with medical history significant of HTN, HLD.  Pt seen earlier today for abd pain.  Onset 2 days ago.  Diffuse abd cramping, diarrhea.  Diarrhea is bloody.  No h/o diverticulitis nor colitis.  No fever, N/V.  Discharged home with cipro/flagyl.  Pain has persisted and pt returns to ED.  ED Course: CT earlier today shows colitis.  HGB stable at 14.  Given cipro/flagyl and hospitalist asked to admit.  **Interim History Over the last few weeks the patient was trying to lose weight and change her diet eating habits and was eating a lot of fresh salads with strep on top and smoothies.  And at times she felt on occasion does have appear to be altered.  She is found to have enteropathogenic E. coli and subsequently this morning spiked a temperature and became septic appearing.  She also went in acute hypoxic respiratory failure and a chest x-ray shows possible right base pneumonia with some cardiomegaly which is mild.  Fluid rate has been reduced and blood cultures will be obtained.  Will need to continue monitor carefully as she has a new O2 requirement and because that she met sepsis criteria this morning  12/31/19: Continues to be febrile but her abdominal pain is improving.  We will transition her off of the ciprofloxacin to IV ceftriaxone and azithromycin given that she likely has pneumonia.  Will need to continue monitor her temperature curve and WBC.  Will need to attempt to wean her oxygen requirement.  Her procalcitonin level is less than 0.10  Assessment & Plan:   Principal Problem:   Colitis presumed infectious Active Problems:   HTN (hypertension)   BRBPR (bright red blood per rectum)   Colitis  Acute colitis due to enteropathogenic E. Coli -We will change ciprofloxacin x 10 days  total to IV ceftriaxone and IV azithromycin; she is already received 5 days of ciprofloxacin and she no longer needs metronidazole.   -Overall pain is improving but still with significant cramping at times.   -Will Continue trial oral pain regimen today and if tolerates without need for further IV pain meds  -C. difficile was negative -Still has some abdominal cramping  -continue to monitor closely and continue with supportive care and continue with Bentyl 10 mg p.o. every 6 as needed for spasms, acetaminophen, 650 mg p.o./RC every 6 as needed for mild pain or fever, Oxycodone 5 mg p.o. every 4h  For breakthrough pain, Morphine 2 to 4 mg IV every 4 as needed for severe pain, antiemetics with ondansetron 4 mg p.o./IV every 6 as needed for nausea as well as -Continued with full liquid diet and advance slowly to SOFT this AM  Hypokalemia -due to diarrhea, repleted -Potassium is now 3.9 -Continue to monitor and replete as necessary -Check magnesium Level daily  -Repeat CMP in a.m.  Severe Sepsis from Enteropathogenic E. coli with possible concomitant right basilar pneumonia -Was febrile the night before last with a TMax of 101.2, Tachycardic, Has a Leukocytosis (13.7 and trending down to 11.4) and a Source of Infection with Enteropathogenic E Coli and went into Acute Respiratory Failure with Hypoxia; she continues to remain febrile and had a temperature of 101.3 and remains on supplemental oxygen -Give 1 Liter NS Bolus; C/w LR at 100 mL/hr and reduce to  75 mL/hr for 12 hours -Lactic acid level on admission was 1.0 and repeat this morning was 0.7 and further repeat was 0.8 -Procalcitonin level was less than 0.10 x2 -Patient's WBC is trending downward from 13.7 -> 12.5 -> 11.4 and today it is 10.9 -ESR was 20 -Check blood cultures and initially they were negative and repeat showing no growth to date less than 24 hours  Essential Hypertension -Blood pressures were a little elevated this morning at  151/85 -We will continue to monitor blood pressures per protocol -If necessary will add as needed hydralazine  Acute Respiratory Failure with Hypoxia in the setting of above and possible pneumonia -In the setting of As above -12/30/2019 check DG Chest X-Ray and showed "Patchy mainly linear airspace opacities at the RIGHT lung base, may represent atelectasis or early infection. Mild cardiomegaly and central pulmonary vascular engorgement." -P chest x-ray this morning showed "Stable cardiomegaly. No pneumothorax or pleural effusion is noted.Left lung is clear. Mild right basilar subsegmental atelectasis is noted. Bony thorax is unremarkable." -Noted to be hypoxic on the a.m. of 11/3 and his O2 saturation's on 86% on room air -Flutter Valve and Incentive Spirometry -SpO2: 96 % O2 Flow Rate (L/min): 2 L/min  -Check BNP and if necessary will obtain an echocardiogram; patient is positive 3.145 L since admission -Have Added Flutter Valve and Incentive Spirometry  -Continuous Pulse Oximetry and maintain O2 saturations greater than 90% -Continue supplemental oxygen via nasal cannula and wean O2 as tolerated -We will need an ambulatory home O2 screen prior to discharge -Repeat chest x-ray in a.m. and if still not improving may consider CT scan of the chest  Hyperglcyemia in the setting of New onset Pre-Diabetes -HbA1c was 5.9 -Patient's blood sugar has been ranging from 113-130 daily on BMP/CMP  -Continue monitor blood sugars carefully and if necessary will place on sensitive NovoLog/scale insulin AC  Normocytic Anemia -Patient's hemoglobin/hematocrit went from 13.1/39.0 -> 12.4/37.8 -> 11.9/35.5 -> 11.8/35.8 -Checked Anemia panel and showed an iron level of 27, U IBC of 200, TIBC of 227, saturation ratio 12%, ferritin level 241, folate level of 10.5, vitamin B12 of 331 -likely this is dilutional drop in the setting of IV fluid resuscitation -Continue monitor for signs and symptoms of bleeding;  currently no overt bleeding noted -Repeat CBC in a.m.  Hyponatremia -Mild -Patient's sodium dropped from 137 is now 134 -If not improving will consider resuming IV fluid hydration -Continue monitor and trend and repeat CMP in a.m.  Obesity -Complicates overall Prognosis and Care -Estimated body mass index is 39.48 kg/m as calculated from the following:   Height as of this encounter: 5' (1.524 m).   Weight as of this encounter: 91.7 kg. -Weight Loss and Dietary Counseling given  DVT prophylaxis: SCDs Code Status: FULL CODE Family Communication: No family presently at bedside Disposition Plan: Pending further clinical improvement and weaning of supplemental oxygen.  Status is: Inpatient  Remains inpatient appropriate because:Unsafe d/c plan, IV treatments appropriate due to intensity of illness or inability to take PO and Inpatient level of care appropriate due to severity of illness   Dispo: The patient is from: Home              Anticipated d/c is to: Home              Anticipated d/c date is: 2 days              Patient currently is not medically stable to d/c.  Consultants:  Gastroenterology    Procedures: None  Antimicrobials: Anti-infectives (From admission, onward)   Start     Dose/Rate Route Frequency Ordered Stop   01/01/20 1000  azithromycin (ZITHROMAX) 500 mg in sodium chloride 0.9 % 250 mL IVPB        500 mg 250 mL/hr over 60 Minutes Intravenous Daily 12/31/19 1034 01/05/20 0959   12/31/19 1200  cefTRIAXone (ROCEPHIN) 1 g in sodium chloride 0.9 % 100 mL IVPB        1 g 200 mL/hr over 30 Minutes Intravenous Daily 12/31/19 1034 01/05/20 0959   12/27/19 1000  ciprofloxacin (CIPRO) IVPB 400 mg  Status:  Discontinued        400 mg 200 mL/hr over 60 Minutes Intravenous Every 12 hours 12/26/19 2310 12/31/19 1034   12/27/19 0600  metroNIDAZOLE (FLAGYL) IVPB 500 mg  Status:  Discontinued        500 mg 100 mL/hr over 60 Minutes Intravenous Every 8 hours 12/26/19  2304 12/29/19 0845   12/26/19 2145  ciprofloxacin (CIPRO) IVPB 400 mg        400 mg 200 mL/hr over 60 Minutes Intravenous  Once 12/26/19 2132 12/27/19 0025   12/26/19 2145  metroNIDAZOLE (FLAGYL) IVPB 500 mg        500 mg 100 mL/hr over 60 Minutes Intravenous  Once 12/26/19 2132 12/27/19 0014     Subjective: Seen and examined at bedside and thinks her abdominal discomfort is doing better but she continues to have a temperature and remained on supplemental oxygen.  No nausea or vomiting.  No chest pain. Does not feel short of breath.  No other concerns or complaints at this time.  Objective: Vitals:   12/30/19 1216 12/30/19 1636 12/30/19 2012 12/31/19 0659  BP: (!) 151/85 (!) 149/83 (!) 143/81 137/77  Pulse: 97 86 83 91  Resp: _0 Temp: 99.4 F (37.4 C) 99.5 F (37.5 C) 99.6 F (37.6 C) (!) 101.3 F (38.5 C)  TempSrc: Oral Oral Oral Oral  SpO2: 96% 95% 98% 96%  Weight:      Height:        Intake/Output Summary (Last 24 hours) at 12/31/2019 1248 Last data filed at 12/31/2019 1041 Gross per 24 hour  Intake 1462.47 ml  Output 2775 ml  Net -1312.53 ml   Filed Weights   12/26/19 1935 12/27/19 0137  Weight: 90.7 kg 91.7 kg   Examination: Physical Exam:  Constitutional: WN/WD obese AAF ub NAD and appears calm and a little more comfortable than yesterday Eyes: Lids and conjunctivae normal, sclerae anicteric  ENMT: External Ears, Nose appear normal. Grossly normal hearing.  Neck: Appears normal, supple, no cervical masses, normal ROM, no appreciable thyromegaly; no JVD Respiratory: Diminished to auscultation bilaterally, no wheezing, rales, rhonchi or crackles. Normal respiratory effort and patient is not tachypenic. No accessory muscle use.  Cardiovascular: RRR, no murmurs / rubs / gallops. S1 and S2 auscultated. Mild extremity edema.  Abdomen: Soft, Tender to palpate, Distended 2/2 to body habitus. Bowel sounds positive.  GU: Deferred. Musculoskeletal: No clubbing /  cyanosis of digits/nails. No joint deformity upper and lower extremities.  Skin: No rashes, lesions, ulcers on a limited skin evaluation. No induration; Warm and dry.  Neurologic: CN 2-12 grossly intact with no focal deficits. Romberg sign and cerebellar reflexes not assessed.  Psychiatric: Normal judgment and insight. Alert and oriented x 3. Normal mood and appropriate affect.   Data Reviewed: I have personally reviewed following labs and imaging studies  CBC: Recent Labs  Lab 12/26/19 0611 12/26/19 3903 12/26/19 2203 12/27/19 0603 12/28/19 0446 12/30/19 0821 12/31/19 0518  WBC 9.9   < > 13.8* 13.6*  13.7* 12.5* 11.4* 10.9*  NEUTROABS 7.5  --  11.7* 10.9*  --  7.9* 7.5  HGB 14.3   < > 14.4 12.9  13.1 12.4 11.9* 11.8*  HCT 42.1   < > 43.1 38.9  39.0 37.8 35.5* 35.8*  MCV 90.3   < > 91.3 92.0  91.1 92.2 90.3 92.0  PLT 189   < > 187 161  157 153 158 172   < > = values in this interval not displayed.   Basic Metabolic Panel: Recent Labs  Lab 12/26/19 2203 12/27/19 0603 12/28/19 0446 12/30/19 0821 12/31/19 0518  NA 141 137 135 137 134*  K 3.5 3.1* 3.8 3.7 3.9  CL 104 104 101 99 97*  CO2 _0 GLUCOSE 135* 130* 124* 113* 107*  BUN 7* 5* <5* <5* <5*  CREATININE 0.63 0.52 0.54 0.60 0.48  CALCIUM 9.1 8.2* 8.3* 8.4* 8.3*  MG  --   --   --  1.9 2.0  PHOS  --   --   --  2.6 2.8   GFR: Estimated Creatinine Clearance: 73.7 mL/min (by C-G formula based on SCr of 0.48 mg/dL). Liver Function Tests: Recent Labs  Lab 12/26/19 0611 12/26/19 2203 12/28/19 0446 12/30/19 0821 12/31/19 0518  AST 22 19 13* 18 19  ALT _1 ALKPHOS 64 68 48 50 47  BILITOT 0.6 0.9 0.7 0.9 0.7  PROT 7.6 7.7 5.9* 5.9* 5.7*  ALBUMIN 4.4 4.3 3.1* 3.1* 3.0*   Recent Labs  Lab 12/26/19 0611  LIPASE 30   No results for input(s): AMMONIA in the last 168 hours. Coagulation Profile: No results for input(s): INR, PROTIME in the last 168 hours. Cardiac Enzymes: No results for  input(s): CKTOTAL, CKMB, CKMBINDEX, TROPONINI in the last 168 hours. BNP (last 3 results) No results for input(s): PROBNP in the last 8760 hours. HbA1C: Recent Labs    12/31/19 0521  HGBA1C 5.9*   CBG: No results for input(s): GLUCAP in the last 168 hours. Lipid Profile: No results for input(s): CHOL, HDL, LDLCALC, TRIG, CHOLHDL, LDLDIRECT in the last 72 hours. Thyroid Function Tests: No results for input(s): TSH, T4TOTAL, FREET4, T3FREE, THYROIDAB in the last 72 hours. Anemia Panel: Recent Labs    12/31/19 0521  VITAMINB12 331  FOLATE 10.5  FERRITIN 241  TIBC 227*  IRON 27*  RETICCTPCT 1.7   Sepsis Labs: Recent Labs  Lab 12/26/19 2203 12/27/19 0040 12/30/19 0821 12/30/19 1047 12/31/19 0518  PROCALCITON  --   --  <0.10  --  <0.10  LATICACIDVEN 1.0 1.0 0.7 0.8  --     Recent Results (from the past 240 hour(s))  Blood culture (routine x 2)     Status: None   Collection Time: 12/26/19 10:03 PM   Specimen: BLOOD  Result Value Ref Range Status   Specimen Description   Final    BLOOD RIGHT ARM Performed at [facility] [address], [place], [place] [address]    Special Requests   Final    BOTTLES DRAWN AEROBIC AND ANAEROBIC Blood Culture results may not be optimal due to an inadequate volume of blood received in culture bottles Performed at [facility] [address], [place], [place] [address]    Culture   Final  NO GROWTH 5 DAYS Performed at [facility], [place] [address] [place], [place] [address]    Report Status 12/31/2019 FINAL  Final  Blood culture (routine x 2)     Status: None   Collection Time: 12/26/19 10:03 PM   Specimen: BLOOD  Result Value Ref Range Status   Specimen Description   Final    BLOOD LEFT ARM Performed at [facility] [address] [place], [place] [address]    Special Requests   Final    BOTTLES DRAWN AEROBIC AND ANAEROBIC Blood Culture results may not be  optimal due to an inadequate volume of blood received in culture bottles Performed at [facility] [address], [place], [place] [address]    Culture   Final    NO GROWTH 5 DAYS Performed at [facility], [place] [address], [place], [place] [address]    Report Status 12/31/2019 FINAL  Final  Gastrointestinal Panel by PCR , Stool     Status: Abnormal   Collection Time: 12/26/19 11:33 PM   Specimen: Stool  Result Value Ref Range Status   Campylobacter species NOT DETECTED NOT DETECTED Final   Plesimonas shigelloides NOT DETECTED NOT DETECTED Final   Salmonella species NOT DETECTED NOT DETECTED Final   Yersinia enterocolitica NOT DETECTED NOT DETECTED Final   Vibrio species NOT DETECTED NOT DETECTED Final   Vibrio cholerae NOT DETECTED NOT DETECTED Final   Enteroaggregative E coli (EAEC) NOT DETECTED NOT DETECTED Final   Enteropathogenic E coli (EPEC) DETECTED (A) NOT DETECTED Final    Comment: RESULT CALLED TO, READ BACK BY AND VERIFIED WITH: JT TAYLOR _0  12/28/19 MJU    Enterotoxigenic E coli (ETEC) NOT DETECTED NOT DETECTED Final   Shiga like toxin producing E coli (STEC) NOT DETECTED NOT DETECTED Final   Shigella/Enteroinvasive E coli (EIEC) NOT DETECTED NOT DETECTED Final   Cryptosporidium NOT DETECTED NOT DETECTED Final   Cyclospora cayetanensis NOT DETECTED NOT DETECTED Final   Entamoeba histolytica NOT DETECTED NOT DETECTED Final   Giardia lamblia NOT DETECTED NOT DETECTED Final   Adenovirus F40/41 NOT DETECTED NOT DETECTED Final   Astrovirus NOT DETECTED NOT DETECTED Final   Norovirus GI/GII NOT DETECTED NOT DETECTED Final   Rotavirus A NOT DETECTED NOT DETECTED Final   Sapovirus (I, II, IV, and V) NOT DETECTED NOT DETECTED Final    Comment: Performed at [facility], [place] [place], [place] [address]  Respiratory Panel by RT PCR (Flu A&B, Covid) - Nasopharyngeal Swab     Status: None   Collection Time: 12/27/19 12:20 AM    Specimen: Nasopharyngeal Swab  Result Value Ref Range Status   SARS Coronavirus 2 by RT PCR NEGATIVE NEGATIVE Final    Comment: (NOTE) SARS-CoV-2 target nucleic acids are NOT DETECTED.  The SARS-CoV-2 RNA is generally detectable in upper respiratoy specimens during the acute phase of infection. The lowest concentration of SARS-CoV-2 viral copies this assay can detect is 131 copies/mL. A negative result does not preclude SARS-Cov-2 infection and should not be used as the sole basis for treatment or other patient management decisions. A negative result may occur with  improper specimen collection/handling, submission of specimen other than nasopharyngeal swab, presence of viral mutation(s) within the areas targeted by this assay, and inadequate number of viral copies (<131 copies/mL). A negative result must be combined with clinical observations, patient history, and epidemiological information. The expected result is Negative.  Fact Sheet for Patients:  PinkCheek.be  Fact Sheet for Healthcare Providers:  GravelBags.it  This test is no t yet approved or cleared by the [place] FDA and  has been authorized for detection and/or diagnosis of SARS-CoV-2 by FDA under an Emergency Use Authorization (EUA). This EUA will remain  in effect (meaning this test can be used) for the duration of the COVID-19 declaration under Section 564(b)(1) of the Act, 21 U.S.C. section 360bbb-3(b)(1), unless the authorization is terminated or revoked sooner.     Influenza A by PCR NEGATIVE NEGATIVE Final   Influenza B by PCR NEGATIVE NEGATIVE Final    Comment: (NOTE) The Xpert Xpress SARS-CoV-2/FLU/RSV assay is intended as an aid in  the diagnosis of influenza from Nasopharyngeal swab specimens and  should not be used as a sole basis for treatment. Nasal washings and  aspirates are unacceptable for Xpert Xpress SARS-CoV-2/FLU/RSV   testing.  Fact Sheet for Patients: PinkCheek.be  Fact Sheet for Healthcare Providers: GravelBags.it  This test is not yet approved or cleared by the [place] FDA and  has been authorized for detection and/or diagnosis of SARS-CoV-2 by  FDA under an Emergency Use Authorization (EUA). This EUA will remain  in effect (meaning this test can be used) for the duration of the  Covid-19 declaration under Section 564(b)(1) of the Act, 21  U.S.C. section 360bbb-3(b)(1), unless the authorization is  terminated or revoked. Performed at [facility], [place] [address], [place], [place] [address]   C Difficile Quick Screen w PCR reflex     Status: None   Collection Time: 12/27/19 12:13 PM   Specimen: STOOL  Result Value Ref Range Status   C Diff antigen NEGATIVE NEGATIVE Final   C Diff toxin NEGATIVE NEGATIVE Final   C Diff interpretation No C. difficile detected.  Final    Comment: Performed at [facility], [place] [address] [place], [place] [address]  Culture, blood (routine x 2)     Status: None (Preliminary result)   Collection Time: 12/30/19 12:47 PM   Specimen: BLOOD LEFT HAND  Result Value Ref Range Status   Specimen Description   Final    BLOOD LEFT HAND Performed at [facility] [address], [place], [place] [address]    Special Requests   Final    BOTTLES DRAWN AEROBIC AND ANAEROBIC Blood Culture adequate volume Performed at [facility] [address] [place] [address], [place], [place] [address]    Culture   Final    NO GROWTH < 24 HOURS Performed at [facility] [address], [place], [place] [address]    Report Status PENDING  Incomplete  Culture, blood (routine x 2)     Status: None (Preliminary result)   Collection Time: 12/30/19 12:48 PM   Specimen: BLOOD  Result Value Ref Range Status   Specimen Description   Final    BLOOD RIGHT  ANTECUBITAL Performed at [facility] [address], [place], [place] [address]    Special Requests   Final    BOTTLES DRAWN AEROBIC AND ANAEROBIC Blood Culture adequate volume Performed at [facility] [address] [place], [place] [address]    Culture   Final    NO GROWTH < 24 HOURS Performed at [facility] [address] [place], [place] [address]    Report Status PENDING  Incomplete   RN Pressure Injury Documentation:     Estimated body mass index is 39.48 kg/m  as calculated from the following:   Height as of this encounter: 5' (1.524 m).   Weight as of this encounter: 91.7 kg.  Malnutrition Type:      Malnutrition Characteristics:      Nutrition Interventions:    Radiology Studies: DG CHEST PORT 1 VIEW  Result Date: 12/31/2019 CLINICAL DATA:  Fever. EXAM: PORTABLE CHEST 1 VIEW COMPARISON:  December 30, 2019. FINDINGS: Stable cardiomegaly. No pneumothorax or pleural effusion is noted. Left lung is clear. Mild right basilar subsegmental atelectasis is noted. Bony thorax is unremarkable. IMPRESSION: Mild right basilar subsegmental atelectasis. Electronically Signed   By: Marijo Conception M.D.   On: 12/31/2019 08:26   DG CHEST PORT 1 VIEW  Result Date: 12/30/2019 CLINICAL DATA:  64 year old with history of hypertension question of colitis on recent CT EXAM: PORTABLE CHEST 1 VIEW COMPARISON:  11/24/2008 FINDINGS: Low normal lung volumes. Mild cardiac enlargement accentuated by low lung volumes. Mild central pulmonary vascular engorgement. Mild elevation of the RIGHT hemidiaphragm relative to the LEFT is similar to what is seen on the recent CT evaluation. Patchy mainly linear airspace opacities at the RIGHT lung base. No lobar consolidative changes. No sign of pleural effusion. On limited assessment no acute skeletal process. IMPRESSION: Patchy mainly linear airspace opacities at the RIGHT lung base, may represent atelectasis  or early infection. Mild cardiomegaly and central pulmonary vascular engorgement. Electronically Signed   By: Zetta Bills M.D.   On: 12/30/2019 10:40   Scheduled Meds: Continuous Infusions: . [START ON 01/01/2020] azithromycin    . cefTRIAXone (ROCEPHIN)  IV 1 g (12/31/19 1206)   Raiford Noble, DO [facility] PAGER is on [employer]  If 7PM-7AM, please contact night-coverage [email or web address]

## 2019-12-31 NOTE — Plan of Care (Signed)
  Problem: Clinical Measurements: Goal: Ability to maintain clinical measurements within normal limits will improve Outcome: Progressing   Problem: Activity: Goal: Risk for activity intolerance will decrease Outcome: Progressing   Problem: Pain Managment: Goal: General experience of comfort will improve Outcome: Progressing   

## 2020-01-01 ENCOUNTER — Inpatient Hospital Stay (HOSPITAL_COMMUNITY): Payer: No Typology Code available for payment source

## 2020-01-01 ENCOUNTER — Encounter (HOSPITAL_COMMUNITY): Payer: Self-pay | Admitting: Internal Medicine

## 2020-01-01 ENCOUNTER — Telehealth: Payer: Self-pay | Admitting: Registered Nurse

## 2020-01-01 DIAGNOSIS — I2699 Other pulmonary embolism without acute cor pulmonale: Secondary | ICD-10-CM

## 2020-01-01 DIAGNOSIS — A040 Enteropathogenic Escherichia coli infection: Secondary | ICD-10-CM

## 2020-01-01 HISTORY — DX: Other pulmonary embolism without acute cor pulmonale: I26.99

## 2020-01-01 LAB — CBC WITH DIFFERENTIAL/PLATELET
Abs Immature Granulocytes: 0.14 10*3/uL — ABNORMAL HIGH (ref 0.00–0.07)
Basophils Absolute: 0.1 10*3/uL (ref 0.0–0.1)
Basophils Relative: 1 %
Eosinophils Absolute: 0.2 10*3/uL (ref 0.0–0.5)
Eosinophils Relative: 2 %
HCT: 36.4 % (ref 36.0–46.0)
Hemoglobin: 12.2 g/dL (ref 12.0–15.0)
Immature Granulocytes: 1 %
Lymphocytes Relative: 13 %
Lymphs Abs: 1.6 10*3/uL (ref 0.7–4.0)
MCH: 30.3 pg (ref 26.0–34.0)
MCHC: 33.5 g/dL (ref 30.0–36.0)
MCV: 90.3 fL (ref 80.0–100.0)
Monocytes Absolute: 1.3 10*3/uL — ABNORMAL HIGH (ref 0.1–1.0)
Monocytes Relative: 11 %
Neutro Abs: 8.5 10*3/uL — ABNORMAL HIGH (ref 1.7–7.7)
Neutrophils Relative %: 72 %
Platelets: 195 10*3/uL (ref 150–400)
RBC: 4.03 MIL/uL (ref 3.87–5.11)
RDW: 13.8 % (ref 11.5–15.5)
WBC: 11.9 10*3/uL — ABNORMAL HIGH (ref 4.0–10.5)
nRBC: 0 % (ref 0.0–0.2)

## 2020-01-01 LAB — COMPREHENSIVE METABOLIC PANEL
ALT: 16 U/L (ref 0–44)
AST: 19 U/L (ref 15–41)
Albumin: 3.1 g/dL — ABNORMAL LOW (ref 3.5–5.0)
Alkaline Phosphatase: 48 U/L (ref 38–126)
Anion gap: 11 (ref 5–15)
BUN: 7 mg/dL — ABNORMAL LOW (ref 8–23)
CO2: 26 mmol/L (ref 22–32)
Calcium: 8.5 mg/dL — ABNORMAL LOW (ref 8.9–10.3)
Chloride: 96 mmol/L — ABNORMAL LOW (ref 98–111)
Creatinine, Ser: 0.53 mg/dL (ref 0.44–1.00)
GFR, Estimated: >60 mL/min (ref >60)
Glucose, Bld: 109 mg/dL — ABNORMAL HIGH (ref 70–99)
Potassium: 3.9 mmol/L (ref 3.5–5.1)
Sodium: 133 mmol/L — ABNORMAL LOW (ref 135–145)
Total Bilirubin: 0.8 mg/dL (ref 0.3–1.2)
Total Protein: 6.4 g/dL — ABNORMAL LOW (ref 6.5–8.1)

## 2020-01-01 LAB — BRAIN NATRIURETIC PEPTIDE: B Natriuretic Peptide: 51.4 pg/mL (ref 0.0–100.0)

## 2020-01-01 LAB — PHOSPHORUS: Phosphorus: 3.7 mg/dL (ref 2.5–4.6)

## 2020-01-01 LAB — MAGNESIUM: Magnesium: 2.3 mg/dL (ref 1.7–2.4)

## 2020-01-01 MED ORDER — SODIUM CHLORIDE 0.9 % IV BOLUS
500.0000 mL | Freq: Once | INTRAVENOUS | Status: AC
  Administered 2020-01-01: 500 mL via INTRAVENOUS

## 2020-01-01 MED ORDER — SODIUM CHLORIDE (PF) 0.9 % IJ SOLN
INTRAMUSCULAR | Status: AC
  Filled 2020-01-01: qty 50

## 2020-01-01 MED ORDER — HEPARIN (PORCINE) 25000 UT/250ML-% IV SOLN
1450.0000 [IU]/h | INTRAVENOUS | Status: DC
  Administered 2020-01-02: 1200 [IU]/h via INTRAVENOUS
  Administered 2020-01-02: 1100 [IU]/h via INTRAVENOUS
  Administered 2020-01-03: 1300 [IU]/h via INTRAVENOUS
  Filled 2020-01-01 (×3): qty 250

## 2020-01-01 MED ORDER — IOHEXOL 350 MG/ML SOLN
100.0000 mL | Freq: Once | INTRAVENOUS | Status: AC | PRN
  Administered 2020-01-01: 100 mL via INTRAVENOUS

## 2020-01-01 MED ORDER — IPRATROPIUM BROMIDE 0.02 % IN SOLN
0.5000 mg | Freq: Four times a day (QID) | RESPIRATORY_TRACT | Status: DC
  Filled 2020-01-01: qty 2.5

## 2020-01-01 MED ORDER — FUROSEMIDE 10 MG/ML IJ SOLN
40.0000 mg | Freq: Once | INTRAMUSCULAR | Status: AC
  Administered 2020-01-01: 40 mg via INTRAVENOUS
  Filled 2020-01-01: qty 4

## 2020-01-01 MED ORDER — IPRATROPIUM BROMIDE 0.02 % IN SOLN: 0.5000 mg | Freq: Four times a day (QID) | RESPIRATORY_TRACT | Status: DC

## 2020-01-01 MED ORDER — LEVALBUTEROL HCL 0.63 MG/3ML IN NEBU: 0.6300 mg | INHALATION_SOLUTION | Freq: Four times a day (QID) | RESPIRATORY_TRACT | Status: DC

## 2020-01-01 MED ORDER — GUAIFENESIN ER 600 MG PO TB12
1200.0000 mg | ORAL_TABLET | Freq: Two times a day (BID) | ORAL | Status: DC
  Administered 2020-01-01 – 2020-01-04 (×7): 1200 mg via ORAL
  Filled 2020-01-01 (×7): qty 2

## 2020-01-01 MED ORDER — LEVALBUTEROL HCL 0.63 MG/3ML IN NEBU
0.6300 mg | INHALATION_SOLUTION | Freq: Four times a day (QID) | RESPIRATORY_TRACT | Status: DC
  Filled 2020-01-01: qty 3

## 2020-01-01 MED ORDER — HEPARIN BOLUS VIA INFUSION
4000.0000 [IU] | Freq: Once | INTRAVENOUS | Status: AC
  Administered 2020-01-02: 4000 [IU] via INTRAVENOUS
  Filled 2020-01-01: qty 4000

## 2020-01-01 NOTE — Plan of Care (Signed)
  Problem: Activity: Goal: Risk for activity intolerance will decrease Outcome: Progressing   Problem: Nutrition: Goal: Adequate nutrition will be maintained Outcome: Progressing   Problem: Pain Managment: Goal: General experience of comfort will improve Outcome: Progressing   

## 2020-01-01 NOTE — Progress Notes (Signed)
ANTICOAGULATION CONSULT NOTE - Initial Consult  Pharmacy Consult for heparin Indication: pulmonary embolus  No Known Allergies  Patient Measurements: Height: 5' (152.4 cm) Weight: 91.7 kg (202 lb 2.6 oz) IBW/kg (Calculated) : 45.5 Heparin Dosing Weight: 67kg  Vital Signs: Temp: 99.2 F (37.3 C) (11/05 2246) Temp Source: Oral (11/05 2246) BP: 134/85 (11/05 2246) Pulse Rate: 97 (11/05 2246)  Labs: Recent Labs    12/30/19 0821 12/30/19 0821 12/31/19 0518 01/01/20 0533  HGB 11.9*   < > 11.8* 12.2  HCT 35.5*  --  35.8* 36.4  PLT 158  --  172 195  CREATININE 0.60  --  0.48 0.53   < > = values in this interval not displayed.    Estimated Creatinine Clearance: 73.7 mL/min (by C-G formula based on SCr of 0.53 mg/dL).   Medical History: Past Medical History:  Diagnosis Date  . Arthritis   . Hay fever   . High blood pressure   . Hyperlipidemia   . Hypothyroidism   . Obesity   . Thyroid disease      Assessment: 63 y.o.femalewith medical history significant ofHTN, HLD.  Pt seen for abd pain and treated for E.Coli.  Pt also diagnosed with PNA.  Pharmacy consulted to dose heparin drip for PE.  No prior AC noted.  01/01/2020  CBC WNL Scr WNL Baseline labs ordered STAT   Goal of Therapy:  Heparin level 0.3-0.7 units/ml Monitor platelets by anticoagulation protocol   Plan:  Heparin bolus 4000 units x 1 then start Heparin drip at 1100 units/hr Heparin level in 6 hours Daily CBC   Dolly Rias RPh 01/01/2020, 11:15 PM

## 2020-01-01 NOTE — Progress Notes (Addendum)
   01/01/20 1817  Assess: MEWS Score  Temp (!) 102.7 F (39.3 C)  BP (!) 145/81  Pulse Rate (!) 115  SpO2 (!) 88 %  Assess: MEWS Score  MEWS Temp 2  MEWS Systolic 0  MEWS Pulse 2  MEWS RR 0  MEWS LOC 0  MEWS Score 4  MEWS Score Color Red  Assess: if the MEWS score is Yellow or Red  Were vital signs taken at a resting state? Yes  Focused Assessment No change from prior assessment  Early Detection of Sepsis Score *See Row Information* High  MEWS guidelines implemented *See Row Information* Yes  Take Vital Signs  Increase Vital Sign Frequency  Red: Q 1hr X 4 then Q 4hr X 4, if remains red, continue Q 4hrs  Escalate  MEWS: Escalate Red: discuss with charge nurse/RN and provider, consider discussing with RRT  Notify: Charge Nurse/RN  Name of Charge Nurse/RN Notified Chancy Hurter, RN  Date Charge Nurse/RN Notified 01/01/20  Time Charge Nurse/RN Notified 1817  Notify: Provider  Provider Name/Title [facility]  Date Provider Notified 01/01/20  Time Provider Notified 1825  Notification Type Page  Notification Reason Change in status  Response See new orders  Date of Provider Response 01/01/20  Time of Provider Response 1830  RR 21

## 2020-01-01 NOTE — Progress Notes (Signed)
PROGRESS NOTE    Jamie Kelley  UJW:[id number] DOB: 02-24-1956 DOA: 12/26/2019 PCP: Midge Minium, MD  Brief Narrative:  HPI per Dr. Jennette Kettle on 12/26/19 Jamie Kelley is a 64 y.o. female with medical history significant of HTN, HLD.  Pt seen earlier today for abd pain.  Onset 2 days ago.  Diffuse abd cramping, diarrhea.  Diarrhea is bloody.  No h/o diverticulitis nor colitis.  No fever, N/V.  Discharged home with cipro/flagyl.  Pain has persisted and pt returns to ED.  ED Course: CT earlier today shows colitis.  HGB stable at 14.  Given cipro/flagyl and hospitalist asked to admit.  **Interim History Over the last few weeks the patient was trying to lose weight and change her diet eating habits and was eating a lot of fresh salads with strep on top and smoothies.  And at times she felt on occasion does have appear to be altered.  She is found to have enteropathogenic E. coli and subsequently this morning spiked a temperature and became septic appearing.  She also went in acute hypoxic respiratory failure and a chest x-ray shows possible right base pneumonia with some cardiomegaly which is mild.  Fluid rate has been reduced and blood cultures will be obtained.  Will need to continue monitor carefully as she has a new O2 requirement and because that she met sepsis criteria this morning  12/31/19: Continues to be febrile but her abdominal pain is improving.  We will transition her off of the ciprofloxacin to IV ceftriaxone and azithromycin given that she likely has pneumonia.  Will need to continue monitor her temperature curve and WBC.  Will need to attempt to wean her oxygen requirement.  Her procalcitonin level is less than 0.10  01/01/20: Still remains febrile and WBC slightly elevated. Will continue IV Abx and OOB and Ambulate.   Assessment & Plan:   Principal Problem:   Colitis presumed infectious Active Problems:   HTN (hypertension)   BRBPR (bright red blood per  rectum)   Colitis  Acute colitis due to enteropathogenic E. Coli -We will change ciprofloxacin x 10 days total to IV ceftriaxone and IV azithromycin and this was done 12/31/19; she is already received 5 days of ciprofloxacin and she no longer needs metronidazole.   -Overall pain is improving but still with significant cramping at times.   -Will Continue trial oral pain regimen today and if tolerates without need for further IV pain meds  -C. difficile was negative -Still has some abdominal cramping but it is improving  -continue to monitor closely and continue with supportive care and continue with Bentyl 10 mg p.o. every 6 as needed for spasms, acetaminophen, 650 mg p.o./RC every 6 as needed for mild pain or fever, Oxycodone 5 mg p.o. every 4h  For breakthrough pain, Morphine 2 to 4 mg IV every 4 as needed for severe pain, antiemetics with ondansetron 4 mg p.o./IV every 6 as needed for nausea as well as -Continued with full liquid diet and advance slowly to SOFT and this was done yesterday; Tolerating die   Hypokalemia -due to diarrhea, repleted -Potassium is now 3.9 again  -Continue to monitor and replete as necessary -Check magnesium Level daily  -Repeat CMP in a.m.  Severe Sepsis from Enteropathogenic E. coli with possible concomitant Right Basilar pneumonia -Was febrile the 12/29/19 with a TMax of 101.2, Tachycardic, Has a Leukocytosis (13.7 and trending down to 10.9 but slightly bumped to 11.9) and a Source of Infection with Enteropathogenic E  Coli and went into Acute Respiratory Failure with Hypoxia; she continues to remain febrile and had a temperature of 101.3 yesterday and today her fever was 100.6 and remains on supplemental oxygen -Give 1 Liter NS Bolus; C/w LR at 100 mL/hr and reduce to 75 mL/hr for 12 hours and IVF has now stopped  -Lactic acid level on admission was 1.0 and repeat this morning was 0.7 and further repeat was 0.8 -Procalcitonin level was less than 0.10  x2 -Patient's WBC is trending downward from 13.7 -> 12.5 -> 11.4 -> 10.9 -> 11.9 -ESR was 20 -Check blood cultures and initially they were negative and repeat showing no growth to date at 2 Days -Repeat CXR this AM showed "Cardiomegaly. No pulmonary venous congestion. Low lung volumes with bibasilar atelectasis. Mild right perihilar and right base infiltrates cannot be excluded on today's exam."  Essential Hypertension -Blood pressures this AM was stable at 139/78 -We will continue to monitor blood pressures per protocol -If necessary will add as needed hydralazine  Acute Respiratory Failure with Hypoxia in the setting of above and possible pneumonia -In the setting of As above -12/30/2019 check DG Chest X-Ray and showed "Patchy mainly linear airspace opacities at the RIGHT lung base, may represent atelectasis or early infection. Mild cardiomegaly and central pulmonary vascular engorgement." -Repeat chest x-ray this morning showed "Cardiomegaly. No pulmonary venous congestion. Low lung volumes with bibasilar atelectasis. Mild right perihilar and right base infiltrates cannot be excluded on today's exam." -Noted to be hypoxic on the a.m. of 11/3 and his O2 saturation's on 86% on room air -Flutter Valve and Incentive Spirometry -SpO2: 94 % O2 Flow Rate (L/min): 2 L/min  -Added guaifenesin 1200 mg p.o. twice daily, as well as Ipratropium and Levalbuterol nebs every 6 hours -Check BNP and was 51.4 and if necessary will obtain an echocardiogram; patient is positive 2.645 L since admission and will try an empiric dose of IV Lasix 40 mg x1 -Have Added Flutter Valve and Incentive Spirometry  -Continuous Pulse Oximetry and maintain O2 saturations greater than 90% -Continue supplemental oxygen via nasal cannula and wean O2 as tolerated -We will need an ambulatory home O2 screen prior to discharge -Repeat chest x-ray in a.m. and if still not improving may consider CT scan of the chest  Hyperglcyemia  in the setting of New onset Pre-Diabetes -HbA1c was 5.9 -Patient's blood sugar has been ranging from 113-130 daily on BMP/CMP; Blood Sugar this AM was 109 -Continue monitor blood sugars carefully and if necessary will place on sensitive NovoLog/scale insulin AC  Normocytic Anemia -Patient's hemoglobin/hematocrit went from 13.1/39.0 -> 12.4/37.8 -> 11.9/35.5 -> 11.8/35.8 -> 12.2/36.4 -Checked Anemia panel and showed an iron level of 27, U IBC of 200, TIBC of 227, saturation ratio 12%, ferritin level 241, folate level of 10.5, vitamin B12 of 331 -likely this is dilutional drop in the setting of IV fluid resuscitation -Continue monitor for signs and symptoms of bleeding; currently no overt bleeding noted -Repeat CBC in a.m.  Hyponatremia -Mild -Patient's sodium dropped from 137 is now 133 -If not improving will consider resuming IV fluid hydration -Continue monitor and trend and repeat CMP in a.m.  Obesity -Complicates overall Prognosis and Care -Estimated body mass index is 39.48 kg/m as calculated from the following:   Height as of this encounter: 5' (1.524 m).   Weight as of this encounter: 91.7 kg. -Weight Loss and Dietary Counseling given  DVT prophylaxis: SCDs Code Status: FULL CODE Family Communication: No family presently at  bedside Disposition Plan: Pending further clinical improvement and weaning of supplemental oxygen. Will get PT to Evaluate as she is not been OOB and not Ambulating   Status is: Inpatient  Remains inpatient appropriate because:Unsafe d/c plan, IV treatments appropriate due to intensity of illness or inability to take PO and Inpatient level of care appropriate due to severity of illness   Dispo: The patient is from: Home              Anticipated d/c is to: Home              Anticipated d/c date is: 2 days              Patient currently is not medically stable to d/c.  Consultants:   Gastroenterology    Procedures:  None  Antimicrobials: Anti-infectives (From admission, onward)   Start     Dose/Rate Route Frequency Ordered Stop   01/01/20 1000  azithromycin (ZITHROMAX) 500 mg in sodium chloride 0.9 % 250 mL IVPB        500 mg 250 mL/hr over 60 Minutes Intravenous Daily 12/31/19 1034 01/05/20 0959   12/31/19 1200  cefTRIAXone (ROCEPHIN) 1 g in sodium chloride 0.9 % 100 mL IVPB        1 g 200 mL/hr over 30 Minutes Intravenous Daily 12/31/19 1034 01/05/20 0959   12/27/19 1000  ciprofloxacin (CIPRO) IVPB 400 mg  Status:  Discontinued        400 mg 200 mL/hr over 60 Minutes Intravenous Every 12 hours 12/26/19 2310 12/31/19 1034   12/27/19 0600  metroNIDAZOLE (FLAGYL) IVPB 500 mg  Status:  Discontinued        500 mg 100 mL/hr over 60 Minutes Intravenous Every 8 hours 12/26/19 2304 12/29/19 0845   12/26/19 2145  ciprofloxacin (CIPRO) IVPB 400 mg        400 mg 200 mL/hr over 60 Minutes Intravenous  Once 12/26/19 2132 12/27/19 0025   12/26/19 2145  metroNIDAZOLE (FLAGYL) IVPB 500 mg        500 mg 100 mL/hr over 60 Minutes Intravenous  Once 12/26/19 2132 12/27/19 0014     Subjective: Seen and examined at bedside and feeling fatigued and had just taken some pain medication.  No nausea or vomiting.  States that she is not able to cough up much.  Still feels weak.  Not having any more diarrhea and no bloody bowel lungs.  Did not have a bowel movement yesterday but had one the day before and felt it was normal.  No other concerns or complaints at this time.  Objective: Vitals:   01/01/20 0414 01/01/20 0841 01/01/20 1004 01/01/20 1106  BP: 139/78     Pulse: 94     Resp:      Temp: 99.8 F (37.7 C) (!) 100.4 F (38 C) (!) 100.6 F (38.1 C) 99.8 F (37.7 C)  TempSrc:   Oral Oral  SpO2: 94%     Weight:      Height:        Intake/Output Summary (Last 24 hours) at 01/01/2020 1159 Last data filed at 01/01/2020 0433 Gross per 24 hour  Intake 100 ml  Output 600 ml  Net -500 ml   Filed Weights    12/26/19 1935 12/27/19 0137  Weight: 90.7 kg 91.7 kg   Examination: Physical Exam:  Constitutional: WN/WD obese AAF in NAD and appears calm sitting in the chair bedside appearing a little somnolent Eyes: Lids and conjunctivae normal, sclerae anicteric  ENMT: External Ears, Nose appear normal. Grossly normal hearing.  Neck: Appears normal, supple, no cervical masses, normal ROM, no appreciable thyromegaly; no JVD Respiratory: Diminished to auscultation bilaterally worse on the Right compared to the Left with mild rhonchi and slight crackles; no wheezing, rales. Normal respiratory effort and patient is not tachypenic. No accessory muscle use. Wearing Supplemental O2 via  Cardiovascular: RRR, no murmurs / rubs / gallops. S1 and S2 auscultated. Mild LE Edema Abdomen: Soft, very slightly-tender, Distended 2/2 to body habitus. Bowel sounds positive x4.  GU: Deferred. Musculoskeletal: No clubbing / cyanosis of digits/nails. No joint deformity upper and lower extremities.   Skin: No rashes, lesions, ulcers on a limited skin evaluation. No induration; Warm and dry.  Neurologic: CN 2-12 grossly intact with no focal deficits. Romberg sign and cerebellar reflexes not assessed.  Psychiatric: Normal judgment and insight. She is slightly somnolent but awake. Normal mood and appropriate affect.   Data Reviewed: I have personally reviewed following labs and imaging studies  CBC: Recent Labs  Lab 12/26/19 2203 12/26/19 2203 12/27/19 0603 12/28/19 0446 12/30/19 0821 12/31/19 0518 01/01/20 0533  WBC 13.8*   < > 13.6*  13.7* 12.5* 11.4* 10.9* 11.9*  NEUTROABS 11.7*  --  10.9*  --  7.9* 7.5 8.5*  HGB 14.4   < > 12.9  13.1 12.4 11.9* 11.8* 12.2  HCT 43.1   < > 38.9  39.0 37.8 35.5* 35.8* 36.4  MCV 91.3   < > 92.0  91.1 92.2 90.3 92.0 90.3  PLT 187   < > 161  157 153 158 172 195   < > = values in this interval not displayed.   Basic Metabolic Panel: Recent Labs  Lab 12/27/19 0603  12/28/19 0446 12/30/19 0821 12/31/19 0518 01/01/20 0533  NA 137 135 137 134* 133*  K 3.1* 3.8 3.7 3.9 3.9  CL 104 101 99 97* 96*  CO2 $Re'24 24 29 28 26  'gEg$ GLUCOSE 130* 124* 113* 107* 109*  BUN 5* <5* <5* <5* 7*  CREATININE 0.52 0.54 0.60 0.48 0.53  CALCIUM 8.2* 8.3* 8.4* 8.3* 8.5*  MG  --   --  1.9 2.0 2.3  PHOS  --   --  2.6 2.8 3.7   GFR: Estimated Creatinine Clearance: 73.7 mL/min (by C-G formula based on SCr of 0.53 mg/dL). Liver Function Tests: Recent Labs  Lab 12/26/19 2203 12/28/19 0446 12/30/19 0821 12/31/19 0518 01/01/20 0533  AST 19 13* $Remo'18 19 19  'qTqOi$ ALT $Rem'18 11 13 14 16  'vFBa$ ALKPHOS 68 48 50 47 48  BILITOT 0.9 0.7 0.9 0.7 0.8  PROT 7.7 5.9* 5.9* 5.7* 6.4*  ALBUMIN 4.3 3.1* 3.1* 3.0* 3.1*   Recent Labs  Lab 12/26/19 0611  LIPASE 30   No results for input(s): AMMONIA in the last 168 hours. Coagulation Profile: No results for input(s): INR, PROTIME in the last 168 hours. Cardiac Enzymes: No results for input(s): CKTOTAL, CKMB, CKMBINDEX, TROPONINI in the last 168 hours. BNP (last 3 results) No results for input(s): PROBNP in the last 8760 hours. HbA1C: Recent Labs    12/31/19 0521  HGBA1C 5.9*   CBG: No results for input(s): GLUCAP in the last 168 hours. Lipid Profile: No results for input(s): CHOL, HDL, LDLCALC, TRIG, CHOLHDL, LDLDIRECT in the last 72 hours. Thyroid Function Tests: No results for input(s): TSH, T4TOTAL, FREET4, T3FREE, THYROIDAB in the last 72 hours. Anemia Panel: Recent Labs    12/31/19 0521  VITAMINB12 331  FOLATE 10.5  FERRITIN  241  TIBC 227*  IRON 27*  RETICCTPCT 1.7   Sepsis Labs: Recent Labs  Lab 12/26/19 2203 12/27/19 0040 12/30/19 0821 12/30/19 1047 12/31/19 0518  PROCALCITON  --   --  <0.10  --  <0.10  LATICACIDVEN 1.0 1.0 0.7 0.8  --     Recent Results (from the past 240 hour(s))  Blood culture (routine x 2)     Status: None   Collection Time: 12/26/19 10:03 PM   Specimen: BLOOD  Result Value Ref Range Status    Specimen Description   Final    BLOOD RIGHT ARM Performed at [facility] [address], [place], [place] [address]    Special Requests   Final    BOTTLES DRAWN AEROBIC AND ANAEROBIC Blood Culture results may not be optimal due to an inadequate volume of blood received in culture bottles Performed at [place] [address], [place], [place] [address]    Culture   Final    NO GROWTH 5 DAYS Performed at [facility], [place] [address], [place], [place] [address]    Report Status 12/31/2019 FINAL  Final  Blood culture (routine x 2)     Status: None   Collection Time: 12/26/19 10:03 PM   Specimen: BLOOD  Result Value Ref Range Status   Specimen Description   Final    BLOOD LEFT ARM Performed at [facility] [address] [place], [place] [address]    Special Requests   Final    BOTTLES DRAWN AEROBIC AND ANAEROBIC Blood Culture results may not be optimal due to an inadequate volume of blood received in culture bottles Performed at [facility] [address] [place], [place] [address]    Culture   Final    NO GROWTH 5 DAYS Performed at [facility], [place] [address] [place], [place] [address]    Report Status 12/31/2019 FINAL  Final  Gastrointestinal Panel by PCR , Stool     Status: Abnormal   Collection Time: 12/26/19 11:33 PM   Specimen: Stool  Result Value Ref Range Status   Campylobacter species NOT DETECTED NOT DETECTED Final   Plesimonas shigelloides NOT DETECTED NOT DETECTED Final   Salmonella species NOT DETECTED NOT DETECTED Final   Yersinia enterocolitica NOT DETECTED NOT DETECTED Final   Vibrio species NOT DETECTED NOT DETECTED Final   Vibrio cholerae NOT DETECTED NOT DETECTED Final   Enteroaggregative E coli (EAEC) NOT DETECTED NOT DETECTED Final   Enteropathogenic E coli (EPEC) DETECTED (A) NOT DETECTED Final    Comment: RESULT CALLED TO, READ BACK BY AND VERIFIED  WITH: JT TAYLOR $RemoveB'@[id number]'[id number]$  12/28/19 MJU    Enterotoxigenic E coli (ETEC) NOT DETECTED NOT DETECTED Final   Shiga like toxin producing E coli (STEC) NOT DETECTED NOT DETECTED Final   Shigella/Enteroinvasive E coli (EIEC) NOT DETECTED NOT DETECTED Final   Cryptosporidium NOT DETECTED NOT DETECTED Final   Cyclospora cayetanensis NOT DETECTED NOT DETECTED Final   Entamoeba histolytica NOT DETECTED NOT DETECTED Final   Giardia lamblia NOT DETECTED NOT DETECTED Final   Adenovirus F40/41 NOT DETECTED NOT DETECTED Final   Astrovirus NOT DETECTED NOT DETECTED Final   Norovirus GI/GII NOT DETECTED NOT DETECTED Final   Rotavirus A NOT DETECTED NOT DETECTED Final   Sapovirus (I, II, IV, and V) NOT DETECTED NOT DETECTED Final    Comment: Performed at [facility], [address], [place], [place] [address]  Respiratory  Panel by RT PCR (Flu A&B, Covid) - Nasopharyngeal Swab     Status: None   Collection Time: 12/27/19 12:20 AM   Specimen: Nasopharyngeal Swab  Result Value Ref Range Status   SARS Coronavirus 2 by RT PCR NEGATIVE NEGATIVE Final    Comment: (NOTE) SARS-CoV-2 target nucleic acids are NOT DETECTED.  The SARS-CoV-2 RNA is generally detectable in upper respiratoy specimens during the acute phase of infection. The lowest concentration of SARS-CoV-2 viral copies this assay can detect is 131 copies/mL. A negative result does not preclude SARS-Cov-2 infection and should not be used as the sole basis for treatment or other patient management decisions. A negative result may occur with  improper specimen collection/handling, submission of specimen other than nasopharyngeal swab, presence of viral mutation(s) within the areas targeted by this assay, and inadequate number of viral copies (<131 copies/mL). A negative result must be combined with clinical observations, patient history, and epidemiological information. The expected result is Negative.  Fact Sheet for Patients:   PinkCheek.be  Fact Sheet for Healthcare Providers:  GravelBags.it  This test is no t yet approved or cleared by the [place] FDA and  has been authorized for detection and/or diagnosis of SARS-CoV-2 by FDA under an Emergency Use Authorization (EUA). This EUA will remain  in effect (meaning this test can be used) for the duration of the COVID-19 declaration under Section 564(b)(1) of the Act, 21 U.S.C. section 360bbb-3(b)(1), unless the authorization is terminated or revoked sooner.     Influenza A by PCR NEGATIVE NEGATIVE Final   Influenza B by PCR NEGATIVE NEGATIVE Final    Comment: (NOTE) The Xpert Xpress SARS-CoV-2/FLU/RSV assay is intended as an aid in  the diagnosis of influenza from Nasopharyngeal swab specimens and  should not be used as a sole basis for treatment. Nasal washings and  aspirates are unacceptable for Xpert Xpress SARS-CoV-2/FLU/RSV  testing.  Fact Sheet for Patients: PinkCheek.be  Fact Sheet for Healthcare Providers: GravelBags.it  This test is not yet approved or cleared by the [place] FDA and  has been authorized for detection and/or diagnosis of SARS-CoV-2 by  FDA under an Emergency Use Authorization (EUA). This EUA will remain  in effect (meaning this test can be used) for the duration of the  Covid-19 declaration under Section 564(b)(1) of the Act, 21  U.S.C. section 360bbb-3(b)(1), unless the authorization is  terminated or revoked. Performed at [facility], [place] [address] [place], [place] [address]   C Difficile Quick Screen w PCR reflex     Status: None   Collection Time: 12/27/19 12:13 PM   Specimen: STOOL  Result Value Ref Range Status   C Diff antigen NEGATIVE NEGATIVE Final   C Diff toxin NEGATIVE NEGATIVE Final   C Diff interpretation No C. difficile detected.  Final    Comment: Performed at  [facility], [place] [address] [place], [place] [address]  Culture, blood (routine x 2)     Status: None (Preliminary result)   Collection Time: 12/30/19 12:47 PM   Specimen: BLOOD LEFT HAND  Result Value Ref Range Status   Specimen Description   Final    BLOOD LEFT HAND Performed at [facility] [address] [place], [place] [address]    Special Requests   Final    BOTTLES DRAWN AEROBIC AND ANAEROBIC Blood Culture adequate volume Performed at [facility] [address], [place], [place] [address]    Culture  Final    NO GROWTH 2 DAYS Performed at [facility], [place] [address] [place], [place] [address]    Report Status PENDING  Incomplete  Culture, blood (routine x 2)     Status: None (Preliminary result)   Collection Time: 12/30/19 12:48 PM   Specimen: BLOOD  Result Value Ref Range Status   Specimen Description   Final    BLOOD RIGHT ANTECUBITAL Performed at [facility] [address], [place], [place] [address]    Special Requests   Final    BOTTLES DRAWN AEROBIC AND ANAEROBIC Blood Culture adequate volume Performed at [facility] [address], [place], [place] [address]    Culture   Final    NO GROWTH 2 DAYS Performed at [place] [facility] [place] [address], [place], [place] [address]    Report Status PENDING  Incomplete   RN Pressure Injury Documentation:     Estimated body mass index is 39.48 kg/m as calculated from the following:   Height as of this encounter: 5' (1.524 m).   Weight as of this encounter: 91.7 kg.  Malnutrition Type:      Malnutrition Characteristics:      Nutrition Interventions:    Radiology Studies: DG CHEST PORT 1 VIEW  Result Date: 01/01/2020 CLINICAL DATA:  Shortness of breath. EXAM: PORTABLE CHEST 1 VIEW COMPARISON:  12/31/2019. FINDINGS: Cardiomegaly. No pulmonary venous congestion. Low lung volumes with bibasilar  atelectasis. Mild right perihilar and right base infiltrates cannot be excluded. No pleural effusion or pneumothorax. IMPRESSION: 1. Cardiomegaly. No pulmonary venous congestion. 2. Low lung volumes with bibasilar atelectasis. Mild right perihilar and right base infiltrates cannot be excluded on today's exam. Electronically Signed   By: Marcello Moores  Register   On: 01/01/2020 06:51   DG CHEST PORT 1 VIEW  Result Date: 12/31/2019 CLINICAL DATA:  Fever. EXAM: PORTABLE CHEST 1 VIEW COMPARISON:  December 30, 2019. FINDINGS: Stable cardiomegaly. No pneumothorax or pleural effusion is noted. Left lung is clear. Mild right basilar subsegmental atelectasis is noted. Bony thorax is unremarkable. IMPRESSION: Mild right basilar subsegmental atelectasis. Electronically Signed   By: Marijo Conception M.D.   On: 12/31/2019 08:26   Scheduled Meds: . guaiFENesin  1,200 mg Oral BID  . ipratropium  0.5 mg Nebulization Q6H  . levalbuterol  0.63 mg Nebulization Q6H   Continuous Infusions: . azithromycin    . cefTRIAXone (ROCEPHIN)  IV 1 g (01/01/20 0941)   Raiford Noble, DO [facility] PAGER is on [place]  If 7PM-7AM, please contact night-coverage [email or web address]

## 2020-01-01 NOTE — Telephone Encounter (Signed)
Patient not discharged yesterday as planned currently on 2L oxygen and per hospitalist note "She is found to have enteropathogenic E. coli and subsequently this morning spiked a temperature and became septic appearing.  She also went in acute hypoxic respiratory failure and a chest x-ray shows possible right base pneumonia with some cardiomegaly which is mild.  Fluid rate has been reduced and blood cultures will be obtained.  Will need to continue monitor carefully as she has a new O2 requirement and because that she met sepsis criteria this morning"  HR team notified patient still in hospital and not to expect her back at work this week.

## 2020-01-01 NOTE — Progress Notes (Signed)
SATURATION QUALIFICATIONS: (This note is used to comply with regulatory documentation for home oxygen)  Patient Saturations on Room Air at Rest = 92%  Patient Saturations on Room Air while Ambulating = 98%  Patient walked approximately 180 ft.

## 2020-01-02 ENCOUNTER — Encounter (HOSPITAL_COMMUNITY): Payer: PRIVATE HEALTH INSURANCE

## 2020-01-02 ENCOUNTER — Inpatient Hospital Stay (HOSPITAL_COMMUNITY): Payer: No Typology Code available for payment source

## 2020-01-02 DIAGNOSIS — I2699 Other pulmonary embolism without acute cor pulmonale: Secondary | ICD-10-CM

## 2020-01-02 DIAGNOSIS — I361 Nonrheumatic tricuspid (valve) insufficiency: Secondary | ICD-10-CM

## 2020-01-02 LAB — COMPREHENSIVE METABOLIC PANEL
ALT: 23 U/L (ref 0–44)
AST: 29 U/L (ref 15–41)
Albumin: 3.3 g/dL — ABNORMAL LOW (ref 3.5–5.0)
Alkaline Phosphatase: 49 U/L (ref 38–126)
Anion gap: 11 (ref 5–15)
BUN: 8 mg/dL (ref 8–23)
CO2: 29 mmol/L (ref 22–32)
Calcium: 8.7 mg/dL — ABNORMAL LOW (ref 8.9–10.3)
Chloride: 93 mmol/L — ABNORMAL LOW (ref 98–111)
Creatinine, Ser: 0.67 mg/dL (ref 0.44–1.00)
GFR, Estimated: >60 mL/min (ref >60)
Glucose, Bld: 116 mg/dL — ABNORMAL HIGH (ref 70–99)
Potassium: 3.9 mmol/L (ref 3.5–5.1)
Sodium: 133 mmol/L — ABNORMAL LOW (ref 135–145)
Total Bilirubin: 0.8 mg/dL (ref 0.3–1.2)
Total Protein: 6.8 g/dL (ref 6.5–8.1)

## 2020-01-02 LAB — APTT: aPTT: 38 seconds — ABNORMAL HIGH (ref 24–36)

## 2020-01-02 LAB — CBC WITH DIFFERENTIAL/PLATELET
Abs Immature Granulocytes: 0.09 10*3/uL — ABNORMAL HIGH (ref 0.00–0.07)
Basophils Absolute: 0 10*3/uL (ref 0.0–0.1)
Basophils Relative: 0 %
Eosinophils Absolute: 0.1 10*3/uL (ref 0.0–0.5)
Eosinophils Relative: 1 %
HCT: 35.7 % — ABNORMAL LOW (ref 36.0–46.0)
Hemoglobin: 11.9 g/dL — ABNORMAL LOW (ref 12.0–15.0)
Immature Granulocytes: 1 %
Lymphocytes Relative: 15 %
Lymphs Abs: 1.5 10*3/uL (ref 0.7–4.0)
MCH: 30.4 pg (ref 26.0–34.0)
MCHC: 33.3 g/dL (ref 30.0–36.0)
MCV: 91.1 fL (ref 80.0–100.0)
Monocytes Absolute: 1.4 10*3/uL — ABNORMAL HIGH (ref 0.1–1.0)
Monocytes Relative: 14 %
Neutro Abs: 6.8 10*3/uL (ref 1.7–7.7)
Neutrophils Relative %: 69 %
Platelets: 223 10*3/uL (ref 150–400)
RBC: 3.92 MIL/uL (ref 3.87–5.11)
RDW: 14 % (ref 11.5–15.5)
WBC: 10 10*3/uL (ref 4.0–10.5)
nRBC: 0 % (ref 0.0–0.2)

## 2020-01-02 LAB — ECHOCARDIOGRAM COMPLETE
Area-P 1/2: 5.38 cm2
Height: 60 in
S' Lateral: 2.2 cm
Weight: 3234.59 oz

## 2020-01-02 LAB — MAGNESIUM: Magnesium: 2.3 mg/dL (ref 1.7–2.4)

## 2020-01-02 LAB — HEPARIN LEVEL (UNFRACTIONATED)
Heparin Unfractionated: 0.3 IU/mL (ref 0.30–0.70)
Heparin Unfractionated: 0.2 IU/mL — ABNORMAL LOW (ref 0.30–0.70)

## 2020-01-02 LAB — PROTIME-INR
INR: 1.3 — ABNORMAL HIGH (ref 0.8–1.2)
Prothrombin Time: 15.2 seconds (ref 11.4–15.2)

## 2020-01-02 LAB — PHOSPHORUS: Phosphorus: 3.1 mg/dL (ref 2.5–4.6)

## 2020-01-02 NOTE — Progress Notes (Signed)
[place] for heparin Indication: pulmonary embolus  No Known Allergies  Patient Measurements: Height: 5' (152.4 cm) Weight: 91.7 kg (202 lb 2.6 oz) IBW/kg (Calculated) : 45.5 Heparin Dosing Weight: 67kg  Vital Signs: Temp: 98.4 F (36.9 C) (11/06 0644) Temp Source: Oral (11/06 0644) BP: 143/84 (11/06 0644) Pulse Rate: 85 (11/06 0644)  Labs: Recent Labs    12/31/19 0518 12/31/19 0518 01/01/20 0533 01/01/20 2353 01/02/20 0703  HGB 11.8*   < > 12.2  --  11.9*  HCT 35.8*  --  36.4  --  35.7*  PLT 172  --  195  --  223  APTT  --   --   --  38*  --   LABPROT  --   --   --  15.2  --   INR  --   --   --  1.3*  --   HEPARINUNFRC  --   --   --   --  0.30  CREATININE 0.48  --  0.53  --  0.67   < > = values in this interval not displayed.    Estimated Creatinine Clearance: 73.7 mL/min (by C-G formula based on SCr of 0.67 mg/dL).   Medical History: Past Medical History:  Diagnosis Date  . Arthritis   . Hay fever   . High blood pressure   . Hyperlipidemia   . Hypothyroidism   . Obesity   . Thyroid disease      Assessment: 64 y.o.femalewith medical history significant ofHTN, HLD.  Pt seen for abd pain and treated for E.Coli.  Pt also diagnosed with PNA.  Pharmacy consulted to dose heparin drip for PE.  No prior AC noted.  01/02/2020  Hgb low, stable, PLT wnl Scr WNL HL at goal though at the low end of therapeutic range No bleeding or infusion issues per RN   Goal of Therapy:  Heparin level 0.3-0.7 units/ml Monitor platelets by anticoagulation protocol   Plan:  Increase heparin rate to 1200 units/hr Heparin level in 6 hours Daily [place], PharmD, [place] [phone] 01/02/2020, 8:23 AM

## 2020-01-02 NOTE — Progress Notes (Signed)
  Echocardiogram 2D Echocardiogram has been performed.  Darlina Sicilian M 01/02/2020, 9:06 AM

## 2020-01-02 NOTE — Progress Notes (Signed)
[facility] for heparin Indication: pulmonary embolus  No Known Allergies  Patient Measurements: Height: 5' (152.4 cm) Weight: 91.7 kg (202 lb 2.6 oz) IBW/kg (Calculated) : 45.5 Heparin Dosing Weight: 67kg  Vital Signs: Temp: 99.9 F (37.7 C) (11/06 1320) Temp Source: Oral (11/06 1320) BP: 144/87 (11/06 1320) Pulse Rate: 82 (11/06 1320)  Labs: Recent Labs    12/31/19 0518 12/31/19 0518 01/01/20 0533 01/01/20 2353 01/02/20 0703 01/02/20 1747  HGB 11.8*   < > 12.2  --  11.9*  --   HCT 35.8*  --  36.4  --  35.7*  --   PLT 172  --  195  --  223  --   APTT  --   --   --  38*  --   --   LABPROT  --   --   --  15.2  --   --   INR  --   --   --  1.3*  --   --   HEPARINUNFRC  --   --   --   --  0.30 0.20*  CREATININE 0.48  --  0.53  --  0.67  --    < > = values in this interval not displayed.    Estimated Creatinine Clearance: 73.7 mL/min (by C-G formula based on SCr of 0.67 mg/dL).   Medical History: Past Medical History:  Diagnosis Date  . Arthritis   . Hay fever   . High blood pressure   . Hyperlipidemia   . Hypothyroidism   . Obesity   . Thyroid disease      Assessment: 64 y.o.femalewith medical history significant ofHTN, HLD.  Pt seen for abd pain and treated for E.Coli.  Pt also diagnosed with PNA.  Pharmacy consulted to dose heparin drip for PE.  No prior AC noted.  01/02/2020  Hgb low, stable, PLT wnl Scr WNL HL decreased despite rate increase Per RN, "no time in between old bag ending and new bag starting or line issues" though new bag was hung in very close proximity to level collection No bleeding or infusion issues noted   Goal of Therapy:  Heparin level 0.3-0.7 units/ml Monitor platelets by anticoagulation protocol   Plan:  Increase heparin rate to 1300 units/hr Heparin level in 6 hours Daily [place], PharmD, [place] [phone] 01/02/2020, 7:50 PM

## 2020-01-02 NOTE — Evaluation (Signed)
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 Patient limited to set up seated ADLs and min guard for ambulation. Patient will benefit from skilled OT services while in hospital to improve deficits and learn compensatory strategies as needed in order to return to PLOF.   Expect patient will progress to modified independent and won't need [place] services but will determine need for [facility] services closer to discharge.     Follow Up Recommendations  No OT follow up;Home health OT    Equipment Recommendations  Tub/shower seat    Recommendations for Other Services       Precautions / Restrictions Precautions Precautions: None      Mobility Bed Mobility Overal bed mobility: Needs Assistance Bed Mobility: Sit to Supine       Sit to supine: Supervision   General bed mobility comments: pt in recliner    Transfers Overall transfer level: Needs assistance Equipment used: None Transfers: Sit to/from Stand Sit to Stand: Min guard         General transfer comment: min guard to ambulate in room. slow gait. use of hands on furniture to steady herself.    Balance Overall balance assessment: Mild deficits observed, not formally tested                                         ADL either performed or assessed with clinical judgement   ADL Overall ADL's : Needs assistance/impaired Eating/Feeding: Independent   Grooming: Independent   Upper Body Bathing: Sitting;Set up   Lower Body Bathing: Set up;Minimal assistance   Upper Body Dressing : Set up;Sitting   Lower Body Dressing: Minimal assistance;Sit to/from  stand;Set up   Toilet Transfer: Min guard;Ambulation;Regular Toilet   Toileting- Water quality scientist and Hygiene: Supervision/safety;Sit to/from stand       Functional mobility during ADLs: Min guard       Vision Patient Visual Report: No change from baseline       Perception     Praxis      Pertinent Vitals/Pain Pain Assessment: Faces Faces Pain Scale: Hurts little more Pain Location: abdomen, shoulder (muscle spasms) Pain Descriptors / Indicators: Grimacing;Guarding Pain Intervention(s): Limited activity within patient's tolerance;Monitored during session;Repositioned     Hand Dominance     Extremity/Trunk Assessment Upper Extremity Assessment Upper Extremity Assessment: Generalized weakness   Lower Extremity Assessment Lower Extremity Assessment: Defer to PT evaluation   Cervical / Trunk Assessment Cervical / Trunk Assessment: Normal   Communication Communication Communication: No difficulties   Cognition Arousal/Alertness: Awake/alert Behavior During Therapy: WFL for tasks assessed/performed Overall Cognitive Status: Within Functional Limits for tasks assessed                                     General Comments       Exercises     Shoulder Instructions      Home Living Family/patient expects to be discharged to:: Private residence   Available Help at Discharge: Family;Friend(s);Available PRN/intermittently  Home Layout: Able to live on main level with bedroom/bathroom     Bathroom Shower/Tub: Tub/shower unit;Curtain   Bathroom Toilet: Standard     Home Equipment: None          Prior Functioning/Environment Level of Independence: Independent        Comments: works at a [employer].        OT Problem List: Decreased activity tolerance;Decreased knowledge of use of DME or AE;Obesity;Pain      OT Treatment/Interventions: Self-care/ADL training;Therapeutic exercise;DME and/or AE  instruction;Patient/family education;Therapeutic activities    OT Goals(Current goals can be found in the care plan section) Acute Rehab OT Goals Patient Stated Goal: to go home without equipment OT Goal Formulation: With patient Time For Goal Achievement: 01/16/20 Potential to Achieve Goals: Good  OT Frequency: Min 2X/week   Barriers to D/C:            Co-evaluation              AM-PAC OT "6 Clicks" Daily Activity     Outcome Measure Help from another person eating meals?: None Help from another person taking care of personal grooming?: A Little Help from another person toileting, which includes using toliet, bedpan, or urinal?: A Little Help from another person bathing (including washing, rinsing, drying)?: A Little Help from another person to put on and taking off regular upper body clothing?: A Little Help from another person to put on and taking off regular lower body clothing?: A Little 6 Click Score: 19   End of Session Nurse Communication: Mobility status  Activity Tolerance: Patient limited by pain Patient left: in bed;with call bell/phone within reach;with bed alarm set  OT Visit Diagnosis: Muscle weakness (generalized) (M62.81)                Time: 3343-5686 OT Time Calculation (min): 17 min Charges:  OT General Charges $OT Visit: 1 Visit OT Evaluation $OT Eval Low Complexity: 1 Low  Timotheus Salm, OTR/L [place]  Office [phone] Pager: [phone]   Lenward Chancellor 01/02/2020, 3:55 PM

## 2020-01-02 NOTE — Evaluation (Signed)
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 Pt currently with functional limitations due to the deficits listed below (see PT Problem List). Pt will benefit from skilled PT to increase their independence and safety with mobility to allow discharge to the venue listed below.   Pt ambulated in hallway with RW.  Pt's SpO2 90% on room air during ambulation. Pt hopeful for d/c soon and return to work.  Pt plans to not require O2 or RW upon d/c, so encouraged pt to continue ambulating at least 3x/day.     Follow Up Recommendations No PT follow up    Equipment Recommendations  None recommended by PT (pt declines RW)    Recommendations for Other Services       Precautions / Restrictions Precautions Precautions: None Restrictions Weight Bearing Restrictions: No      Mobility  Bed Mobility               General bed mobility comments: pt in recliner    Transfers Overall transfer level: Needs assistance Equipment used: Rolling walker (2 wheeled) Transfers: Sit to/from Stand Sit to Stand: Min guard         General transfer comment: slow transfers, use of UEs to self assist  Ambulation/Gait Ambulation/Gait assistance: Min guard Gait Distance (Feet): 400 Feet Assistive device: Rolling walker (2 wheeled) Gait Pattern/deviations: Step-through pattern;Decreased stride length     General Gait Details: slow but steady gait with RW, SpO2 90% on room air and 93% upon return to Physicist, medical    Modified Rankin (Stroke Patients Only)       Balance Overall balance assessment: No apparent balance deficits (not formally assessed)                                           Pertinent Vitals/Pain Pain Assessment:  Faces Faces Pain Scale: Hurts a little bit Pain Location: abdomen Pain Descriptors / Indicators: Aching;Cramping Pain Intervention(s): Repositioned;Monitored during session    Home Living Family/patient expects to be discharged to:: Private residence             Home Layout: Able to live on main level with bedroom/bathroom Home Equipment: None      Prior Function Level of Independence: Independent               Hand Dominance        Extremity/Trunk Assessment        Lower Extremity Assessment Lower Extremity Assessment: Overall WFL for tasks assessed    Cervical / Trunk Assessment Cervical / Trunk Assessment: Normal  Communication   Communication: No difficulties  Cognition Arousal/Alertness: Awake/alert Behavior During Therapy: WFL for tasks assessed/performed Overall Cognitive Status: Within Functional Limits for tasks assessed                                        General Comments      Exercises     Assessment/Plan    PT Assessment Patient needs continued PT services  PT Problem List Decreased strength;Decreased knowledge of use of  DME;Decreased activity tolerance;Decreased mobility       PT Treatment Interventions Therapeutic exercise;Gait training;DME instruction;Balance training;Functional mobility training;Therapeutic activities;Patient/family education    PT Goals (Current goals can be found in the Care Plan section)  Acute Rehab PT Goals PT Goal Formulation: With patient Time For Goal Achievement: 01/16/20 Potential to Achieve Goals: Good    Frequency Min 3X/week   Barriers to discharge        Co-evaluation               AM-PAC PT "6 Clicks" Mobility  Outcome Measure Help needed turning from your back to your side while in a flat bed without using bedrails?: A Little Help needed moving from lying on your back to sitting on the side of a flat bed without using bedrails?: A Little Help needed moving to  and from a bed to a chair (including a wheelchair)?: A Little Help needed standing up from a chair using your arms (e.g., wheelchair or bedside chair)?: A Little Help needed to walk in hospital room?: A Little Help needed climbing 3-5 steps with a railing? : A Little 6 Click Score: 18    End of Session   Activity Tolerance: Patient tolerated treatment well Patient left: in chair;with call bell/phone within reach   PT Visit Diagnosis: Difficulty in walking, not elsewhere classified (R26.2)    Time: 8786-7672 PT Time Calculation (min) (ACUTE ONLY): 20 min   Charges:   PT Evaluation $PT Eval Low Complexity: 1 Low         Kati PT, DPT Acute Rehabilitation Services Pager: [phone] Office: [phone]  York Ram E 01/02/2020, 12:51 PM

## 2020-01-02 NOTE — Plan of Care (Signed)
  Problem: Clinical Measurements: Goal: Ability to maintain clinical measurements within normal limits will improve Outcome: Progressing Goal: Will remain free from infection Outcome: Progressing Goal: Diagnostic test results will improve Outcome: Progressing   Problem: Activity: Goal: Risk for activity intolerance will decrease Outcome: Progressing   Problem: Nutrition: Goal: Adequate nutrition will be maintained Outcome: Progressing   Problem: Elimination: Goal: Will not experience complications related to bowel motility Outcome: Progressing   Problem: Pain Managment: Goal: General experience of comfort will improve Outcome: Progressing

## 2020-01-02 NOTE — Progress Notes (Signed)
PROGRESS NOTE    Jamie Kelley  JJK:[id number] DOB: March 15, 1955 DOA: 12/26/2019 PCP: Midge Minium, MD  Brief Narrative:  HPI per Dr. Jennette Kettle on 12/26/19 Jamie Kelley is a 64 y.o. female with medical history significant of HTN, HLD.  Pt seen earlier today for abd pain.  Onset 2 days ago.  Diffuse abd cramping, diarrhea.  Diarrhea is bloody.  No h/o diverticulitis nor colitis.  No fever, N/V.  Discharged home with cipro/flagyl.  Pain has persisted and pt returns to ED.  ED Course: CT earlier today shows colitis.  HGB stable at 14.  Given cipro/flagyl and hospitalist asked to admit.  **Interim History Over the last few weeks the patient was trying to lose weight and change her diet eating habits and was eating a lot of fresh salads with strep on top and smoothies.  And at times she felt on occasion does have appear to be altered.  She is found to have enteropathogenic E. coli and subsequently this morning spiked a temperature and became septic appearing.  She also went in acute hypoxic respiratory failure and a chest x-ray shows possible right base pneumonia with some cardiomegaly which is mild.  Fluid rate has been reduced and blood cultures will be obtained.  Will need to continue monitor carefully as she has a new O2 requirement and because that she met sepsis criteria this morning  12/31/19: Continues to be febrile but her abdominal pain is improving.  We will transition her off of the ciprofloxacin to IV ceftriaxone and azithromycin given that she likely has pneumonia.  Will need to continue monitor her temperature curve and WBC.  Will need to attempt to wean her oxygen requirement.  Her procalcitonin level is less than 0.10  01/01/20: Still remains febrile and WBC slightly elevated. Will continue IV Abx and OOB and Ambulate.   01/02/20: Last night she started spike another temperature so CT of the chest was done and showed an acute PE. Will further work-up for PE and obtain  echocardiogram and lower extremity venous duplex. She has not been started on a heparin drip will transition to oral anticoagulants in the next day or so  Assessment & Plan:   Principal Problem:   Colitis presumed infectious Active Problems:   HTN (hypertension)   BRBPR (bright red blood per rectum)   Colitis  Acute colitis due to enteropathogenic E. Coli -Changed IV ciprofloxacin x 10 days total to IV ceftriaxone and IV azithromycin and this was done 12/31/19; she is already received 5 days of ciprofloxacin and she no longer needs metronidazole.   -Overall pain is improving but still with significant cramping at times.   -Will Continue trial oral pain regimen today and if tolerates without need for further IV pain meds  -C. difficile was negative -Still has some abdominal cramping but it is improving  -continue to monitor closely and continue with supportive care and continue with Bentyl 10 mg p.o. every 6 as needed for spasms, acetaminophen, 650 mg p.o./RC every 6 as needed for mild pain or fever, Oxycodone 5 mg p.o. every 4h  For breakthrough pain, Morphine 2 to 4 mg IV every 4 as needed for severe pain, antiemetics with ondansetron 4 mg p.o./IV every 6 as needed for nausea as well as -Continued with full liquid diet and advance slowly to SOFT and this was done yesterday; Tolerating diet   Hypokalemia -due to diarrhea, repleted -Potassium is now 3.9 again  -Continue to monitor and replete as necessary -Check  magnesium Level daily  -Repeat CMP in a.m.  Severe Sepsis from Enteropathogenic E. coli with possible concomitant Right Basilar pneumonia -Was febrile the 12/29/19 with a TMax of 101.2, Tachycardic, Has a Leukocytosis (13.7 and trending down to 10.9 but slightly bumped to 11.9) and a Source of Infection with Enteropathogenic E Coli and went into Acute Respiratory Failure with Hypoxia; she continues to remain febrile and had a temperature of 102.7 yesterday  -IVF now stopped   -Lactic acid level on admission was 1.0 and repeat this morning was 0.7 and further repeat was 0.8 -Procalcitonin level was less than 0.10 x2 -Patient's WBC is trending downward from 13.7 -> 12.5 -> 11.4 -> 10.9 -> 11.9 -> 10.0 -ESR was 20 -Check blood cultures and initially they were negative and repeat showing no growth to date at 2 Days -Repeat CXR this AM showed "Normal heart size. Decreased lung volumes with mild asymmetric elevation of right hemidiaphragm. Bibasilar atelectasis again noted. No airspace consolidation. No pleural effusion or edema identified."  Acute PE -CTA of the Chest Showed "Partially occlusive posterior right lower lobe subsegmental pulmonary embolism. No evidence of right ventricular heart strain. Trace right pleural effusion with adjacent basilar atelectasis. Unchanged bilateral thyroid nodules." -Started on Heparin gtt and will continue for Now -Obtain ECHOCardiogram and LE Venous Duplex for Completion -ECHO showed EF of 60-65% with Normal LV Fxn and showed that the Right Ventricular size was normal and had a normal RV Systolic Fxn -LE Venous duplex pending -Was back on O2 and will wean off -Will contact TOC for cost of Eliquis vs. Xarelto   Essential Hypertension -Blood pressures this AM was stable at 144/87 -We will continue to monitor blood pressures per protocol -If necessary will add as needed hydralazine  Acute Respiratory Failure with Hypoxia in the setting of above and possible pneumonia and now likely from an acute PE -In the setting of As above -12/30/2019 check DG Chest X-Ray and showed "Patchy mainly linear airspace opacities at the RIGHT lung base, may represent atelectasis or early infection. Mild cardiomegaly and central pulmonary vascular engorgement." -Repeat chest x-ray this morning showed "Cardiomegaly. No pulmonary venous congestion. Low lung volumes with bibasilar atelectasis. Mild right perihilar and right base infiltrates cannot be excluded  on today's exam." -Noted to be hypoxic on the a.m. of 11/3 and his O2 saturation's on 86% on room air -Flutter Valve and Incentive Spirometry -SpO2: 96 % O2 Flow Rate (L/min): 2 L/min  -Added guaifenesin 1200 mg p.o. twice daily, as well as Ipratropium and Levalbuterol nebs every 6 hours -Check BNP and was 51.4 and if necessary will obtain an echocardiogram; patient was positive 2.645 and is now Positive 1.760 L; Echocardiogram as below and she was given a dose of IV Lasix -CTA of the Chest showed " -Have Added Flutter Valve and Incentive Spirometry  -Continuous Pulse Oximetry and maintain O2 saturations greater than 90% -Continue supplemental oxygen via nasal cannula and wean O2 as tolerated -We will need an ambulatory home O2 screen prior to discharge -Repeat chest x-ray in a.m. and if still not improving may consider CT scan of the chest  Hyperglcyemia in the setting of New onset Pre-Diabetes -HbA1c was 5.9 -Patient's blood sugar has been ranging from 113-130 daily on BMP/CMP; Blood Sugar this AM was 116 -Continue monitor blood sugars carefully and if necessary will place on sensitive NovoLog/scale insulin AC  Normocytic Anemia -Patient's hemoglobin/hematocrit went from 13.1/39.0 -> 12.4/37.8 -> 11.9/35.5 -> 11.8/35.8 -> 12.2/36.4 -> 11.9/35.7 -Checked Anemia  panel and showed an iron level of 27, U IBC of 200, TIBC of 227, saturation ratio 12%, ferritin level 241, folate level of 10.5, vitamin B12 of 331 -likely this is dilutional drop in the setting of IV fluid resuscitation  -Continue monitor for signs and symptoms of bleeding now that she is getting IV Heparin; currently no overt bleeding noted -Repeat CBC in a.m.  Hyponatremia -Mild -Patient's sodium dropped from 137 is now 133 -If not improving will consider resuming IV fluid hydration -Continue monitor and trend and repeat CMP in a.m.  Obesity -Complicates overall Prognosis and Care -Estimated body mass index is 39.48 kg/m  as calculated from the following:   Height as of this encounter: 5' (1.524 m).   Weight as of this encounter: 91.7 kg. -Weight Loss and Dietary Counseling given  DVT prophylaxis: SCDs; Heparin gtt Code Status: FULL CODE Family Communication: No family presently at bedside Disposition Plan: Pending further clinical improvement and weaning of supplemental oxygen. Will get PT to Evaluate as she is not been OOB and not Ambulating   Status is: Inpatient  Remains inpatient appropriate because:Unsafe d/c plan, IV treatments appropriate due to intensity of illness or inability to take PO and Inpatient level of care appropriate due to severity of illness   Dispo: The patient is from: Home              Anticipated d/c is to: Home              Anticipated d/c date is: 2 days              Patient currently is not medically stable to d/c.  Consultants:   Gastroenterology    Procedures: None  Antimicrobials: Anti-infectives (From admission, onward)   Start     Dose/Rate Route Frequency Ordered Stop   01/01/20 1000  azithromycin (ZITHROMAX) 500 mg in sodium chloride 0.9 % 250 mL IVPB        500 mg 250 mL/hr over 60 Minutes Intravenous Daily 12/31/19 1034 01/05/20 0959   12/31/19 1200  cefTRIAXone (ROCEPHIN) 1 g in sodium chloride 0.9 % 100 mL IVPB        1 g 200 mL/hr over 30 Minutes Intravenous Daily 12/31/19 1034 01/05/20 0959   12/27/19 1000  ciprofloxacin (CIPRO) IVPB 400 mg  Status:  Discontinued        400 mg 200 mL/hr over 60 Minutes Intravenous Every 12 hours 12/26/19 2310 12/31/19 1034   12/27/19 0600  metroNIDAZOLE (FLAGYL) IVPB 500 mg  Status:  Discontinued        500 mg 100 mL/hr over 60 Minutes Intravenous Every 8 hours 12/26/19 2304 12/29/19 0845   12/26/19 2145  ciprofloxacin (CIPRO) IVPB 400 mg        400 mg 200 mL/hr over 60 Minutes Intravenous  Once 12/26/19 2132 12/27/19 0025   12/26/19 2145  metroNIDAZOLE (FLAGYL) IVPB 500 mg        500 mg 100 mL/hr over 60 Minutes  Intravenous  Once 12/26/19 2132 12/27/19 0014     Subjective: Seen and examined at bedside and feeling weak still.  She denies any abdominal pain currently.  No nausea or vomiting.  Just does not feel back to baseline.  Still wearing supplemental oxygen via nasal cannula.  Denies any chest pain.  Has not had a bowel movement in the last 2 days.  No other concerns or plans at this time.  Objective: Vitals:   01/02/20 0225 01/02/20 7322 01/02/20 0254  01/02/20 1320  BP: (!) 143/87 (!) 143/84 (!) 141/92 (!) 144/87  Pulse: 93 85 92 82  Resp: _0 Temp: (!) 100.4 F (38 C) 98.4 F (36.9 C) 98.9 F (37.2 C) 99.9 F (37.7 C)  TempSrc: Oral Oral Oral Oral  SpO2: 97% 98% 96% 96%  Weight:      Height:        Intake/Output Summary (Last 24 hours) at 01/02/2020 1420 Last data filed at 01/02/2020 1330 Gross per 24 hour  Intake 564.73 ml  Output 1600 ml  Net -1035.27 ml   Filed Weights   12/26/19 1935 12/27/19 0137  Weight: 90.7 kg 91.7 kg   Examination: Physical Exam:  Constitutional: WN/WD obese AAF in NAD and appears calm and but appears fatigued Eyes: Lids and conjunctivae normal, sclerae anicteric  ENMT: External Ears, Nose appear normal. Grossly normal hearing.  Neck: Appears normal, supple, no cervical masses, normal ROM, no appreciable thyromegaly; no JVD Respiratory: Diminished to auscultation bilaterally more so on the Right compared to the Left , no wheezing, rales, rhonchi or crackles. Normal respiratory effort and patient is not tachypenic. No accessory muscle use. Wearing Supplemental O2 via [place] again Cardiovascular: RRR, no murmurs / rubs / gallops. S1 and S2 auscultated. No extremity edema. 2+ pedal pulses. No carotid bruits.  Abdomen: Soft, non-tender, non-distended. No masses palpated. No appreciable hepatosplenomegaly. Bowel sounds positive.  GU: Deferred. Musculoskeletal: No clubbing / cyanosis of digits/nails. No joint deformity upper and lower extremities.   Skin: No rashes, lesions, ulcers on a limited skin evaluation. No induration; Warm and dry.  Neurologic: CN 2-12 grossly intact with no focal deficits. Romberg sign and cerebellar reflexes not assessed.  Psychiatric: Normal judgment and insight. Alert and oriented x 3. Normal mood and appropriate affect.   Data Reviewed: I have personally reviewed following labs and imaging studies  CBC: Recent Labs  Lab 12/27/19 0603 12/27/19 0603 12/28/19 0446 12/30/19 0821 12/31/19 0518 01/01/20 0533 01/02/20 0703  WBC 13.6*  13.7*   < > 12.5* 11.4* 10.9* 11.9* 10.0  NEUTROABS 10.9*  --   --  7.9* 7.5 8.5* 6.8  HGB 12.9  13.1   < > 12.4 11.9* 11.8* 12.2 11.9*  HCT 38.9  39.0   < > 37.8 35.5* 35.8* 36.4 35.7*  MCV 92.0  91.1   < > 92.2 90.3 92.0 90.3 91.1  PLT 161  157   < > 153 158 172 195 223   < > = values in this interval not displayed.   Basic Metabolic Panel: Recent Labs  Lab 12/28/19 0446 12/30/19 0821 12/31/19 0518 01/01/20 0533 01/02/20 0703  NA 135 137 134* 133* 133*  K 3.8 3.7 3.9 3.9 3.9  CL 101 99 97* 96* 93*  CO2 _1 GLUCOSE 124* 113* 107* 109* 116*  BUN <5* <5* <5* 7* 8  CREATININE 0.54 0.60 0.48 0.53 0.67  CALCIUM 8.3* 8.4* 8.3* 8.5* 8.7*  MG  --  1.9 2.0 2.3 2.3  PHOS  --  2.6 2.8 3.7 3.1   GFR: Estimated Creatinine Clearance: 73.7 mL/min (by C-G formula based on SCr of 0.67 mg/dL). Liver Function Tests: Recent Labs  Lab 12/28/19 0446 12/30/19 0821 12/31/19 0518 01/01/20 0533 01/02/20 0703  AST 13* _2 ALT _3 ALKPHOS 48 50 47 48 49  BILITOT 0.7 0.9 0.7 0.8 0.8  PROT 5.9* 5.9* 5.7* 6.4* 6.8  ALBUMIN 3.1*  3.1* 3.0* 3.1* 3.3*   No results for input(s): LIPASE, AMYLASE in the last 168 hours. No results for input(s): AMMONIA in the last 168 hours. Coagulation Profile: Recent Labs  Lab 01/01/20 2353  INR 1.3*   Cardiac Enzymes: No results for input(s): CKTOTAL, CKMB, CKMBINDEX, TROPONINI in the last 168  hours. BNP (last 3 results) No results for input(s): PROBNP in the last 8760 hours. HbA1C: Recent Labs    12/31/19 0521  HGBA1C 5.9*   CBG: No results for input(s): GLUCAP in the last 168 hours. Lipid Profile: No results for input(s): CHOL, HDL, LDLCALC, TRIG, CHOLHDL, LDLDIRECT in the last 72 hours. Thyroid Function Tests: No results for input(s): TSH, T4TOTAL, FREET4, T3FREE, THYROIDAB in the last 72 hours. Anemia Panel: Recent Labs    12/31/19 0521  VITAMINB12 331  FOLATE 10.5  FERRITIN 241  TIBC 227*  IRON 27*  RETICCTPCT 1.7   Sepsis Labs: Recent Labs  Lab 12/26/19 2203 12/27/19 0040 12/30/19 0821 12/30/19 1047 12/31/19 0518  PROCALCITON  --   --  <0.10  --  <0.10  LATICACIDVEN 1.0 1.0 0.7 0.8  --     Recent Results (from the past 240 hour(s))  Blood culture (routine x 2)     Status: None   Collection Time: 12/26/19 10:03 PM   Specimen: BLOOD  Result Value Ref Range Status   Specimen Description   Final    BLOOD RIGHT ARM Performed at [facility] [address], [place], [place] [address]    Special Requests   Final    BOTTLES DRAWN AEROBIC AND ANAEROBIC Blood Culture results may not be optimal due to an inadequate volume of blood received in culture bottles Performed at [facility] [address], [place], [place] [address]    Culture   Final    NO GROWTH 5 DAYS Performed at [facility], [place] [address] [place], [place] [address]    Report Status 12/31/2019 FINAL  Final  Blood culture (routine x 2)     Status: None   Collection Time: 12/26/19 10:03 PM   Specimen: BLOOD  Result Value Ref Range Status   Specimen Description   Final    BLOOD LEFT ARM Performed at [facility] [address], [place], [place] [address]    Special Requests   Final    BOTTLES DRAWN AEROBIC AND ANAEROBIC Blood Culture results may not be optimal due to an inadequate volume of blood received in culture  bottles Performed at [facility] [address], [place], [place] [address]    Culture   Final    NO GROWTH 5 DAYS Performed at [facility], [place] [address], [place], [place] [address]    Report Status 12/31/2019 FINAL  Final  Gastrointestinal Panel by PCR , Stool     Status: Abnormal   Collection Time: 12/26/19 11:33 PM   Specimen: Stool  Result Value Ref Range Status   Campylobacter species NOT DETECTED NOT DETECTED Final   Plesimonas shigelloides NOT DETECTED NOT DETECTED Final   Salmonella species NOT DETECTED NOT DETECTED Final   Yersinia enterocolitica NOT DETECTED NOT DETECTED Final   Vibrio species NOT DETECTED NOT DETECTED Final   Vibrio cholerae NOT DETECTED NOT DETECTED Final   Enteroaggregative E coli (EAEC) NOT DETECTED NOT DETECTED Final   Enteropathogenic E coli (EPEC) DETECTED (A) NOT DETECTED Final    Comment: RESULT CALLED TO, READ BACK BY AND VERIFIED  WITH: Clerance Lav _0  12/28/19 MJU    Enterotoxigenic E coli (ETEC) NOT DETECTED NOT DETECTED Final   Shiga like toxin producing E coli (STEC) NOT DETECTED NOT DETECTED Final   Shigella/Enteroinvasive E coli (EIEC) NOT DETECTED NOT DETECTED Final   Cryptosporidium NOT DETECTED NOT DETECTED Final   Cyclospora cayetanensis NOT DETECTED NOT DETECTED Final   Entamoeba histolytica NOT DETECTED NOT DETECTED Final   Giardia lamblia NOT DETECTED NOT DETECTED Final   Adenovirus F40/41 NOT DETECTED NOT DETECTED Final   Astrovirus NOT DETECTED NOT DETECTED Final   Norovirus GI/GII NOT DETECTED NOT DETECTED Final   Rotavirus A NOT DETECTED NOT DETECTED Final   Sapovirus (I, II, IV, and V) NOT DETECTED NOT DETECTED Final    Comment: Performed at [facility], [address], [place], [place] [address]  Respiratory Panel by RT PCR (Flu A&B, Covid) - Nasopharyngeal Swab     Status: None   Collection Time: 12/27/19 12:20 AM   Specimen: Nasopharyngeal Swab  Result Value Ref Range Status    SARS Coronavirus 2 by RT PCR NEGATIVE NEGATIVE Final    Comment: (NOTE) SARS-CoV-2 target nucleic acids are NOT DETECTED.  The SARS-CoV-2 RNA is generally detectable in upper respiratoy specimens during the acute phase of infection. The lowest concentration of SARS-CoV-2 viral copies this assay can detect is 131 copies/mL. A negative result does not preclude SARS-Cov-2 infection and should not be used as the sole basis for treatment or other patient management decisions. A negative result may occur with  improper specimen collection/handling, submission of specimen other than nasopharyngeal swab, presence of viral mutation(s) within the areas targeted by this assay, and inadequate number of viral copies (<131 copies/mL). A negative result must be combined with clinical observations, patient history, and epidemiological information. The expected result is Negative.  Fact Sheet for Patients:  PinkCheek.be  Fact Sheet for Healthcare Providers:  GravelBags.it  This test is no t yet approved or cleared by the [place] FDA and  has been authorized for detection and/or diagnosis of SARS-CoV-2 by FDA under an Emergency Use Authorization (EUA). This EUA will remain  in effect (meaning this test can be used) for the duration of the COVID-19 declaration under Section 564(b)(1) of the Act, 21 U.S.C. section 360bbb-3(b)(1), unless the authorization is terminated or revoked sooner.     Influenza A by PCR NEGATIVE NEGATIVE Final   Influenza B by PCR NEGATIVE NEGATIVE Final    Comment: (NOTE) The Xpert Xpress SARS-CoV-2/FLU/RSV assay is intended as an aid in  the diagnosis of influenza from Nasopharyngeal swab specimens and  should not be used as a sole basis for treatment. Nasal washings and  aspirates are unacceptable for Xpert Xpress SARS-CoV-2/FLU/RSV  testing.  Fact Sheet for  Patients: PinkCheek.be  Fact Sheet for Healthcare Providers: GravelBags.it  This test is not yet approved or cleared by the [place] FDA and  has been authorized for detection and/or diagnosis of SARS-CoV-2 by  FDA under an Emergency Use Authorization (EUA). This EUA will remain  in effect (meaning this test can be used) for the duration of the  Covid-19 declaration under Section 564(b)(1) of the Act, 21  U.S.C. section 360bbb-3(b)(1), unless the authorization is  terminated or revoked. Performed at [facility], [place] [address] [place], [place] [address]   C Difficile Quick Screen w PCR reflex     Status: None   Collection Time: 12/27/19 12:13 PM   Specimen: STOOL  Result Value Ref  Range Status   C Diff antigen NEGATIVE NEGATIVE Final   C Diff toxin NEGATIVE NEGATIVE Final   C Diff interpretation No C. difficile detected.  Final    Comment: Performed at [facility], [place] [address], [place], [place] [address]  Culture, blood (routine x 2)     Status: None (Preliminary result)   Collection Time: 12/30/19 12:47 PM   Specimen: BLOOD LEFT HAND  Result Value Ref Range Status   Specimen Description   Final    BLOOD LEFT HAND Performed at [facility] [address] [place], [place] [address]    Special Requests   Final    BOTTLES DRAWN AEROBIC AND ANAEROBIC Blood Culture adequate volume Performed at [facility] [address] [place], [place] [address]    Culture   Final    NO GROWTH 3 DAYS Performed at [facility], [place] [address], [place], [place] [address]    Report Status PENDING  Incomplete  Culture, blood (routine x 2)     Status: None (Preliminary result)   Collection Time: 12/30/19 12:48 PM   Specimen: BLOOD  Result Value Ref Range Status   Specimen Description   Final    BLOOD RIGHT ANTECUBITAL Performed at [facility] [address], [place], [place] [address]    Special Requests   Final    BOTTLES DRAWN AEROBIC AND ANAEROBIC Blood Culture adequate volume Performed at [facility] [address], [place], [place] [address]    Culture   Final    NO GROWTH 3 DAYS Performed at [facility], [place] [address] [place], [place] [address]    Report Status PENDING  Incomplete  Culture, blood (routine x 2)     Status: None (Preliminary result)   Collection Time: 01/01/20  7:17 PM   Specimen: BLOOD  Result Value Ref Range Status   Specimen Description   Final    BLOOD LEFT ANTECUBITAL Performed at [facility] [address] [place], [place] [address]    Special Requests   Final    BOTTLES DRAWN AEROBIC AND ANAEROBIC Blood Culture adequate volume Performed at [facility] [address], [place], [place] [address]    Culture   Final    NO GROWTH < 12 HOURS Performed at [facility] [address], [place], [place] [address]    Report Status PENDING  Incomplete  Culture, blood (routine x 2)     Status: None (Preliminary result)   Collection Time: 01/01/20  7:18 PM   Specimen: BLOOD LEFT HAND  Result Value Ref Range Status   Specimen Description   Final    BLOOD LEFT HAND Performed at [facility] [address], [place], [place] [address]    Special Requests   Final    BOTTLES DRAWN AEROBIC AND ANAEROBIC Blood Culture adequate volume Performed at [facility] [address] [place], [place] [address]    Culture   Final    NO GROWTH < 12 HOURS Performed at [facility] [address], [place],  [address]    Report Status PENDING  Incomplete   RN Pressure Injury Documentation:     Estimated body mass index is 39.48 kg/m as calculated from the following:   Height as of this encounter: 5' (1.524 m).   Weight as of this encounter: 91.7  kg.  Malnutrition Type:  Malnutrition Characteristics:      Nutrition Interventions:    Radiology Studies: CT ANGIO CHEST PE W OR WO CONTRAST  Result Date: 01/01/2020 CLINICAL DATA:  Chest pain and shortness of breath EXAM: CT ANGIOGRAPHY CHEST WITH CONTRAST TECHNIQUE: Multidetector CT imaging of the chest was performed using the standard protocol during bolus administration of intravenous contrast. Multiplanar CT image reconstructions and MIPs were obtained to evaluate the vascular anatomy. CONTRAST:  160m OMNIPAQUE IOHEXOL 350 MG/ML SOLN COMPARISON:  None. FINDINGS: Cardiovascular: There is a optimal opacification of the pulmonary arteries. There is no central or segmental filling defect. There appears to be a partially occlusive filling defect in the posterior right lower lobe subsegmental arterial branch. There is mild cardiomegaly. No pericardial effusion or thickening. No evidence right heart strain. There is normal three-vessel brachiocephalic anatomy without proximal stenosis. The thoracic aorta is normal in appearance. Mediastinum/Nodes: No hilar, mediastinal, or axillary adenopathy. 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 This was visualized on recent ultrasound of December 18, 2019 and is unchanged. Lungs/Pleura: There is a trace right pleural effusion with adjacent patchy airspace opacities at both lung bases. Upper Abdomen: No acute abnormalities present in the visualized portions of the upper abdomen. Musculoskeletal: No chest wall abnormality. No acute or significant osseous findings. Review of the MIP images confirms the above findings. IMPRESSION: 1. Partially occlusive posterior right lower lobe subsegmental pulmonary embolism. No evidence of right ventricular heart strain. 2. Trace right pleural effusion with adjacent basilar atelectasis. 3. Unchanged bilateral thyroid nodules. These results will be  called to the ordering clinician or representative by the Radiologist Assistant, and communication documented in the PACS or [employer] Electronically Signed   By: BPrudencio PairM.D.   On: 01/01/2020 22:41   DG CHEST PORT 1 VIEW  Result Date: 01/02/2020 CLINICAL DATA:  Shortness of breath.  Pulmonary embolus. EXAM: PORTABLE CHEST 1 VIEW COMPARISON:  01/01/2020 FINDINGS: Normal heart size. Decreased lung volumes with mild asymmetric elevation of right hemidiaphragm. Bibasilar atelectasis again noted. No airspace consolidation. No pleural effusion or edema identified. IMPRESSION: Decreased lung volumes with bibasilar atelectasis Electronically Signed   By: TKerby MoorsM.D.   On: 01/02/2020 06:31   DG CHEST PORT 1 VIEW  Result Date: 01/01/2020 CLINICAL DATA:  Shortness of breath. EXAM: PORTABLE CHEST 1 VIEW COMPARISON:  12/31/2019. FINDINGS: Cardiomegaly. No pulmonary venous congestion. Low lung volumes with bibasilar atelectasis. Mild right perihilar and right base infiltrates cannot be excluded. No pleural effusion or pneumothorax. IMPRESSION: 1. Cardiomegaly. No pulmonary venous congestion. 2. Low lung volumes with bibasilar atelectasis. Mild right perihilar and right base infiltrates cannot be excluded on today's exam. Electronically Signed   By: TMarcello Moores Register   On: 01/01/2020 06:51   ECHOCARDIOGRAM COMPLETE  Result Date: 01/02/2020    ECHOCARDIOGRAM REPORT   Patient Name:   Jamie Kelley Date of Exam: 01/02/2020 Medical Rec #:  [id number] Height:       60.0 in Accession #:    [id number]:       202.2 lb Date of Birth:  [birth year] BSA:          1.875 m Patient Age:    68years   BP:           143/84 mmHg Patient Gender: F          HR:           84 bpm. Exam Location:  Inpatient  Procedure: 2D Echo and Strain Analysis Indications:    Pulmonary Embolus 415.19 / I26.99  History:        Patient has no prior history of Echocardiogram examinations.                 Risk Factors:Hypertension and  Dyslipidemia. Acute colitis due to                 enteropathogenic E. Coli with severe sepsis. Acute Respiratory                 Failure with Hypoxia                 possible pneumonia. Normocytic Anemia.  Sonographer:    Darlina Sicilian RDCS Referring Phys: [id number] Georgina Quint LATIF [place]  1. Left ventricular ejection fraction, by estimation, is 65 to 70%. The left ventricle has normal function. The left ventricle has no regional wall motion abnormalities. There is mild left ventricular hypertrophy. Left ventricular diastolic parameters are indeterminate. Global longitudinal strain calculation does not appear accurate (endocardial borders not tracked adequately) and is not reported.  2. Right ventricular systolic function is normal. The right ventricular size is normal. There is normal pulmonary artery systolic pressure. The estimated right ventricular systolic pressure is 60.7 mmHg.  3. The mitral valve is grossly normal. Trivial mitral valve regurgitation.  4. The aortic valve is tricuspid. Aortic valve regurgitation is not visualized.  5. The inferior vena cava is normal in size with greater than 50% respiratory variability, suggesting right atrial pressure of 3 mmHg. FINDINGS  Left Ventricle: Left ventricular ejection fraction, by estimation, is 65 to 70%. The left ventricle has normal function. The left ventricle has no regional wall motion abnormalities. The left ventricular internal cavity size was normal in size. There is  mild left ventricular hypertrophy. Left ventricular diastolic parameters are indeterminate. Right Ventricle: The right ventricular size is normal. No increase in right ventricular wall thickness. Right ventricular systolic function is normal. There is normal pulmonary artery systolic pressure. The tricuspid regurgitant velocity is 2.57 m/s, and  with an assumed right atrial pressure of 3 mmHg, the estimated right ventricular systolic pressure is 37.1 mmHg. Left Atrium: Left atrial  size was normal in size. Right Atrium: Right atrial size was normal in size. Pericardium: There is no evidence of pericardial effusion. Mitral Valve: The mitral valve is grossly normal. Trivial mitral valve regurgitation. Tricuspid Valve: The tricuspid valve is grossly normal. Tricuspid valve regurgitation is mild. Aortic Valve: The aortic valve is tricuspid. Aortic valve regurgitation is not visualized. Pulmonic Valve: The pulmonic valve was grossly normal. Pulmonic valve regurgitation is trivial. Aorta: The aortic root is normal in size and structure. Venous: The inferior vena cava is normal in size with greater than 50% respiratory variability, suggesting right atrial pressure of 3 mmHg. IAS/Shunts: No atrial level shunt detected by color flow Doppler.  LEFT VENTRICLE PLAX 2D LVIDd:         3.50 cm  Diastology LVIDs:         2.20 cm  LV e' medial:    5.66 cm/s LV PW:         1.00 cm  LV E/e' medial:  9.6 LV IVS:        1.20 cm  LV e' lateral:   5.33 cm/s LVOT diam:     1.80 cm  LV E/e' lateral: 10.2 LV SV:         46 LV SV Index:  24 LVOT Area:     2.54 cm  LEFT ATRIUM             Index       RIGHT ATRIUM          Index LA diam:        3.10 cm 1.65 cm/m  RA Area:     9.50 cm LA Vol (A2C):   25.1 ml 13.39 ml/m RA Volume:   18.50 ml 9.87 ml/m LA Vol (A4C):   31.0 ml 16.53 ml/m LA Biplane Vol: 28.1 ml 14.99 ml/m  AORTIC VALVE LVOT Vmax:   120.00 cm/s LVOT Vmean:  83.900 cm/s LVOT VTI:    0.179 m  AORTA Ao Root diam: 3.10 cm Ao Asc diam:  3.00 cm MITRAL VALVE               TRICUSPID VALVE MV Area (PHT): 5.38 cm    TR Peak grad:   26.4 mmHg MV Decel Time: 141 msec    TR Vmax:        257.00 cm/s MV E velocity: 54.50 cm/s MV A velocity: 77.80 cm/s  SHUNTS MV E/A ratio:  0.70        Systemic VTI:  0.18 m                            Systemic Diam: 1.80 cm Rozann Lesches MD Electronically signed by Rozann Lesches MD Signature Date/Time: 01/02/2020/1:01:47 PM    Final    Scheduled Meds: . guaiFENesin  1,200 mg  Oral BID   Continuous Infusions: . azithromycin 500 mg (01/02/20 1310)  . cefTRIAXone (ROCEPHIN)  IV 1 g (01/02/20 1132)  . heparin 1,200 Units/hr (01/02/20 0908)   Raiford Noble, DO [facility] PAGER is on [place]  If 7PM-7AM, please contact night-coverage [email or web address]

## 2020-01-03 ENCOUNTER — Inpatient Hospital Stay (HOSPITAL_COMMUNITY): Payer: No Typology Code available for payment source

## 2020-01-03 DIAGNOSIS — I2699 Other pulmonary embolism without acute cor pulmonale: Secondary | ICD-10-CM

## 2020-01-03 LAB — HEPARIN LEVEL (UNFRACTIONATED)
Heparin Unfractionated: 0.38 IU/mL (ref 0.30–0.70)
Heparin Unfractionated: 0.34 IU/mL (ref 0.30–0.70)

## 2020-01-03 LAB — CBC WITH DIFFERENTIAL/PLATELET
Abs Immature Granulocytes: 0.07 10*3/uL (ref 0.00–0.07)
Basophils Absolute: 0.1 10*3/uL (ref 0.0–0.1)
Basophils Relative: 1 %
Eosinophils Absolute: 0.2 10*3/uL (ref 0.0–0.5)
Eosinophils Relative: 2 %
HCT: 34.8 % — ABNORMAL LOW (ref 36.0–46.0)
Hemoglobin: 11.6 g/dL — ABNORMAL LOW (ref 12.0–15.0)
Immature Granulocytes: 1 %
Lymphocytes Relative: 20 %
Lymphs Abs: 1.9 10*3/uL (ref 0.7–4.0)
MCH: 30.5 pg (ref 26.0–34.0)
MCHC: 33.3 g/dL (ref 30.0–36.0)
MCV: 91.6 fL (ref 80.0–100.0)
Monocytes Absolute: 1.2 10*3/uL — ABNORMAL HIGH (ref 0.1–1.0)
Monocytes Relative: 13 %
Neutro Abs: 5.8 10*3/uL (ref 1.7–7.7)
Neutrophils Relative %: 63 %
Platelets: 247 10*3/uL (ref 150–400)
RBC: 3.8 MIL/uL — ABNORMAL LOW (ref 3.87–5.11)
RDW: 14 % (ref 11.5–15.5)
WBC: 9.2 10*3/uL (ref 4.0–10.5)
nRBC: 0 % (ref 0.0–0.2)

## 2020-01-03 LAB — COMPREHENSIVE METABOLIC PANEL
ALT: 33 U/L (ref 0–44)
AST: 41 U/L (ref 15–41)
Albumin: 3.3 g/dL — ABNORMAL LOW (ref 3.5–5.0)
Alkaline Phosphatase: 57 U/L (ref 38–126)
Anion gap: 11 (ref 5–15)
BUN: 10 mg/dL (ref 8–23)
CO2: 27 mmol/L (ref 22–32)
Calcium: 8.5 mg/dL — ABNORMAL LOW (ref 8.9–10.3)
Chloride: 96 mmol/L — ABNORMAL LOW (ref 98–111)
Creatinine, Ser: 0.55 mg/dL (ref 0.44–1.00)
GFR, Estimated: >60 mL/min (ref >60)
Glucose, Bld: 114 mg/dL — ABNORMAL HIGH (ref 70–99)
Potassium: 4.3 mmol/L (ref 3.5–5.1)
Sodium: 134 mmol/L — ABNORMAL LOW (ref 135–145)
Total Bilirubin: 0.7 mg/dL (ref 0.3–1.2)
Total Protein: 6.7 g/dL (ref 6.5–8.1)

## 2020-01-03 LAB — MAGNESIUM: Magnesium: 2.4 mg/dL (ref 1.7–2.4)

## 2020-01-03 LAB — PHOSPHORUS: Phosphorus: 2.7 mg/dL (ref 2.5–4.6)

## 2020-01-03 NOTE — Progress Notes (Signed)
[place] for heparin Indication: pulmonary embolus  No Known Allergies  Patient Measurements: Height: 5' (152.4 cm) Weight: 91.7 kg (202 lb 2.6 oz) IBW/kg (Calculated) : 45.5 Heparin Dosing Weight: 67kg  Vital Signs: Temp: 99.2 F (37.3 C) (11/07 0511) Temp Source: Oral (11/07 0511) BP: 140/80 (11/07 0511) Pulse Rate: 79 (11/07 0511)  Labs: Recent Labs    01/01/20 0533 01/01/20 0533 01/01/20 2353 01/02/20 0703 01/02/20 0703 01/02/20 1747 01/03/20 0109 01/03/20 0759  HGB 12.2   < >  --  11.9*  --   --  11.6*  --   HCT 36.4  --   --  35.7*  --   --  34.8*  --   PLT 195  --   --  223  --   --  247  --   APTT  --   --  38*  --   --   --   --   --   LABPROT  --   --  15.2  --   --   --   --   --   INR  --   --  1.3*  --   --   --   --   --   HEPARINUNFRC  --   --   --  0.30   < > 0.20* 0.38 0.34  CREATININE 0.53  --   --  0.67  --   --  0.55  --    < > = values in this interval not displayed.    Estimated Creatinine Clearance: 73.7 mL/min (by C-G formula based on SCr of 0.55 mg/dL).   Medical History: Past Medical History:  Diagnosis Date  . Arthritis   . Hay fever   . High blood pressure   . Hyperlipidemia   . Hypothyroidism   . Obesity   . Thyroid disease      Assessment: 64 y.o.femalewith medical history significant ofHTN, HLD.  Pt seen for abd pain and treated for E.Coli.  Pt also diagnosed with PNA.  Pharmacy consulted to dose heparin drip for PE.  No prior AC noted.  01/03/2020  Hgb low but stable, PLT wnl Scr WNL HL 0..34 now therapeutic after increase No bleeding or infusion issues noted   Goal of Therapy:  Heparin level 0.3-0.7 units/ml Monitor platelets by anticoagulation protocol   Plan:  continue heparin rate at 1300 units/hr Daily CBC/HL  Ulice Dash, PharmD, [place] [phone] 01/03/2020, 12:11 PM

## 2020-01-03 NOTE — Plan of Care (Signed)
  Problem: Clinical Measurements: Goal: Ability to maintain clinical measurements within normal limits will improve 01/03/2020 2219 by Normand Sloop, RN Outcome: Progressing 01/03/2020 2219 by Normand Sloop, RN Outcome: Progressing Goal: Will remain free from infection 01/03/2020 2219 by Normand Sloop, RN Outcome: Progressing 01/03/2020 2219 by Normand Sloop, RN Outcome: Progressing Goal: Diagnostic test results will improve 01/03/2020 2219 by Normand Sloop, RN Outcome: Progressing 01/03/2020 2219 by Normand Sloop, RN Outcome: Progressing   Problem: Activity: Goal: Risk for activity intolerance will decrease 01/03/2020 2219 by Normand Sloop, RN Outcome: Progressing 01/03/2020 2219 by Normand Sloop, RN Outcome: Progressing   Problem: Nutrition: Goal: Adequate nutrition will be maintained 01/03/2020 2219 by Normand Sloop, RN Outcome: Progressing 01/03/2020 2219 by Normand Sloop, RN Outcome: Progressing   Problem: Elimination: Goal: Will not experience complications related to bowel motility 01/03/2020 2219 by Normand Sloop, RN Outcome: Progressing 01/03/2020 2219 by Normand Sloop, RN Outcome: Progressing   Problem: Pain Managment: Goal: General experience of comfort will improve 01/03/2020 2219 by Normand Sloop, RN Outcome: Progressing 01/03/2020 2219 by Normand Sloop, RN Outcome: Progressing

## 2020-01-03 NOTE — Telephone Encounter (Signed)
Noted that pt was not d/c as planned over the weekend. After drop in O2 levels while ambulating, and continued fever spikes, CT chest was performed and found an acute PE. Pt started on heparin and will be transitioning to oral anticoag soon. No revised d/c date found.

## 2020-01-03 NOTE — Plan of Care (Signed)
  Problem: Clinical Measurements: Goal: Ability to maintain clinical measurements within normal limits will improve Outcome: Progressing Goal: Will remain free from infection Outcome: Progressing Goal: Diagnostic test results will improve Outcome: Progressing   Problem: Activity: Goal: Risk for activity intolerance will decrease Outcome: Progressing   Problem: Nutrition: Goal: Adequate nutrition will be maintained Outcome: Progressing   Problem: Elimination: Goal: Will not experience complications related to bowel motility Outcome: Progressing   Problem: Pain Managment: Goal: General experience of comfort will improve Outcome: Progressing

## 2020-01-03 NOTE — Progress Notes (Signed)
ANTICOAGULATION CONSULT NOTE   Pharmacy Consult for heparin Indication: pulmonary embolus  No Known Allergies  Patient Measurements: Height: 5' (152.4 cm) Weight: 91.7 kg (202 lb 2.6 oz) IBW/kg (Calculated) : 45.5 Heparin Dosing Weight: 67kg  Vital Signs: Temp: 99.9 F (37.7 C) (11/06 2010) Temp Source: Oral (11/06 2010) BP: 134/77 (11/06 2010) Pulse Rate: 86 (11/06 2010)  Labs: Recent Labs    01/01/20 0533 01/01/20 0533 01/01/20 2353 01/02/20 0703 01/02/20 1747 01/03/20 0109  HGB 12.2   < >  --  11.9*  --  11.6*  HCT 36.4  --   --  35.7*  --  34.8*  PLT 195  --   --  223  --  247  APTT  --   --  38*  --   --   --   LABPROT  --   --  15.2  --   --   --   INR  --   --  1.3*  --   --   --   HEPARINUNFRC  --   --   --  0.30 0.20* 0.38  CREATININE 0.53  --   --  0.67  --  0.55   < > = values in this interval not displayed.    Estimated Creatinine Clearance: 73.7 mL/min (by C-G formula based on SCr of 0.55 mg/dL).   Medical History: Past Medical History:  Diagnosis Date  . Arthritis   . Hay fever   . High blood pressure   . Hyperlipidemia   . Hypothyroidism   . Obesity   . Thyroid disease      Assessment: 64 y.o.femalewith medical history significant ofHTN, HLD.  Pt seen for abd pain and treated for E.Coli.  Pt also diagnosed with PNA.  Pharmacy consulted to dose heparin drip for PE.  No prior AC noted.  01/03/2020  Hgb low, stable, PLT wnl Scr WNL HL 0.38, therapeutic No bleeding or infusion issues noted   Goal of Therapy:  Heparin level 0.3-0.7 units/ml Monitor platelets by anticoagulation protocol   Plan:  continue heparin rate at 1300 units/hr Heparin level in 6 hours Daily CBC  Dolly Rias RPh 01/03/2020, 2:12 AM

## 2020-01-03 NOTE — Progress Notes (Signed)
Bilateral lower extremity venous duplex completed. Refer to "CV Proc" under chart review to view preliminary results.  01/03/2020 2:04 PM Kelby Aline., MHA, RVT, RDCS, RDMS

## 2020-01-03 NOTE — Progress Notes (Signed)
PROGRESS NOTE    Jamie Kelley  UKC:[id number] DOB: [birth year] DOA: 12/26/2019 PCP: Sheliah Hatch, MD  Brief Narrative:  HPI per Dr. Lyda Perone on 12/26/19 Jamie Kelley is a 64 y.o. female with medical history significant of HTN, HLD.  Pt seen earlier today for abd pain.  Onset 2 days ago.  Diffuse abd cramping, diarrhea.  Diarrhea is bloody.  No h/o diverticulitis nor colitis.  No fever, N/V.  Discharged home with cipro/flagyl.  Pain has persisted and pt returns to ED.  ED Course: CT earlier today shows colitis.  HGB stable at 14.  Given cipro/flagyl and hospitalist asked to admit.  **Interim History Over the last few weeks the patient was trying to lose weight and change her diet eating habits and was eating a lot of fresh salads with strep on top and smoothies.  And at times she felt on occasion does have appear to be altered.  She is found to have enteropathogenic E. coli and subsequently this morning spiked a temperature and became septic appearing.  She also went in acute hypoxic respiratory failure and a chest x-ray shows possible right base pneumonia with some cardiomegaly which is mild.  Fluid rate has been reduced and blood cultures will be obtained.  Will need to continue monitor carefully as she has a new O2 requirement and because that she met sepsis criteria this morning  12/31/19: Continues to be febrile but her abdominal pain is improving.  We will transition her off of the ciprofloxacin to IV ceftriaxone and azithromycin given that she likely has pneumonia.  Will need to continue monitor her temperature curve and WBC.  Will need to attempt to wean her oxygen requirement.  Her procalcitonin level is less than 0.10  01/01/20: Still remains febrile and WBC slightly elevated. Will continue IV Abx and OOB and Ambulate.   01/02/20: Last night she started spike another temperature so CT of the chest was done and showed an acute PE. Will further work-up for PE and obtain  echocardiogram and lower extremity venous duplex. She has not been started on a heparin drip will transition to oral anticoagulants in the next day or so  01/03/20: ECHO Showed a normal EF but indeterminate Diastolic Parameters. Patient was feeling better and weaned off of O2; Today is day 2 of Heparin gtt and will change to po Anticoagulation in the AM and will await the LE venous Duplex.    Assessment & Plan:   Principal Problem:   Colitis presumed infectious Active Problems:   HTN (hypertension)   BRBPR (bright red blood per rectum)   Colitis  Acute colitis due to enteropathogenic E. Coli -Changed IV ciprofloxacin x 10 days total to IV ceftriaxone and IV azithromycin and this was done 12/31/19; she is already received 5 days of ciprofloxacin and she no longer needs metronidazole.   -Overall pain is improving but still with significant cramping at times.   -Will Continue trial oral pain regimen today and if tolerates without need for further IV pain meds  -C. difficile was negative -Still has some abdominal cramping but it is improving  -continue to monitor closely and continue with supportive care and continue with Bentyl 10 mg p.o. every 6 as needed for spasms, acetaminophen, 650 mg p.o./RC every 6 as needed for mild pain or fever, Oxycodone 5 mg p.o. every 4h  For breakthrough pain, Morphine 2 to 4 mg IV every 4 as needed for severe pain, antiemetics with ondansetron 4 mg p.o./IV every 6  as needed for nausea as well as -Continued with full liquid diet and advance slowly to SOFT and this was done yesterday; Tolerating diet well -No recurrent Abdominal Pain   Hypokalemia -due to diarrhea, repleted and now diarrhea resolved  -Potassium is now 4.3 -Continue to monitor and replete as necessary -Check magnesium Level daily  -Repeat CMP in a.m.  Severe Sepsis from Enteropathogenic E. coli with possible concomitant Right Basilar pneumonia, improving  -Was febrile the 12/29/19 with a TMax of  101.2, Tachycardic, Has a Leukocytosis (13.7 and trending down to 10.9 but slightly bumped to 11.9) and a Source of Infection with Enteropathogenic E Coli and went into Acute Respiratory Failure with Hypoxia; she continues to remain febrile and had a temperature of 102.7 yesterday  -IVF now stopped  -Lactic acid level on admission was 1.0 and repeat this morning was 0.7 and further repeat was 0.8 -Procalcitonin level was less than 0.10 x2 -Patient's WBC is trending downward from 13.7 -> 12.5 -> 11.4 -> 10.9 -> 11.9 -> 10.0 -> 9.2 -ESR was 20 -Check blood cultures and initially they were negative and repeat showing no growth to date at 2 Days still -Repeat CXR yesterday AM showed "Normal heart size. Decreased lung volumes with mild asymmetric elevation of right hemidiaphragm. Bibasilar atelectasis again noted. No airspace consolidation. No pleural effusion or edema identified."  Acute PE -CTA of the Chest Showed "Partially occlusive posterior right lower lobe subsegmental pulmonary embolism. No evidence of right ventricular heart strain. Trace right pleural effusion with adjacent basilar atelectasis. Unchanged bilateral thyroid nodules." -Started on Heparin gtt and will continue for Now -Obtain ECHOCardiogram and LE Venous Duplex for Completion -ECHO showed EF of 60-65% with Normal LV Fxn and showed that the Right Ventricular size was normal and had a normal RV Systolic Fxn; Diastolic Parameters were indeterminate  -LE Venous duplex pending still  -Was back on O2 and will wean off -Will contact TOC for cost of Eliquis vs. Xarelto   Essential Hypertension -Blood pressures this AM was stable at 128/82 -We will continue to monitor blood pressures per protocol -If necessary will add as needed hydralazine  Acute Respiratory Failure with Hypoxia in the setting of above and possible pneumonia and now likely from an acute PE, improving  -In the setting of As above -12/30/2019 check DG Chest X-Ray and  showed "Patchy mainly linear airspace opacities at the RIGHT lung base, may represent atelectasis or early infection. Mild cardiomegaly and central pulmonary vascular engorgement." -Repeat CXR 01/02/20 as above -Noted to be hypoxic on the a.m. of 11/3 and his O2 saturation's on 86% on room air -Flutter Valve and Incentive Spirometry -SpO2: 94 % O2 Flow Rate (L/min): 2 L/min; Now off of Supplemental O2 via [place] -Added guaifenesin 1200 mg p.o. twice daily, as well as Ipratropium and Levalbuterol nebs every 6 hours -Check BNP and was 51. -Echocardiogram as below and she was given a dose of IV Lasix -Patient is +1.059 L since admission -CTA of the Chest showed "Partially occlusive posterior right lower lobe subsegmental pulmonary embolism. No evidence of right ventricular heart strain. Trace right pleural effusion with adjacent basilar atelectasis. Unchanged bilateral thyroid nodules." -Have Added Flutter Valve and Incentive Spirometry  -Continuous Pulse Oximetry and maintain O2 saturations greater than 90% -Continue supplemental oxygen via nasal cannula and wean O2 as tolerated -We will need an ambulatory home O2 screen prior to discharge; She slightly dropped to 88%; Will re-evaluate Ambulatory Home O2 screen in the AM   Hyperglcyemia  in the setting of New onset Pre-Diabetes -HbA1c was 5.9 -Patient's blood sugar has been ranging from 113-130 daily on BMP/CMP; Blood Sugar this AM was 114 -Continue monitor blood sugars carefully and if necessary will place on sensitive NovoLog/scale insulin AC  Normocytic Anemia -Patient's hemoglobin/hematocrit went from 13.1/39.0 -> 12.4/37.8 -> 11.9/35.5 -> 11.8/35.8 -> 12.2/36.4 -> 11.9/35.7 -> 11.6/34.8 -Checked Anemia panel and showed an iron level of 27, U IBC of 200, TIBC of 227, saturation ratio 12%, ferritin level 241, folate level of 10.5, vitamin B12 of 331 -likely this is dilutional drop in the setting of IV fluid resuscitation  -Continue monitor for  signs and symptoms of bleeding now that she is getting IV Heparin; currently no overt bleeding noted -Repeat CBC in a.m.  Hyponatremia -Mild -Patient's sodium dropped from 137 is now 133 yesterday and 134 now  -If not improving will consider resuming IV fluid hydration -Continue monitor and trend and repeat CMP in a.m.  Obesity -Complicates overall Prognosis and Care -Estimated body mass index is 39.48 kg/m as calculated from the following:   Height as of this encounter: 5' (1.524 m).   Weight as of this encounter: 91.7 kg. -Weight Loss and Dietary Counseling given  DVT prophylaxis: SCDs; Heparin gtt and will change to po Anticoagulation in the AM  Code Status: FULL CODE Family Communication: No family presently at bedside Disposition Plan: Pending further clinical improvement and weaning of supplemental oxygen. Will get PT to Evaluate as she is not been OOB and not Ambulating; Anticipating D/C home in the AM   Status is: Inpatient  Remains inpatient appropriate because:Unsafe d/c plan, IV treatments appropriate due to intensity of illness or inability to take PO and Inpatient level of care appropriate due to severity of illness   Dispo: The patient is from: Home              Anticipated d/c is to: Home              Anticipated d/c date is: 1 day              Patient currently is not medically stable to d/c.  Consultants:   Gastroenterology    Procedures: None  Antimicrobials: Anti-infectives (From admission, onward)   Start     Dose/Rate Route Frequency Ordered Stop   01/01/20 1000  azithromycin (ZITHROMAX) 500 mg in sodium chloride 0.9 % 250 mL IVPB        500 mg 250 mL/hr over 60 Minutes Intravenous Daily 12/31/19 1034 01/05/20 0959   12/31/19 1200  cefTRIAXone (ROCEPHIN) 1 g in sodium chloride 0.9 % 100 mL IVPB        1 g 200 mL/hr over 30 Minutes Intravenous Daily 12/31/19 1034 01/05/20 0959   12/27/19 1000  ciprofloxacin (CIPRO) IVPB 400 mg  Status:  Discontinued         400 mg 200 mL/hr over 60 Minutes Intravenous Every 12 hours 12/26/19 2310 12/31/19 1034   12/27/19 0600  metroNIDAZOLE (FLAGYL) IVPB 500 mg  Status:  Discontinued        500 mg 100 mL/hr over 60 Minutes Intravenous Every 8 hours 12/26/19 2304 12/29/19 0845   12/26/19 2145  ciprofloxacin (CIPRO) IVPB 400 mg        400 mg 200 mL/hr over 60 Minutes Intravenous  Once 12/26/19 2132 12/27/19 0025   12/26/19 2145  metroNIDAZOLE (FLAGYL) IVPB 500 mg        500 mg 100 mL/hr over 60 Minutes Intravenous  Once 12/26/19 2132 12/27/19 0014     Subjective: Seen and examined at bedside and says she feels much better than when she came in but still is a little weak.  No nausea or vomiting.  Has not had a bowel movement in a few days now.  Denies any chest pain.  No lightheadedness or dizziness.  No other concerns or complaints at this time but did slightly desaturate on ambulation today and dropped down to 88% while talking and ambulating. Will transition to po Anticoagulation in the AM.   Objective: Vitals:   01/02/20 1320 01/02/20 2010 01/03/20 0511 01/03/20 1253  BP: (!) 144/87 134/77 140/80 128/82  Pulse: 82 86 79 84  Resp: $Remo'18 18 18 16  'FJZBB$ Temp: 99.9 F (37.7 C) 99.9 F (37.7 C) 99.2 F (37.3 C) 99.8 F (37.7 C)  TempSrc: Oral Oral Oral Oral  SpO2: 96% 97% 99% 94%  Weight:      Height:        Intake/Output Summary (Last 24 hours) at 01/03/2020 1307 Last data filed at 01/03/2020 1117 Gross per 24 hour  Intake 618.89 ml  Output 1200 ml  Net -581.11 ml   Filed Weights   12/26/19 1935 12/27/19 0137  Weight: 90.7 kg 91.7 kg   Examination: Physical Exam:  Constitutional: WN/WD obese African-American female currently in no acute distress sitting in the chair appears calm but slightly fatigued again Eyes: Lids and conjunctivae normal, sclerae anicteric  ENMT: External Ears, Nose appear normal. Grossly normal hearing.  Neck: Appears normal, supple, no cervical masses, normal ROM, no  appreciable thyromegaly; no JVD Respiratory: Diminished to auscultation bilaterally bilaterally more so on the right compared to left., no wheezing, rales, rhonchi or crackles. Normal respiratory effort and patient is not tachypenic. No accessory muscle use.  No longer wearing supplemental oxygen via nasal cannula Cardiovascular: RRR, no murmurs / rubs / gallops. S1 and S2 auscultated.  Has minimal lower extremity edema Abdomen: Soft, non-tender, distended secondary to body habitus. Bowel sounds positive.  GU: Deferred. Musculoskeletal: No clubbing / cyanosis of digits/nails. No joint deformity upper and lower extremities.  Skin: No rashes, lesions, ulcers on limited skin evaluation. No induration; Warm and dry.  Neurologic: CN 2-12 grossly intact with no focal deficits. Romberg sign and cerebellar reflexes not assessed.  Psychiatric: Normal judgment and insight. Alert and oriented x 3. Normal mood but slightly flat affect.   Data Reviewed: I have personally reviewed following labs and imaging studies  CBC: Recent Labs  Lab 12/30/19 0821 12/31/19 0518 01/01/20 0533 01/02/20 0703 01/03/20 0109  WBC 11.4* 10.9* 11.9* 10.0 9.2  NEUTROABS 7.9* 7.5 8.5* 6.8 5.8  HGB 11.9* 11.8* 12.2 11.9* 11.6*  HCT 35.5* 35.8* 36.4 35.7* 34.8*  MCV 90.3 92.0 90.3 91.1 91.6  PLT 158 172 195 223 875   Basic Metabolic Panel: Recent Labs  Lab 12/30/19 0821 12/31/19 0518 01/01/20 0533 01/02/20 0703 01/03/20 0109  NA 137 134* 133* 133* 134*  K 3.7 3.9 3.9 3.9 4.3  CL 99 97* 96* 93* 96*  CO2 $Re'29 28 26 29 27  'iNt$ GLUCOSE 113* 107* 109* 116* 114*  BUN <5* <5* 7* 8 10  CREATININE 0.60 0.48 0.53 0.67 0.55  CALCIUM 8.4* 8.3* 8.5* 8.7* 8.5*  MG 1.9 2.0 2.3 2.3 2.4  PHOS 2.6 2.8 3.7 3.1 2.7   GFR: Estimated Creatinine Clearance: 73.7 mL/min (by C-G formula based on SCr of 0.55 mg/dL). Liver Function Tests: Recent Labs  Lab 12/30/19 6433 12/31/19 0518 01/01/20  0533 01/02/20 0703 01/03/20 0109  AST $Re'18  19 19 29 'fST$ 41  ALT $Re'13 14 16 23 'Jzq$ 33  ALKPHOS 50 47 48 49 57  BILITOT 0.9 0.7 0.8 0.8 0.7  PROT 5.9* 5.7* 6.4* 6.8 6.7  ALBUMIN 3.1* 3.0* 3.1* 3.3* 3.3*   No results for input(s): LIPASE, AMYLASE in the last 168 hours. No results for input(s): AMMONIA in the last 168 hours. Coagulation Profile: Recent Labs  Lab 01/01/20 2353  INR 1.3*   Cardiac Enzymes: No results for input(s): CKTOTAL, CKMB, CKMBINDEX, TROPONINI in the last 168 hours. BNP (last 3 results) No results for input(s): PROBNP in the last 8760 hours. HbA1C: No results for input(s): HGBA1C in the last 72 hours. CBG: No results for input(s): GLUCAP in the last 168 hours. Lipid Profile: No results for input(s): CHOL, HDL, LDLCALC, TRIG, CHOLHDL, LDLDIRECT in the last 72 hours. Thyroid Function Tests: No results for input(s): TSH, T4TOTAL, FREET4, T3FREE, THYROIDAB in the last 72 hours. Anemia Panel: No results for input(s): VITAMINB12, FOLATE, FERRITIN, TIBC, IRON, RETICCTPCT in the last 72 hours. Sepsis Labs: Recent Labs  Lab 12/30/19 0821 12/30/19 1047 12/31/19 0518  PROCALCITON <0.10  --  <0.10  LATICACIDVEN 0.7 0.8  --     Recent Results (from the past 240 hour(s))  Blood culture (routine x 2)     Status: None   Collection Time: 12/26/19 10:03 PM   Specimen: BLOOD  Result Value Ref Range Status   Specimen Description   Final    BLOOD RIGHT ARM Performed at [facility] [address] [place], [place] [address]    Special Requests   Final    BOTTLES DRAWN AEROBIC AND ANAEROBIC Blood Culture results may not be optimal due to an inadequate volume of blood received in culture bottles Performed at [facility] [address] [place], [place] [address]    Culture   Final    NO GROWTH 5 DAYS Performed at [facility], [place] [address], [place], [place] [address]    Report Status 12/31/2019 FINAL  Final  Blood culture (routine x 2)     Status: None   Collection  Time: 12/26/19 10:03 PM   Specimen: BLOOD  Result Value Ref Range Status   Specimen Description   Final    BLOOD LEFT ARM Performed at [facility] [address], [place], [place] [address]    Special Requests   Final    BOTTLES DRAWN AEROBIC AND ANAEROBIC Blood Culture results may not be optimal due to an inadequate volume of blood received in culture bottles Performed at [facility] [address] [place], [place] [address]    Culture   Final    NO GROWTH 5 DAYS Performed at [facility], [place] [address], [place],  [address]    Report Status 12/31/2019 FINAL  Final  Gastrointestinal Panel by PCR , Stool     Status: Abnormal   Collection Time: 12/26/19 11:33 PM   Specimen: Stool  Result Value Ref Range Status   Campylobacter species NOT DETECTED NOT DETECTED Final   Plesimonas shigelloides NOT DETECTED NOT DETECTED Final   Salmonella species NOT DETECTED NOT DETECTED Final   Yersinia enterocolitica NOT DETECTED NOT DETECTED Final   Vibrio species NOT DETECTED NOT DETECTED Final   Vibrio cholerae NOT DETECTED NOT DETECTED Final   Enteroaggregative E coli (EAEC) NOT DETECTED NOT DETECTED Final   Enteropathogenic E coli (EPEC)  DETECTED (A) NOT DETECTED Final    Comment: RESULT CALLED TO, READ BACK BY AND VERIFIED WITH: JT TAYLOR $RemoveB'@1631'[id number]$  12/28/19 MJU    Enterotoxigenic E coli (ETEC) NOT DETECTED NOT DETECTED Final   Shiga like toxin producing E coli (STEC) NOT DETECTED NOT DETECTED Final   Shigella/Enteroinvasive E coli (EIEC) NOT DETECTED NOT DETECTED Final   Cryptosporidium NOT DETECTED NOT DETECTED Final   Cyclospora cayetanensis NOT DETECTED NOT DETECTED Final   Entamoeba histolytica NOT DETECTED NOT DETECTED Final   Giardia lamblia NOT DETECTED NOT DETECTED Final   Adenovirus F40/41 NOT DETECTED NOT DETECTED Final   Astrovirus NOT DETECTED NOT DETECTED Final   Norovirus GI/GII NOT DETECTED NOT DETECTED Final   Rotavirus A  NOT DETECTED NOT DETECTED Final   Sapovirus (I, II, IV, and V) NOT DETECTED NOT DETECTED Final    Comment: Performed at [facility], [address] [place], [place] [address]  Respiratory Panel by RT PCR (Flu A&B, Covid) - Nasopharyngeal Swab     Status: None   Collection Time: 12/27/19 12:20 AM   Specimen: Nasopharyngeal Swab  Result Value Ref Range Status   SARS Coronavirus 2 by RT PCR NEGATIVE NEGATIVE Final    Comment: (NOTE) SARS-CoV-2 target nucleic acids are NOT DETECTED.  The SARS-CoV-2 RNA is generally detectable in upper respiratoy specimens during the acute phase of infection. The lowest concentration of SARS-CoV-2 viral copies this assay can detect is 131 copies/mL. A negative result does not preclude SARS-Cov-2 infection and should not be used as the sole basis for treatment or other patient management decisions. A negative result may occur with  improper specimen collection/handling, submission of specimen other than nasopharyngeal swab, presence of viral mutation(s) within the areas targeted by this assay, and inadequate number of viral copies (<131 copies/mL). A negative result must be combined with clinical observations, patient history, and epidemiological information. The expected result is Negative.  Fact Sheet for Patients:  PinkCheek.be  Fact Sheet for Healthcare Providers:  GravelBags.it  This test is no t yet approved or cleared by the [place] FDA and  has been authorized for detection and/or diagnosis of SARS-CoV-2 by FDA under an Emergency Use Authorization (EUA). This EUA will remain  in effect (meaning this test can be used) for the duration of the COVID-19 declaration under Section 564(b)(1) of the Act, 21 U.S.C. section 360bbb-3(b)(1), unless the authorization is terminated or revoked sooner.     Influenza A by PCR NEGATIVE NEGATIVE Final   Influenza B by PCR NEGATIVE  NEGATIVE Final    Comment: (NOTE) The Xpert Xpress SARS-CoV-2/FLU/RSV assay is intended as an aid in  the diagnosis of influenza from Nasopharyngeal swab specimens and  should not be used as a sole basis for treatment. Nasal washings and  aspirates are unacceptable for Xpert Xpress SARS-CoV-2/FLU/RSV  testing.  Fact Sheet for Patients: PinkCheek.be  Fact Sheet for Healthcare Providers: GravelBags.it  This test is not yet approved or cleared by the [place] FDA and  has been authorized for detection and/or diagnosis of SARS-CoV-2 by  FDA under an Emergency Use Authorization (EUA). This EUA will remain  in effect (meaning this test can be used) for the duration of the  Covid-19 declaration under Section 564(b)(1) of the Act, 21  U.S.C. section 360bbb-3(b)(1), unless the authorization is  terminated or revoked. Performed at [facility], [place] [address], [place], [place] [address]   C Difficile Quick Screen w PCR reflex  Status: None   Collection Time: 12/27/19 12:13 PM   Specimen: STOOL  Result Value Ref Range Status   C Diff antigen NEGATIVE NEGATIVE Final   C Diff toxin NEGATIVE NEGATIVE Final   C Diff interpretation No C. difficile detected.  Final    Comment: Performed at [facility], [place] [address], [place], [place] [address]  Culture, blood (routine x 2)     Status: None (Preliminary result)   Collection Time: 12/30/19 12:47 PM   Specimen: BLOOD LEFT HAND  Result Value Ref Range Status   Specimen Description   Final    BLOOD LEFT HAND Performed at [facility] [address] [place], [place] [address]    Special Requests   Final    BOTTLES DRAWN AEROBIC AND ANAEROBIC Blood Culture adequate volume Performed at [facility] [address], [place], [place] [address]    Culture   Final    NO GROWTH 4 DAYS Performed at [facility], [place] [address] [place], [place] [address]    Report Status PENDING  Incomplete  Culture, blood (routine x 2)     Status: None (Preliminary result)   Collection Time: 12/30/19 12:48 PM   Specimen: BLOOD  Result Value Ref Range Status   Specimen Description   Final    BLOOD RIGHT ANTECUBITAL Performed at [facility] [address], [place], [place] [address]    Special Requests   Final    BOTTLES DRAWN AEROBIC AND ANAEROBIC Blood Culture adequate volume Performed at [facility] [address], [place], [place] [address]    Culture   Final    NO GROWTH 4 DAYS Performed at [facility], [place] [address], [place], [place] [address]    Report Status PENDING  Incomplete  Culture, blood (routine x 2)     Status: None (Preliminary result)   Collection Time: 01/01/20  7:17 PM   Specimen: BLOOD  Result Value Ref Range Status   Specimen Description   Final    BLOOD LEFT ANTECUBITAL Performed at [facility] [address], [place], [place] [address]    Special Requests   Final    BOTTLES DRAWN AEROBIC AND ANAEROBIC Blood Culture adequate volume Performed at [facility] [address], [place], [place] [address]    Culture   Final    NO GROWTH 2 DAYS Performed at [facility] [address], [place], [place] [address]    Report Status PENDING  Incomplete  Culture, blood (routine x 2)     Status: None (Preliminary result)   Collection Time: 01/01/20  7:18 PM   Specimen: BLOOD LEFT HAND  Result Value Ref Range Status   Specimen Description   Final    BLOOD LEFT HAND Performed at [facility] [address] [place], [place] [address]    Special Requests   Final    BOTTLES DRAWN AEROBIC AND ANAEROBIC Blood Culture adequate volume Performed at [facility] [address], [place], [place] [address]    Culture   Final    NO GROWTH 2  DAYS Performed at [facility] [address] [place], [place] [address]    Report Status PENDING  Incomplete   RN Pressure Injury Documentation:     Estimated body mass index is 39.48 kg/m as calculated from the following:   Height as of this encounter:  5' (1.524 m).   Weight as of this encounter: 91.7 kg.  Malnutrition Type:      Malnutrition Characteristics:      Nutrition Interventions:    Radiology Studies: CT ANGIO CHEST PE W OR WO CONTRAST  Result Date: 01/01/2020 CLINICAL DATA:  Chest pain and shortness of breath EXAM: CT ANGIOGRAPHY CHEST WITH CONTRAST TECHNIQUE: Multidetector CT imaging of the chest was performed using the standard protocol during bolus administration of intravenous contrast. Multiplanar CT image reconstructions and MIPs were obtained to evaluate the vascular anatomy. CONTRAST:  152mL OMNIPAQUE IOHEXOL 350 MG/ML SOLN COMPARISON:  None. FINDINGS: Cardiovascular: There is a optimal opacification of the pulmonary arteries. There is no central or segmental filling defect. There appears to be a partially occlusive filling defect in the posterior right lower lobe subsegmental arterial branch. There is mild cardiomegaly. No pericardial effusion or thickening. No evidence right heart strain. There is normal three-vessel brachiocephalic anatomy without proximal stenosis. The thoracic aorta is normal in appearance. Mediastinum/Nodes: No hilar, mediastinal, or axillary adenopathy. 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 This was visualized on recent ultrasound of December 18, 2019 and is unchanged. Lungs/Pleura: There is a trace right pleural effusion with adjacent patchy airspace opacities at both lung bases. Upper Abdomen: No acute abnormalities present in the visualized portions of the upper abdomen. Musculoskeletal: No chest wall abnormality. No acute or significant osseous findings.  Review of the MIP images confirms the above findings. IMPRESSION: 1. Partially occlusive posterior right lower lobe subsegmental pulmonary embolism. No evidence of right ventricular heart strain. 2. Trace right pleural effusion with adjacent basilar atelectasis. 3. Unchanged bilateral thyroid nodules. These results will be called to the ordering clinician or representative by the Radiologist Assistant, and communication documented in the PACS or [employer]. Electronically Signed   By: Prudencio Pair M.D.   On: 01/01/2020 22:41   DG CHEST PORT 1 VIEW  Result Date: 01/02/2020 CLINICAL DATA:  Shortness of breath.  Pulmonary embolus. EXAM: PORTABLE CHEST 1 VIEW COMPARISON:  01/01/2020 FINDINGS: Normal heart size. Decreased lung volumes with mild asymmetric elevation of right hemidiaphragm. Bibasilar atelectasis again noted. No airspace consolidation. No pleural effusion or edema identified. IMPRESSION: Decreased lung volumes with bibasilar atelectasis Electronically Signed   By: Kerby Moors M.D.   On: 01/02/2020 06:31   ECHOCARDIOGRAM COMPLETE  Result Date: 01/02/2020    ECHOCARDIOGRAM REPORT   Patient Name:   Jamie Kelley Date of Exam: 01/02/2020 Medical Rec #:  [id number]  Height:       60.0 in Accession #:    [id number] Weight:       202.2 lb Date of Birth:  Sep 02, 1955  BSA:          1.875 m Patient Age:    72 years   BP:           143/84 mmHg Patient Gender: F          HR:           84 bpm. Exam Location:  Inpatient Procedure: 2D Echo and Strain Analysis Indications:    Pulmonary Embolus 415.19 / I26.99  History:        Patient has no prior history of Echocardiogram examinations.                 Risk Factors:Hypertension and Dyslipidemia. Acute colitis due to  enteropathogenic E. Coli with severe sepsis. Acute Respiratory                 Failure with Hypoxia                 possible pneumonia. Normocytic Anemia.  Sonographer:    Darlina Sicilian RDCS Referring Phys: [id number] Georgina Quint LATIF [place]  1. Left ventricular ejection fraction, by estimation, is 65 to 70%. The left ventricle has normal function. The left ventricle has no regional wall motion abnormalities. There is mild left ventricular hypertrophy. Left ventricular diastolic parameters are indeterminate. Global longitudinal strain calculation does not appear accurate (endocardial borders not tracked adequately) and is not reported.  2. Right ventricular systolic function is normal. The right ventricular size is normal. There is normal pulmonary artery systolic pressure. The estimated right ventricular systolic pressure is 40.3 mmHg.  3. The mitral valve is grossly normal. Trivial mitral valve regurgitation.  4. The aortic valve is tricuspid. Aortic valve regurgitation is not visualized.  5. The inferior vena cava is normal in size with greater than 50% respiratory variability, suggesting right atrial pressure of 3 mmHg. FINDINGS  Left Ventricle: Left ventricular ejection fraction, by estimation, is 65 to 70%. The left ventricle has normal function. The left ventricle has no regional wall motion abnormalities. The left ventricular internal cavity size was normal in size. There is  mild left ventricular hypertrophy. Left ventricular diastolic parameters are indeterminate. Right Ventricle: The right ventricular size is normal. No increase in right ventricular wall thickness. Right ventricular systolic function is normal. There is normal pulmonary artery systolic pressure. The tricuspid regurgitant velocity is 2.57 m/s, and  with an assumed right atrial pressure of 3 mmHg, the estimated right ventricular systolic pressure is 47.4 mmHg. Left Atrium: Left atrial size was normal in size. Right Atrium: Right atrial size was normal in size. Pericardium: There is no evidence of pericardial effusion. Mitral Valve: The mitral valve is grossly normal. Trivial mitral valve regurgitation. Tricuspid Valve: The tricuspid valve is grossly normal.  Tricuspid valve regurgitation is mild. Aortic Valve: The aortic valve is tricuspid. Aortic valve regurgitation is not visualized. Pulmonic Valve: The pulmonic valve was grossly normal. Pulmonic valve regurgitation is trivial. Aorta: The aortic root is normal in size and structure. Venous: The inferior vena cava is normal in size with greater than 50% respiratory variability, suggesting right atrial pressure of 3 mmHg. IAS/Shunts: No atrial level shunt detected by color flow Doppler.  LEFT VENTRICLE PLAX 2D LVIDd:         3.50 cm  Diastology LVIDs:         2.20 cm  LV e' medial:    5.66 cm/s LV PW:         1.00 cm  LV E/e' medial:  9.6 LV IVS:        1.20 cm  LV e' lateral:   5.33 cm/s LVOT diam:     1.80 cm  LV E/e' lateral: 10.2 LV SV:         46 LV SV Index:   24 LVOT Area:     2.54 cm  LEFT ATRIUM             Index       RIGHT ATRIUM          Index LA diam:        3.10 cm 1.65 cm/m  RA Area:     9.50 cm LA Vol (A2C):   25.1 ml  13.39 ml/m RA Volume:   18.50 ml 9.87 ml/m LA Vol (A4C):   31.0 ml 16.53 ml/m LA Biplane Vol: 28.1 ml 14.99 ml/m  AORTIC VALVE LVOT Vmax:   120.00 cm/s LVOT Vmean:  83.900 cm/s LVOT VTI:    0.179 m  AORTA Ao Root diam: 3.10 cm Ao Asc diam:  3.00 cm MITRAL VALVE               TRICUSPID VALVE MV Area (PHT): 5.38 cm    TR Peak grad:   26.4 mmHg MV Decel Time: 141 msec    TR Vmax:        257.00 cm/s MV E velocity: 54.50 cm/s MV A velocity: 77.80 cm/s  SHUNTS MV E/A ratio:  0.70        Systemic VTI:  0.18 m                            Systemic Diam: 1.80 cm Rozann Lesches MD Electronically signed by Rozann Lesches MD Signature Date/Time: 01/02/2020/1:01:47 PM    Final    Scheduled Meds: . guaiFENesin  1,200 mg Oral BID   Continuous Infusions: . azithromycin 500 mg (01/03/20 1135)  . cefTRIAXone (ROCEPHIN)  IV 1 g (01/03/20 0911)  . heparin 1,300 Units/hr (01/03/20 0200)   Raiford Noble, DO [facility] PAGER is on [place]  If 7PM-7AM, please contact  night-coverage [email or web address]

## 2020-01-03 NOTE — Progress Notes (Signed)
Pt had an order to walk down the hallway with pulse ox. Pt walked a 5 minute walk from room to down both hallways, once. Pt started off, sitting, at 92% oxygen and at 95 pulse. When first began walking, pt maintained at 90% oxygen and at 100 pulse. However, as soon as patient walked down the other hallway, they dropped down to 88% oxygen, when talking and walking. Told pt that we would walk back into her room due to low sp02. Pt back in room, before sitting back down, pt standing was 120 pulse and 95% oxygen. RN Dreama Saa., notified.

## 2020-01-04 ENCOUNTER — Telehealth: Payer: Self-pay

## 2020-01-04 ENCOUNTER — Telehealth: Payer: Self-pay | Admitting: Hematology

## 2020-01-04 DIAGNOSIS — J9601 Acute respiratory failure with hypoxia: Secondary | ICD-10-CM

## 2020-01-04 DIAGNOSIS — I2699 Other pulmonary embolism without acute cor pulmonale: Secondary | ICD-10-CM

## 2020-01-04 LAB — COMPREHENSIVE METABOLIC PANEL
ALT: 39 U/L (ref 0–44)
AST: 41 U/L (ref 15–41)
Albumin: 3.1 g/dL — ABNORMAL LOW (ref 3.5–5.0)
Alkaline Phosphatase: 55 U/L (ref 38–126)
Anion gap: 6 (ref 5–15)
BUN: 9 mg/dL (ref 8–23)
CO2: 29 mmol/L (ref 22–32)
Calcium: 8.4 mg/dL — ABNORMAL LOW (ref 8.9–10.3)
Chloride: 98 mmol/L (ref 98–111)
Creatinine, Ser: 0.46 mg/dL (ref 0.44–1.00)
GFR, Estimated: >60 mL/min (ref >60)
Glucose, Bld: 94 mg/dL (ref 70–99)
Potassium: 4.2 mmol/L (ref 3.5–5.1)
Sodium: 133 mmol/L — ABNORMAL LOW (ref 135–145)
Total Bilirubin: 0.6 mg/dL (ref 0.3–1.2)
Total Protein: 6.5 g/dL (ref 6.5–8.1)

## 2020-01-04 LAB — CBC WITH DIFFERENTIAL/PLATELET
Abs Immature Granulocytes: 0.1 10*3/uL — ABNORMAL HIGH (ref 0.00–0.07)
Basophils Absolute: 0.1 10*3/uL (ref 0.0–0.1)
Basophils Relative: 1 %
Eosinophils Absolute: 0.2 10*3/uL (ref 0.0–0.5)
Eosinophils Relative: 3 %
HCT: 35.3 % — ABNORMAL LOW (ref 36.0–46.0)
Hemoglobin: 11.5 g/dL — ABNORMAL LOW (ref 12.0–15.0)
Immature Granulocytes: 1 %
Lymphocytes Relative: 27 %
Lymphs Abs: 2.3 10*3/uL (ref 0.7–4.0)
MCH: 30.3 pg (ref 26.0–34.0)
MCHC: 32.6 g/dL (ref 30.0–36.0)
MCV: 93.1 fL (ref 80.0–100.0)
Monocytes Absolute: 1 10*3/uL (ref 0.1–1.0)
Monocytes Relative: 12 %
Neutro Abs: 4.7 10*3/uL (ref 1.7–7.7)
Neutrophils Relative %: 56 %
Platelets: 227 10*3/uL (ref 150–400)
RBC: 3.79 MIL/uL — ABNORMAL LOW (ref 3.87–5.11)
RDW: 14 % (ref 11.5–15.5)
WBC: 8.4 10*3/uL (ref 4.0–10.5)
nRBC: 0 % (ref 0.0–0.2)

## 2020-01-04 LAB — CULTURE, BLOOD (ROUTINE X 2)
Culture: NO GROWTH
Culture: NO GROWTH
Special Requests: ADEQUATE
Special Requests: ADEQUATE

## 2020-01-04 LAB — HEPARIN LEVEL (UNFRACTIONATED): Heparin Unfractionated: 0.26 IU/mL — ABNORMAL LOW (ref 0.30–0.70)

## 2020-01-04 LAB — PHOSPHORUS: Phosphorus: 3 mg/dL (ref 2.5–4.6)

## 2020-01-04 LAB — MAGNESIUM: Magnesium: 2.2 mg/dL (ref 1.7–2.4)

## 2020-01-04 MED ORDER — APIXABAN 5 MG PO TABS: ORAL_TABLET | ORAL | 0 refills | Status: DC

## 2020-01-04 MED ORDER — DICYCLOMINE HCL 10 MG PO CAPS
10.0000 mg | ORAL_CAPSULE | Freq: Three times a day (TID) | ORAL | 0 refills | Status: DC
Start: 2020-01-04 — End: 2020-02-29

## 2020-01-04 MED ORDER — ONDANSETRON HCL 4 MG PO TABS: 4.0000 mg | ORAL_TABLET | Freq: Four times a day (QID) | ORAL | 0 refills | Status: DC | PRN

## 2020-01-04 MED ORDER — APIXABAN 5 MG PO TABS: 5.0000 mg | ORAL_TABLET | Freq: Two times a day (BID) | ORAL | Status: DC

## 2020-01-04 MED ORDER — AZITHROMYCIN 250 MG PO TABS
500.0000 mg | ORAL_TABLET | Freq: Once | ORAL | Status: AC
  Administered 2020-01-04: 500 mg via ORAL
  Filled 2020-01-04: qty 2

## 2020-01-04 MED ORDER — APIXABAN 5 MG PO TABS
10.0000 mg | ORAL_TABLET | Freq: Two times a day (BID) | ORAL | Status: DC
  Administered 2020-01-04: 10 mg via ORAL
  Filled 2020-01-04: qty 2

## 2020-01-04 MED ORDER — GUAIFENESIN ER 600 MG PO TB12: 600.0000 mg | ORAL_TABLET | Freq: Two times a day (BID) | ORAL | 0 refills | Status: AC

## 2020-01-04 NOTE — TOC Benefit Eligibility Note (Signed)
Transition of Care [facility]) Benefit Eligibility Note    Patient Details  Name: Jamie Kelley MRN: [id number] Date of Birth: March 08, 1955   Medication/Dose: Eliquis 10 mg bid x 7 days and $Remove'5mg'kkORgSv$  bid and Xarelto $RemoveBef'15mg'gkfOMIqzAu$  x 21 days  Covered?: Yes  Tier: 3 Drug  Prescription Coverage Preferred Pharmacy: local  Spoke with Person/Company/Phone Number:: Jim/ [employer] [facility] [phone]  Co-Pay: $200.00 for Eliquis and Xarelto, generic's not on formulary  Prior Approval:  (Xarelto requires prior auth [id number])  Deductible: Met       [name] Phone Number: 01/04/2020, 12:13 PM

## 2020-01-04 NOTE — Progress Notes (Signed)
ANTICOAGULATION CONSULT NOTE   Pharmacy Consult for heparin Indication: pulmonary embolus  No Known Allergies  Patient Measurements: Height: 5' (152.4 cm) Weight: 91.7 kg (202 lb 2.6 oz) IBW/kg (Calculated) : 45.5 Heparin Dosing Weight: 67kg  Vital Signs: Temp: 98.7 F (37.1 C) (11/08 0550) Temp Source: Oral (11/08 0550) BP: 147/83 (11/08 0550) Pulse Rate: 80 (11/08 0550)  Labs: Recent Labs     0000 01/01/20 2353 01/02/20 0703 01/02/20 1747 01/03/20 0109 01/03/20 0759 01/04/20 0543  HGB   < >  --  11.9*  --  11.6*  --  11.5*  HCT  --   --  35.7*  --  34.8*  --  35.3*  PLT  --   --  223  --  247  --  227  APTT  --  38*  --   --   --   --   --   LABPROT  --  15.2  --   --   --   --   --   INR  --  1.3*  --   --   --   --   --   HEPARINUNFRC  --   --  0.30   < > 0.38 0.34 0.26*  CREATININE  --   --  0.67  --  0.55  --  0.46   < > = values in this interval not displayed.    Estimated Creatinine Clearance: 73.7 mL/min (by C-G formula based on SCr of 0.46 mg/dL).   Medical History: Past Medical History:  Diagnosis Date  . Arthritis   . Hay fever   . High blood pressure   . Hyperlipidemia   . Hypothyroidism   . Obesity   . Thyroid disease      Assessment: 64 y.o.femalewith medical history significant ofHTN, HLD.  Pt seen for abd pain and treated for E.Coli.  Pt also diagnosed with PNA.  Pharmacy consulted to dose heparin drip for PE.  No prior AC noted.  01/04/2020   Pt will transition from heparin drip to apixaban  Hgb low but stable, PLT wnl  Scr WNL  No bleeding or infusion issues noted   Goal of Therapy:  Heparin level 0.3-0.7 units/ml Monitor platelets by anticoagulation protocol   Plan:   Stop heparin drip and start apixaban 10 mg PO BID x 7 days then apixaban 5 mg PO BID   Monitor SCr, CBC as available  Monitor for signs and symptoms of bleeding  Will provide education and coupon for pt  Royetta Asal, PharmD, BCPS 01/04/2020  8:16 AM

## 2020-01-04 NOTE — Discharge Summary (Signed)
Physician Discharge Summary  Jamie Kelley DPO:[id number] DOB: September 17, 1955 DOA: 12/26/2019  PCP: Midge Minium, MD  Admit date: 12/26/2019 Discharge date: 01/04/2020  Admitted From: Home Disposition: Home  Recommendations for Outpatient Follow-up:  1. Follow up with PCP in 1-2 weeks 2. Follow up with Dr. Irene Limbo on Nov 30 at 11:00 3. Please obtain CMP/CBC, Mag, Phos in one week 4. Please follow up on the following pending results:  Home Health: No Equipment/Devices: None    Discharge Condition: Stable CODE STATUS: FULL CODE Diet recommendation: Heart Healthy Carb Modified Diet   Brief/Interim Summary: HPI per Dr. Jennette Kettle on 12/26/19 Jamie Kelley a 64 y.o.femalewith medical history significant ofHTN, HLD.  Pt seen earlier today for abd pain. Onset 2 days ago. Diffuse abd cramping, diarrhea. Diarrhea is bloody. No h/o diverticulitis nor colitis.  No fever, N/V.  Discharged home with cipro/flagyl. Pain has persisted and pt returns to ED.  ED Course:CT earlier today shows colitis.  HGB stable at 14.  Given cipro/flagyl and hospitalist asked to admit.  **Interim History Over the last few weeks the patient was trying to lose weight and change her diet eating habits and was eating a lot of fresh salads with strep on top and smoothies.  And at times she felt on occasion does have appear to be altered.  She is found to have enteropathogenic E. coli and subsequently this morning spiked a temperature and became septic appearing.  She also went in acute hypoxic respiratory failure and a chest x-ray shows possible right base pneumonia with some cardiomegaly which is mild.  Fluid rate has been reduced and blood cultures will be obtained.  Will need to continue monitor carefully as she has a new O2 requirement and because that she met sepsis criteria this morning  12/31/19: Continues to be febrile but her abdominal pain is improving.  We will transition her off of the  ciprofloxacin to IV ceftriaxone and azithromycin given that she likely has pneumonia.  Will need to continue monitor her temperature curve and WBC.  Will need to attempt to wean her oxygen requirement.  Her procalcitonin level is less than 0.10  01/01/20: Still remains febrile and WBC slightly elevated. Will continue IV Abx and OOB and Ambulate.   01/02/20: Last night she started spike another temperature so CT of the chest was done and showed an acute PE. Will further work-up for PE and obtain echocardiogram and lower extremity venous duplex. She has not been started on a heparin drip will transition to oral anticoagulants in the next day or so  01/03/20: ECHO Showed a normal EF but indeterminate Diastolic Parameters. Patient was feeling better and weaned off of O2; Today is day 2 of Heparin gtt and will change to po Anticoagulation in the AM and will await the LE venous Duplex.   01/04/2020: Lower extremity duplex was negative for DVT.  She was transitioned off of a heparin drip to p.o. apixaban.  She did not desaturate on home O2 screen.  She improved and was deemed stable for discharge and will need to follow-up with PCP as well as medical oncology in outpatient setting for a hypercoagulable work-up.  A referral was made for her to see Dr. Irene Limbo on Nov 30 at 11:00 AM.  Able to be discharged at this time.  Discharge Diagnoses:  Principal Problem:   Colitis presumed infectious Active Problems:   HTN (hypertension)   BRBPR (bright red blood per rectum)   Colitis  Acute colitis due to  enteropathogenic E. Coli, improved  -Completed antibiotic course and received a 10-day total course.  She received ciprofloxacin until 11 4 and this was changed to IV ceftriaxone and azithromycin and this was complete today. -Overall pain is improving -Will Continue trial oral pain regimen today and if tolerates without need for further IV pain meds  -C. difficile was negative -Normal crampy abdominal  pain -continue to monitor closely and continue with supportive care and continue with Bentyl 10 mg p.o. every 6 as needed for spasms, acetaminophen, 650 mg p.o./RC every 6 as needed for mild pain or fever, Oxycodone 5 mg p.o. every 4h  For breakthrough pain, Morphine 2 to 4 mg IV every 4 as needed for severe pain, antiemetics with ondansetron 4 mg p.o./IV every 6 as needed for nausea as well as -Continued with full liquid diet and advance slowly to SOFT and this was done yesterday; Tolerating diet well -No recurrent Abdominal Pain   Hypokalemia -due to diarrhea, repleted and now diarrhea resolved  -Potassium is now 4.2 -Continue to monitor and replete as necessary -Check magnesium Level daily  -Repeat CMP in a.m.  Severe Sepsis from Enteropathogenic E. coli with possible concomitant Right Basilar pneumonia, improved  -Was febrile the 12/29/19 with a TMax of 101.2, Tachycardic, Has a Leukocytosis (13.7 and trending down to 10.9 but slightly bumped to 11.9) and a Source of Infection with Enteropathogenic E Coli and went into Acute Respiratory Failure with Hypoxia; she continues to remain febrile and had a temperature of 102.7 yesterday  -IVF now stopped  -Lactic acid level on admission was 1.0 and repeat this morning was 0.7 and further repeat was 0.8 -Procalcitonin level was less than 0.10 x2 -Patient's WBC is trending downward from 13.7 -> 12.5 -> 11.4 -> 10.9 -> 11.9 -> 10.0 -> 9.2 -> 8.4 -ESR was 20 -Check blood cultures and initially they were negative and repeat showing no growth to date at 2 Days still -Repeat CXR yesterday AM showed "Normal heart size. Decreased lung volumes with mild asymmetric elevation of right hemidiaphragm. Bibasilar atelectasis again noted. No airspace consolidation. No pleural effusion or edema identified."  Acute PE, improving -CTA of the Chest Showed "Partially occlusive posterior right lower lobe subsegmental pulmonary embolism. No evidence of right  ventricular heart strain. Trace right pleural effusion with adjacent basilar atelectasis. Unchanged bilateral thyroid nodules." -Started on Heparin gtt and will continue for Now -Obtain ECHOCardiogram and LE Venous Duplex for Completion -ECHO showed EF of 60-65% with Normal LV Fxn and showed that the Right Ventricular size was normal and had a normal RV Systolic Fxn; Diastolic Parameters were indeterminate  -LE Venous duplex pending still  -Was back on O2 and will wean off -Will contact TOC for cost of Eliquis vs. Xarelto; discharged on Eliquis and give her the 30-day card and defer to PCP to change if it is too prohibitively expensive  Essential Hypertension -Blood pressures this AM was stable at 147/83 -We will continue to monitor blood pressures per protocol -If necessary will add as needed hydralazine  Acute Respiratory Failure with Hypoxia in the setting of above and possible pneumonia and now likely from an acute PE, improved  -In the setting of As above -12/30/2019 check DG Chest X-Ray and showed "Patchy mainly linear airspace opacities at the RIGHT lung base, may represent atelectasis or early infection. Mild cardiomegaly and central pulmonary vascular engorgement." -Repeat CXR 01/02/20 as above -Noted to be hypoxic on the a.m. of 11/3 and his O2 saturation's on 86%  on room air -Flutter Valve and Incentive Spirometry -SpO2: 94 % O2 Flow Rate (L/min): 2 L/min; Now off of Supplemental O2 via [place] -Added guaifenesin 1200 mg p.o. twice daily, as well as Ipratropium and Levalbuterol nebs every 6 hours -Check BNP and was 51. -Echocardiogram as below and she was given a dose of IV Lasix -Patient is +1.059 L since admission -CTA of the Chest showed "Partially occlusive posterior right lower lobe subsegmental pulmonary embolism. No evidence of right ventricular heart strain. Trace right pleural effusion with adjacent basilar atelectasis. Unchanged bilateral thyroid nodules." -Have Added  Flutter Valve and Incentive Spirometry  -Continuous Pulse Oximetry and maintain O2 saturations greater than 90% -Continue supplemental oxygen via nasal cannula and wean O2 as tolerated -We will need an ambulatory home O2 screen prior to discharge; She slightly dropped to 88%; Will re-evaluate Ambulatory Home O2 screen in the AM and she did not desaturate    Hyperglcyemia in the setting of New onset Pre-Diabetes -HbA1c was 5.9 -Patient's blood sugar has been ranging from 113-130 daily on BMP/CMP; Blood Sugar this AM was 114 -Continue monitor blood sugars carefully and if necessary will place on sensitive NovoLog/scale insulin AC  Normocytic Anemia -Patient's hemoglobin/hematocrit went from 13.1/39.0 -> 12.4/37.8 -> 11.9/35.5 -> 11.8/35.8 -> 12.2/36.4 -> 11.9/35.7 -> 11.6/34.8 at the time of discharge is 11.5/35.3 and stable -Checked Anemia panel and showed an iron level of 27, U IBC of 200, TIBC of 227, saturation ratio 12%, ferritin level 241, folate level of 10.5, vitamin B12 of 331 -likely this is dilutional drop in the setting of IV fluid resuscitation  -Continue monitor for signs and symptoms of bleeding now that she is getting IV Heparin; currently no overt bleeding noted -Repeat CBC in a.m.  Hyponatremia -Mild -Patient's sodium dropped from 137 is now 133 and relatively stable -If not improving will consider resuming IV fluid hydration -Continue monitor and trend and repeat CMP in a.m.  Obesity -Complicates overall Prognosis and Care -Estimated body mass index is 39.48 kg/m as calculated from the following:   Height as of this encounter: 5' (1.524 m).   Weight as of this encounter: 91.7 kg. -Weight Loss and Dietary Counseling given  Discharge Instructions  Discharge Instructions    Call MD for:  difficulty breathing, headache or visual disturbances   Complete by: As directed    Call MD for:  extreme fatigue   Complete by: As directed    Call MD for:  hives   Complete  by: As directed    Call MD for:  persistant dizziness or light-headedness   Complete by: As directed    Call MD for:  persistant nausea and vomiting   Complete by: As directed    Call MD for:  redness, tenderness, or signs of infection (pain, swelling, redness, odor or green/yellow discharge around incision site)   Complete by: As directed    Call MD for:  severe uncontrolled pain   Complete by: As directed    Call MD for:  temperature >100.4   Complete by: As directed    Diet - low sodium heart healthy   Complete by: As directed    Discharge instructions   Complete by: As directed    You were cared for by a hospitalist during your hospital stay. If you have any questions about your discharge medications or the care you received while you were in the hospital after you are discharged, you can call the unit and ask to speak with the  hospitalist on call if the hospitalist that took care of you is not available. Once you are discharged, your primary care physician will handle any further medical issues. Please note that NO REFILLS for any discharge medications will be authorized once you are discharged, as it is imperative that you return to your primary care physician (or establish a relationship with a primary care physician if you do not have one) for your aftercare needs so that they can reassess your need for medications and monitor your lab values.  Follow up with PCP, GI, and Hematology in the outpatient setting. Take all medications as prescribed. If symptoms change or worsen please return to the ED for evaluation   Increase activity slowly   Complete by: As directed      Allergies as of 01/04/2020   No Known Allergies     Medication List    STOP taking these medications   ciprofloxacin 500 MG tablet Commonly known as: Cipro   metroNIDAZOLE 500 MG tablet Commonly known as: Flagyl     TAKE these medications   albuterol 108 (90 Base) MCG/ACT inhaler Commonly known as:  Ventolin HFA Inhale 1-2 puffs into the lungs every 4 (four) hours as needed for wheezing or shortness of breath.   amLODipine 10 MG tablet Commonly known as: NORVASC Take 1 tablet (10 mg total) by mouth daily.   apixaban 5 MG Tabs tablet Commonly known as: ELIQUIS Take 10 mg (Two 5 mg tablets) BID x 7 days then 5 mg po BID from then onwards   atenolol 50 MG tablet Commonly known as: TENORMIN Take 2 tablets (100 mg total) by mouth daily.   dicyclomine 10 MG capsule Commonly known as: BENTYL Take 1 capsule (10 mg total) by mouth 3 (three) times daily before meals.   fluticasone 50 MCG/ACT nasal spray Commonly known as: FLONASE Place 1 spray into both nostrils 2 (two) times daily.   guaiFENesin 600 MG 12 hr tablet Commonly known as: MUCINEX Take 1 tablet (600 mg total) by mouth 2 (two) times daily for 5 days.   hydrochlorothiazide 25 MG tablet Commonly known as: HYDRODIURIL Take 1 tablet (25 mg total) by mouth daily.   levocetirizine 5 MG tablet Commonly known as: XYZAL Take 1 tablet by mouth every evening.   levothyroxine 50 MCG tablet Commonly known as: SYNTHROID TAKE 1/2 (ONE-HALF) TABLET BY MOUTH ONCE DAILY BEFORE BREAKFAST   ondansetron 4 MG tablet Commonly known as: ZOFRAN Take 1 tablet (4 mg total) by mouth every 6 (six) hours as needed for nausea.   simvastatin 20 MG tablet Commonly known as: ZOCOR Take 1 tablet (20 mg total) by mouth at bedtime.   sodium chloride 0.65 % Soln nasal spray Commonly known as: OCEAN Place 2 sprays into both nostrils every 2 (two) hours while awake for 30 days.       No Known Allergies  Consultations:  None  Procedures/Studies: CT ANGIO CHEST PE W OR WO CONTRAST  Result Date: 01/01/2020 CLINICAL DATA:  Chest pain and shortness of breath EXAM: CT ANGIOGRAPHY CHEST WITH CONTRAST TECHNIQUE: Multidetector CT imaging of the chest was performed using the standard protocol during bolus administration of intravenous contrast.  Multiplanar CT image reconstructions and MIPs were obtained to evaluate the vascular anatomy. CONTRAST:  123m OMNIPAQUE IOHEXOL 350 MG/ML SOLN COMPARISON:  None. FINDINGS: Cardiovascular: There is a optimal opacification of the pulmonary arteries. There is no central or segmental filling defect. There appears to be a partially occlusive filling defect in  the posterior right lower lobe subsegmental arterial branch. There is mild cardiomegaly. No pericardial effusion or thickening. No evidence right heart strain. There is normal three-vessel brachiocephalic anatomy without proximal stenosis. The thoracic aorta is normal in appearance. Mediastinum/Nodes: No hilar, mediastinal, or axillary adenopathy. 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 This was visualized on recent ultrasound of December 18, 2019 and is unchanged. Lungs/Pleura: There is a trace right pleural effusion with adjacent patchy airspace opacities at both lung bases. Upper Abdomen: No acute abnormalities present in the visualized portions of the upper abdomen. Musculoskeletal: No chest wall abnormality. No acute or significant osseous findings. Review of the MIP images confirms the above findings. IMPRESSION: 1. Partially occlusive posterior right lower lobe subsegmental pulmonary embolism. No evidence of right ventricular heart strain. 2. Trace right pleural effusion with adjacent basilar atelectasis. 3. Unchanged bilateral thyroid nodules. These results will be called to the ordering clinician or representative by the Radiologist Assistant, and communication documented in the PACS or [employer]. Electronically Signed   By: Prudencio Pair M.D.   On: 01/01/2020 22:41   CT ABDOMEN PELVIS W CONTRAST  Result Date: 12/26/2019 CLINICAL DATA:  Abdominal distension, abdominal pain EXAM: CT ABDOMEN AND PELVIS WITH CONTRAST TECHNIQUE: Multidetector CT imaging of the abdomen and  pelvis was performed using the standard protocol following bolus administration of intravenous contrast. CONTRAST:  145m OMNIPAQUE IOHEXOL 300 MG/ML  SOLN COMPARISON:  None. FINDINGS: Lower chest: No acute abnormality. Hepatobiliary: No focal liver abnormality is seen. No gallstones, gallbladder wall thickening, or biliary dilatation. Pancreas: Unremarkable. Spleen: Unremarkable. Adrenals/Urinary Tract: Adrenals, kidneys, and bladder unremarkable. Stomach/Bowel: Stomach is within normal limits apart from a small hiatal hernia. Bowel is normal in caliber. There is wall thickening involving the cecum, ascending, and transverse colon. There is some thickening of the ileocecal junction. Surrounding fat infiltration is present. Normal appendix. Vascular/Lymphatic: Minor aortic atherosclerosis. No enlarged lymph nodes identified. Reproductive: Fibroid uterus.  No adnexal mass. Other: No ascites.  Abdominal wall is unremarkable. Musculoskeletal: Degenerative changes of the included spine. No acute osseous abnormality. IMPRESSION: Right and transverse colonic wall thickening is likely infectious/inflammatory in etiology. There is also some involvement of ileocecal junction. Fibroid uterus. Electronically Signed   By: PMacy MisM.D.   On: 12/26/2019 07:09   DG CHEST PORT 1 VIEW  Result Date: 01/02/2020 CLINICAL DATA:  Shortness of breath.  Pulmonary embolus. EXAM: PORTABLE CHEST 1 VIEW COMPARISON:  01/01/2020 FINDINGS: Normal heart size. Decreased lung volumes with mild asymmetric elevation of right hemidiaphragm. Bibasilar atelectasis again noted. No airspace consolidation. No pleural effusion or edema identified. IMPRESSION: Decreased lung volumes with bibasilar atelectasis Electronically Signed   By: TKerby MoorsM.D.   On: 01/02/2020 06:31   DG CHEST PORT 1 VIEW  Result Date: 01/01/2020 CLINICAL DATA:  Shortness of breath. EXAM: PORTABLE CHEST 1 VIEW COMPARISON:  12/31/2019. FINDINGS: Cardiomegaly. No  pulmonary venous congestion. Low lung volumes with bibasilar atelectasis. Mild right perihilar and right base infiltrates cannot be excluded. No pleural effusion or pneumothorax. IMPRESSION: 1. Cardiomegaly. No pulmonary venous congestion. 2. Low lung volumes with bibasilar atelectasis. Mild right perihilar and right base infiltrates cannot be excluded on today's exam. Electronically Signed   By: TMarcello Moores Register   On: 01/01/2020 06:51   DG CHEST PORT 1 VIEW  Result Date: 12/31/2019 CLINICAL DATA:  Fever. EXAM: PORTABLE CHEST 1 VIEW COMPARISON:  December 30, 2019. FINDINGS: Stable cardiomegaly. No pneumothorax or pleural  effusion is noted. Left lung is clear. Mild right basilar subsegmental atelectasis is noted. Bony thorax is unremarkable. IMPRESSION: Mild right basilar subsegmental atelectasis. Electronically Signed   By: Marijo Conception M.D.   On: 12/31/2019 08:26   DG CHEST PORT 1 VIEW  Result Date: 12/30/2019 CLINICAL DATA:  64 year old with history of hypertension question of colitis on recent CT EXAM: PORTABLE CHEST 1 VIEW COMPARISON:  11/24/2008 FINDINGS: Low normal lung volumes. Mild cardiac enlargement accentuated by low lung volumes. Mild central pulmonary vascular engorgement. Mild elevation of the RIGHT hemidiaphragm relative to the LEFT is similar to what is seen on the recent CT evaluation. Patchy mainly linear airspace opacities at the RIGHT lung base. No lobar consolidative changes. No sign of pleural effusion. On limited assessment no acute skeletal process. IMPRESSION: Patchy mainly linear airspace opacities at the RIGHT lung base, may represent atelectasis or early infection. Mild cardiomegaly and central pulmonary vascular engorgement. Electronically Signed   By: Zetta Bills M.D.   On: 12/30/2019 10:40   ECHOCARDIOGRAM COMPLETE  Result Date: 01/02/2020    ECHOCARDIOGRAM REPORT   Patient Name:   Jamie Kelley Date of Exam: 01/02/2020 Medical Rec #:  [id number]  Height:       60.0 in  Accession #:    [id number] Weight:       202.2 lb Date of Birth:  12-Nov-1955  BSA:          1.875 m Patient Age:    64 years   BP:           143/84 mmHg Patient Gender: F          HR:           84 bpm. Exam Location:  Inpatient Procedure: 2D Echo and Strain Analysis Indications:    Pulmonary Embolus 415.19 / I26.99  History:        Patient has no prior history of Echocardiogram examinations.                 Risk Factors:Hypertension and Dyslipidemia. Acute colitis due to                 enteropathogenic E. Coli with severe sepsis. Acute Respiratory                 Failure with Hypoxia                 possible pneumonia. Normocytic Anemia.  Sonographer:    Darlina Sicilian RDCS Referring Phys: [id number] Georgina Quint LATIF [place]  1. Left ventricular ejection fraction, by estimation, is 65 to 70%. The left ventricle has normal function. The left ventricle has no regional wall motion abnormalities. There is mild left ventricular hypertrophy. Left ventricular diastolic parameters are indeterminate. Global longitudinal strain calculation does not appear accurate (endocardial borders not tracked adequately) and is not reported.  2. Right ventricular systolic function is normal. The right ventricular size is normal. There is normal pulmonary artery systolic pressure. The estimated right ventricular systolic pressure is 59.5 mmHg.  3. The mitral valve is grossly normal. Trivial mitral valve regurgitation.  4. The aortic valve is tricuspid. Aortic valve regurgitation is not visualized.  5. The inferior vena cava is normal in size with greater than 50% respiratory variability, suggesting right atrial pressure of 3 mmHg. FINDINGS  Left Ventricle: Left ventricular ejection fraction, by estimation, is 65 to 70%. The left ventricle has normal function. The left ventricle has no regional wall motion abnormalities. The left ventricular  internal cavity size was normal in size. There is  mild left ventricular hypertrophy. Left  ventricular diastolic parameters are indeterminate. Right Ventricle: The right ventricular size is normal. No increase in right ventricular wall thickness. Right ventricular systolic function is normal. There is normal pulmonary artery systolic pressure. The tricuspid regurgitant velocity is 2.57 m/s, and  with an assumed right atrial pressure of 3 mmHg, the estimated right ventricular systolic pressure is 16.1 mmHg. Left Atrium: Left atrial size was normal in size. Right Atrium: Right atrial size was normal in size. Pericardium: There is no evidence of pericardial effusion. Mitral Valve: The mitral valve is grossly normal. Trivial mitral valve regurgitation. Tricuspid Valve: The tricuspid valve is grossly normal. Tricuspid valve regurgitation is mild. Aortic Valve: The aortic valve is tricuspid. Aortic valve regurgitation is not visualized. Pulmonic Valve: The pulmonic valve was grossly normal. Pulmonic valve regurgitation is trivial. Aorta: The aortic root is normal in size and structure. Venous: The inferior vena cava is normal in size with greater than 50% respiratory variability, suggesting right atrial pressure of 3 mmHg. IAS/Shunts: No atrial level shunt detected by color flow Doppler.  LEFT VENTRICLE PLAX 2D LVIDd:         3.50 cm  Diastology LVIDs:         2.20 cm  LV e' medial:    5.66 cm/s LV PW:         1.00 cm  LV E/e' medial:  9.6 LV IVS:        1.20 cm  LV e' lateral:   5.33 cm/s LVOT diam:     1.80 cm  LV E/e' lateral: 10.2 LV SV:         46 LV SV Index:   24 LVOT Area:     2.54 cm  LEFT ATRIUM             Index       RIGHT ATRIUM          Index LA diam:        3.10 cm 1.65 cm/m  RA Area:     9.50 cm LA Vol (A2C):   25.1 ml 13.39 ml/m RA Volume:   18.50 ml 9.87 ml/m LA Vol (A4C):   31.0 ml 16.53 ml/m LA Biplane Vol: 28.1 ml 14.99 ml/m  AORTIC VALVE LVOT Vmax:   120.00 cm/s LVOT Vmean:  83.900 cm/s LVOT VTI:    0.179 m  AORTA Ao Root diam: 3.10 cm Ao Asc diam:  3.00 cm MITRAL VALVE                TRICUSPID VALVE MV Area (PHT): 5.38 cm    TR Peak grad:   26.4 mmHg MV Decel Time: 141 msec    TR Vmax:        257.00 cm/s MV E velocity: 54.50 cm/s MV A velocity: 77.80 cm/s  SHUNTS MV E/A ratio:  0.70        Systemic VTI:  0.18 m                            Systemic Diam: 1.80 cm Rozann Lesches MD Electronically signed by Rozann Lesches MD Signature Date/Time: 01/02/2020/1:01:47 PM    Final    US THYROID  Result Date: 12/18/2019 CLINICAL DATA:  Thyromegaly. History of FNA biopsy mid right nodule 12/01/2014. EXAM: THYROID ULTRASOUND TECHNIQUE: Ultrasound examination of the thyroid gland and adjacent soft tissues was performed. COMPARISON:  11/12/2014 FINDINGS: Parenchymal Echotexture: Moderately heterogenous Isthmus: 2.5 cm thickness, previously 2.3 Right lobe: 5.1 x 1.4 x 2.2 cm, previously 5.2 x 2.1 x 1.8 Left lobe: 7.9 x 4.1 x 3.1 cm, previously 6.8 x 3.2 x 3.5 _________________________________________________________ Estimated total number of nodules >/= 1 cm: 4 Number of spongiform nodules >/=  2 cm not described below (TR1): 0 Number of mixed cystic and solid nodules >/= 1.5 cm not described below ([place]): 0 _________________________________________________________ Nodule # 1: Location: Isthmus; Maximum size: 4.3 cm; Other 2 dimensions: 3.5 x 2.9 cm Composition: solid/almost completely solid (2) Echogenicity: isoechoic (1) Shape: not taller-than-wide (0) Margins: ill-defined (0) Echogenic foci: none (0) ACR TI-RADS total points: 3. ACR TI-RADS risk category: TR3 (3 points). ACR TI-RADS recommendations: **Given size (>/= 2.5 cm) and appearance, fine needle aspiration of this mildly suspicious nodule should be considered based on TI-RADS criteria. _________________________________________________________ Nodule # 2: 2.7 x 2.2 x 1.2 cm mid right, previously 1.6 x 1.5 x 0.8; this was previously biopsied. Nodule # 3: Prior biopsy: No Location: Right; Inferior Maximum size: 1.4 cm; Other 2 dimensions: 0.3 x 1  cm, previously, 1.3 x 1.3 x 1.1 cm Composition: mixed cystic and solid (1) Echogenicity: hypoechoic (2) Shape: not taller-than-wide (0) Margins: ill-defined (0) Echogenic foci: none (0) ACR TI-RADS total points: 3. ACR TI-RADS risk category:  TR3 (3 points). Significant change in size (>/= 20% in two dimensions and minimal increase of 2 mm): No Change in features: No Change in ACR TI-RADS risk category: No ACR TI-RADS recommendations: Stability for greater than 5 years implies benignity. No follow-up recommended. _________________________________________________________ Left lobe is enlarged and diffusely inhomogeneous as before with scattered coarse calcifications, but no discrete measurable mass or nodule. IMPRESSION: 1. Thyromegaly with multiple nodules. Recommend FNA biopsy of 4.3 cm mildly suspicious isthmic nodule. The above is in keeping with the ACR TI-RADS recommendations - J Am Coll Radiol 2017;14:587-595. Electronically Signed   By: Lucrezia Europe M.D.   On: 12/18/2019 15:45   VAS [place] LOWER EXTREMITY VENOUS (DVT)  Result Date: 01/03/2020  Lower Venous DVT Study Indications: Pulmonary embolism.  Comparison Study: No prior study Performing Technologist: Maudry Mayhew MHA, RDMS, RVT, RDCS  Examination Guidelines: A complete evaluation includes B-mode imaging, spectral Doppler, color Doppler, and power Doppler as needed of all accessible portions of each vessel. Bilateral testing is considered an integral part of a complete examination. Limited examinations for reoccurring indications may be performed as noted. The reflux portion of the exam is performed with the patient in reverse Trendelenburg.  +---------+---------------+---------+-----------+----------+--------------+  RIGHT     Compressibility Phasicity Spontaneity Properties Thrombus Aging  +---------+---------------+---------+-----------+----------+--------------+  CFV       Full            Yes       Yes                                     +---------+---------------+---------+-----------+----------+--------------+  SFJ       Full                                                             +---------+---------------+---------+-----------+----------+--------------+  FV Prox   Full                                                             +---------+---------------+---------+-----------+----------+--------------+  FV Mid    Full                                                             +---------+---------------+---------+-----------+----------+--------------+  FV Distal Full                                                             +---------+---------------+---------+-----------+----------+--------------+  PFV       Full                                                             +---------+---------------+---------+-----------+----------+--------------+  POP       Full            Yes       Yes                                    +---------+---------------+---------+-----------+----------+--------------+  PTV       Full                                                             +---------+---------------+---------+-----------+----------+--------------+  PERO      Full                                                             +---------+---------------+---------+-----------+----------+--------------+   +---------+---------------+---------+-----------+----------+--------------+  LEFT      Compressibility Phasicity Spontaneity Properties Thrombus Aging  +---------+---------------+---------+-----------+----------+--------------+  CFV       Full            Yes       Yes                                    +---------+---------------+---------+-----------+----------+--------------+  SFJ       Full                                                             +---------+---------------+---------+-----------+----------+--------------+  FV Prox   Full                                                              +---------+---------------+---------+-----------+----------+--------------+  FV Mid    Full                                                             +---------+---------------+---------+-----------+----------+--------------+  FV Distal Full                                                             +---------+---------------+---------+-----------+----------+--------------+  PFV       Full                                                             +---------+---------------+---------+-----------+----------+--------------+  POP       Full            Yes       Yes                                    +---------+---------------+---------+-----------+----------+--------------+  PTV       Full                                                             +---------+---------------+---------+-----------+----------+--------------+  PERO      Full                                                             +---------+---------------+---------+-----------+----------+--------------+     Summary: RIGHT: - There is no evidence of deep vein thrombosis in the lower extremity.  - No cystic structure found in the popliteal fossa.  LEFT: - There is no evidence of deep vein thrombosis in the lower extremity.  - No cystic structure found in the popliteal fossa.  *See table(s) above for measurements and observations. Electronically signed by Servando Snare MD on 01/03/2020 at 8:36:44 PM.    Final      Subjective: And examined at bedside and she had much improved.  She did not desaturate while ambulating and had a bowel movement today.  Felt well and was stable for discharge at this time and will follow up with PCP as well as medical oncology for hypercoagulable work-up in outpatient setting.  Discharge Exam: Vitals:   01/03/20 2041 01/04/20 0550  BP: (!) 143/80 (!) 147/83  Pulse: 87 80  Resp: 18 18  Temp: 99.5 F (37.5 C) 98.7 F (37.1 C)  SpO2: 91% 93%   Vitals:   01/03/20 0511 01/03/20 1253 01/03/20 2041 01/04/20 0550   BP: 140/80 128/82 (!) 143/80 (!) 147/83  Pulse:  79 84 87 80  Resp: _0 Temp: 99.2 F (37.3 C) 99.8 F (37.7 C) 99.5 F (37.5 C) 98.7 F (37.1 C)  TempSrc: Oral Oral Oral Oral  SpO2: 99% 94% 91% 93%  Weight:      Height:       General: Pt is alert, awake, not in acute distress Cardiovascular: RRR, S1/S2 +, no rubs, no gallops Respiratory: Diminished bilaterally, no wheezing, no rhonchi Abdominal: Soft, NT, Distended , bowel sounds + Extremities: Trace edema, no cyanosis  The results of significant diagnostics from this hospitalization (including imaging, microbiology, ancillary and laboratory) are listed below for reference.    Microbiology: Recent Results (from the past 240 hour(s))  Blood culture (routine x 2)     Status: None   Collection Time: 12/26/19 10:03 PM   Specimen: BLOOD  Result Value Ref Range Status   Specimen Description   Final    BLOOD RIGHT ARM Performed at [facility] [address], [place], [place] [address]    Special Requests   Final    BOTTLES DRAWN AEROBIC AND ANAEROBIC Blood Culture results may not be optimal due to an inadequate volume of blood received in culture bottles Performed at [facility] [address], [place], [place] [address]    Culture   Final    NO GROWTH 5 DAYS Performed at [facility], [place] [address], [place], [place] [address]    Report Status 12/31/2019 FINAL  Final  Blood culture (routine x 2)     Status: None   Collection Time: 12/26/19 10:03 PM   Specimen: BLOOD  Result Value Ref Range Status   Specimen Description   Final    BLOOD LEFT ARM Performed at [facility] [address] [place], [place] [address]    Special Requests   Final    BOTTLES DRAWN AEROBIC AND ANAEROBIC Blood Culture results may not be optimal due to an inadequate volume of blood received in culture bottles Performed at [facility] [address], [place], [place] [address]    Culture   Final    NO GROWTH 5 DAYS Performed at [facility], [place] [address], [place],  [address]    Report Status 12/31/2019 FINAL  Final  Gastrointestinal Panel by PCR , Stool     Status: Abnormal   Collection Time: 12/26/19 11:33 PM   Specimen: Stool  Result Value Ref Range Status   Campylobacter species NOT DETECTED NOT DETECTED Final   Plesimonas shigelloides NOT DETECTED NOT DETECTED Final   Salmonella species NOT DETECTED NOT DETECTED Final   Yersinia enterocolitica NOT DETECTED NOT DETECTED Final   Vibrio species NOT DETECTED NOT DETECTED Final   Vibrio cholerae NOT DETECTED NOT DETECTED Final   Enteroaggregative E coli (EAEC) NOT DETECTED NOT DETECTED Final   Enteropathogenic E coli (EPEC) DETECTED (A) NOT DETECTED Final    Comment: RESULT CALLED TO, READ BACK BY AND VERIFIED WITH: JT TAYLOR _1  12/28/19 MJU    Enterotoxigenic E coli (ETEC) NOT DETECTED NOT DETECTED Final   Shiga like toxin producing E coli (STEC) NOT DETECTED NOT DETECTED Final   Shigella/Enteroinvasive E coli (EIEC) NOT DETECTED NOT DETECTED Final   Cryptosporidium NOT DETECTED NOT DETECTED Final   Cyclospora cayetanensis NOT DETECTED NOT DETECTED Final   Entamoeba histolytica NOT DETECTED NOT DETECTED Final   Giardia lamblia NOT DETECTED NOT DETECTED Final   Adenovirus F40/41 NOT  DETECTED NOT DETECTED Final   Astrovirus NOT DETECTED NOT DETECTED Final   Norovirus GI/GII NOT DETECTED NOT DETECTED Final   Rotavirus A NOT DETECTED NOT DETECTED Final   Sapovirus (I, II, IV, and V) NOT DETECTED NOT DETECTED Final    Comment: Performed at [facility], [address], [place], [place] [address]  Respiratory Panel by RT PCR (Flu A&B, Covid) - Nasopharyngeal Swab     Status: None   Collection Time: 12/27/19 12:20 AM   Specimen: Nasopharyngeal Swab  Result Value Ref Range Status   SARS Coronavirus 2 by RT PCR NEGATIVE NEGATIVE Final    Comment:  (NOTE) SARS-CoV-2 target nucleic acids are NOT DETECTED.  The SARS-CoV-2 RNA is generally detectable in upper respiratoy specimens during the acute phase of infection. The lowest concentration of SARS-CoV-2 viral copies this assay can detect is 131 copies/mL. A negative result does not preclude SARS-Cov-2 infection and should not be used as the sole basis for treatment or other patient management decisions. A negative result may occur with  improper specimen collection/handling, submission of specimen other than nasopharyngeal swab, presence of viral mutation(s) within the areas targeted by this assay, and inadequate number of viral copies (<131 copies/mL). A negative result must be combined with clinical observations, patient history, and epidemiological information. The expected result is Negative.  Fact Sheet for Patients:  PinkCheek.be  Fact Sheet for Healthcare Providers:  GravelBags.it  This test is no t yet approved or cleared by the [place] FDA and  has been authorized for detection and/or diagnosis of SARS-CoV-2 by FDA under an Emergency Use Authorization (EUA). This EUA will remain  in effect (meaning this test can be used) for the duration of the COVID-19 declaration under Section 564(b)(1) of the Act, 21 U.S.C. section 360bbb-3(b)(1), unless the authorization is terminated or revoked sooner.     Influenza A by PCR NEGATIVE NEGATIVE Final   Influenza B by PCR NEGATIVE NEGATIVE Final    Comment: (NOTE) The Xpert Xpress SARS-CoV-2/FLU/RSV assay is intended as an aid in  the diagnosis of influenza from Nasopharyngeal swab specimens and  should not be used as a sole basis for treatment. Nasal washings and  aspirates are unacceptable for Xpert Xpress SARS-CoV-2/FLU/RSV  testing.  Fact Sheet for Patients: PinkCheek.be  Fact Sheet for Healthcare  Providers: GravelBags.it  This test is not yet approved or cleared by the [place] FDA and  has been authorized for detection and/or diagnosis of SARS-CoV-2 by  FDA under an Emergency Use Authorization (EUA). This EUA will remain  in effect (meaning this test can be used) for the duration of the  Covid-19 declaration under Section 564(b)(1) of the Act, 21  U.S.C. section 360bbb-3(b)(1), unless the authorization is  terminated or revoked. Performed at [facility], [place] [address], [place], [place] [address]   C Difficile Quick Screen w PCR reflex     Status: None   Collection Time: 12/27/19 12:13 PM   Specimen: STOOL  Result Value Ref Range Status   C Diff antigen NEGATIVE NEGATIVE Final   C Diff toxin NEGATIVE NEGATIVE Final   C Diff interpretation No C. difficile detected.  Final    Comment: Performed at [facility], [place] [address], [place], [place] [address]  Culture, blood (routine x 2)     Status: None   Collection Time: 12/30/19 12:47 PM   Specimen: BLOOD LEFT HAND  Result Value Ref Range Status   Specimen Description  Final    BLOOD LEFT HAND Performed at [facility], [place] [address], [place], [place] [address]    Special Requests   Final    BOTTLES DRAWN AEROBIC AND ANAEROBIC Blood Culture adequate volume Performed at [facility] [address], [place], [place] [address]    Culture   Final    NO GROWTH 5 DAYS Performed at [facility], [place] [address] [place], [place] [address]    Report Status 01/04/2020 FINAL  Final  Culture, blood (routine x 2)     Status: None   Collection Time: 12/30/19 12:48 PM   Specimen: BLOOD  Result Value Ref Range Status   Specimen Description   Final    BLOOD RIGHT ANTECUBITAL Performed at [facility] [address], [place], [place] [address]    Special Requests   Final    BOTTLES DRAWN AEROBIC  AND ANAEROBIC Blood Culture adequate volume Performed at [facility] [address], [place], [place] [address]    Culture   Final    NO GROWTH 5 DAYS Performed at [facility], [place] [address] [place], [place] [address]    Report Status 01/04/2020 FINAL  Final  Culture, blood (routine x 2)     Status: None (Preliminary result)   Collection Time: 01/01/20  7:17 PM   Specimen: BLOOD  Result Value Ref Range Status   Specimen Description   Final    BLOOD LEFT ANTECUBITAL Performed at [facility] [address] [place], [place] [address]    Special Requests   Final    BOTTLES DRAWN AEROBIC AND ANAEROBIC Blood Culture adequate volume Performed at [facility] [address] [place], [place] [address]    Culture   Final    NO GROWTH 3 DAYS Performed at [facility], [place] [address] [place], [place] [address]    Report Status PENDING  Incomplete  Culture, blood (routine x 2)     Status: None (Preliminary result)   Collection Time: 01/01/20  7:18 PM   Specimen: BLOOD LEFT HAND  Result Value Ref Range Status   Specimen Description   Final    BLOOD LEFT HAND Performed at [facility] [address] [place], [place] [address]    Special Requests   Final    BOTTLES DRAWN AEROBIC AND ANAEROBIC Blood Culture adequate volume Performed at [facility] [address] [place], [place] [address]    Culture   Final    NO GROWTH 3 DAYS Performed at [facility], [place] [address], [place], [place] [address]    Report Status PENDING  Incomplete    Labs: BNP (last 3 results) Recent Labs    01/01/20 0536  BNP 79.0   Basic Metabolic Panel: Recent Labs  Lab 12/31/19 0518 01/01/20 0533 01/02/20 0703 01/03/20 0109 01/04/20 0543  NA 134* 133* 133* 134* 133*  K 3.9 3.9 3.9 4.3 4.2  CL 97* 96* 93* 96* 98  CO2 _0 GLUCOSE 107* 109* 116* 114* 94  BUN <5*  7* _1 CREATININE 0.48 0.53 0.67 0.55 0.46  CALCIUM 8.3* 8.5* 8.7* 8.5* 8.4*  MG 2.0 2.3 2.3 2.4 2.2  PHOS 2.8 3.7 3.1 2.7 3.0   Liver Function Tests: Recent Labs  Lab 12/31/19 0518 01/01/20 0533 01/02/20 0703 01/03/20 0109 01/04/20 0543  AST 19 19 29  41 41  ALT _0 33 39  ALKPHOS 47 48 49 57 55  BILITOT 0.7 0.8 0.8 0.7 0.6  PROT 5.7* 6.4* 6.8 6.7 6.5  ALBUMIN 3.0* 3.1* 3.3* 3.3* 3.1*   No results for input(s): LIPASE, AMYLASE in the last 168 hours. No results for input(s): AMMONIA in the last 168 hours. CBC: Recent Labs  Lab 12/31/19 0518 01/01/20 0533 01/02/20 0703 01/03/20 0109 01/04/20 0543  WBC 10.9* 11.9* 10.0 9.2 8.4  NEUTROABS 7.5 8.5* 6.8 5.8 4.7  HGB 11.8* 12.2 11.9* 11.6* 11.5*  HCT 35.8* 36.4 35.7* 34.8* 35.3*  MCV 92.0 90.3 91.1 91.6 93.1  PLT 172 195 223 247 227   Cardiac Enzymes: No results for input(s): CKTOTAL, CKMB, CKMBINDEX, TROPONINI in the last 168 hours. BNP: Invalid input(s): POCBNP CBG: No results for input(s): GLUCAP in the last 168 hours. D-Dimer No results for input(s): DDIMER in the last 72 hours. Hgb A1c No results for input(s): HGBA1C in the last 72 hours. Lipid Profile No results for input(s): CHOL, HDL, LDLCALC, TRIG, CHOLHDL, LDLDIRECT in the last 72 hours. Thyroid function studies No results for input(s): TSH, T4TOTAL, T3FREE, THYROIDAB in the last 72 hours.  Invalid input(s): FREET3 Anemia work up No results for input(s): VITAMINB12, FOLATE, FERRITIN, TIBC, IRON, RETICCTPCT in the last 72 hours. Urinalysis    Component Value Date/Time   COLORURINE STRAW (A) 12/26/2019 0715   APPEARANCEUR CLEAR 12/26/2019 0715   LABSPEC 1.026 12/26/2019 0715   PHURINE 6.0 12/26/2019 0715   GLUCOSEU NEGATIVE 12/26/2019 0715   HGBUR NEGATIVE 12/26/2019 0715   BILIRUBINUR NEGATIVE 12/26/2019 0715   KETONESUR 20 (A) 12/26/2019 0715   PROTEINUR NEGATIVE 12/26/2019 0715   NITRITE NEGATIVE 12/26/2019 0715   LEUKOCYTESUR NEGATIVE  12/26/2019 0715   Sepsis Labs Invalid input(s): PROCALCITONIN,  WBC,  LACTICIDVEN Microbiology Recent Results (from the past 240 hour(s))  Blood culture (routine x 2)     Status: None   Collection Time: 12/26/19 10:03 PM   Specimen: BLOOD  Result Value Ref Range Status   Specimen Description   Final    BLOOD RIGHT ARM Performed at [facility], [place] [address] [place], [place] [address]    Special Requests   Final    BOTTLES DRAWN AEROBIC AND ANAEROBIC Blood Culture results may not be optimal due to an inadequate volume of blood received in culture bottles Performed at [facility] [address], [place], [place] [address]    Culture   Final    NO GROWTH 5 DAYS Performed at [facility], [place] [address] [place], [place] [address]    Report Status 12/31/2019 FINAL  Final  Blood culture (routine x 2)     Status: None   Collection Time: 12/26/19 10:03 PM   Specimen: BLOOD  Result Value Ref Range Status   Specimen Description   Final    BLOOD LEFT ARM Performed at [facility] [address], [place], [place] [address]    Special Requests   Final    BOTTLES DRAWN AEROBIC AND ANAEROBIC Blood Culture results may not be optimal due to an inadequate volume of blood received in culture bottles Performed at [facility] [address] [place], [place] [address]    Culture   Final    NO GROWTH 5 DAYS Performed at [facility], [place] [address], [place],  [address]    Report Status 12/31/2019 FINAL  Final  Gastrointestinal  Panel by PCR , Stool     Status: Abnormal   Collection Time: 12/26/19 11:33 PM   Specimen: Stool  Result Value Ref Range Status   Campylobacter species NOT DETECTED NOT DETECTED Final   Plesimonas shigelloides NOT DETECTED NOT DETECTED Final   Salmonella species NOT DETECTED NOT DETECTED Final   Yersinia enterocolitica NOT DETECTED NOT DETECTED Final   Vibrio  species NOT DETECTED NOT DETECTED Final   Vibrio cholerae NOT DETECTED NOT DETECTED Final   Enteroaggregative E coli (EAEC) NOT DETECTED NOT DETECTED Final   Enteropathogenic E coli (EPEC) DETECTED (A) NOT DETECTED Final    Comment: RESULT CALLED TO, READ BACK BY AND VERIFIED WITH: JT TAYLOR $[email or web address]$  12/28/19 MJU    Enterotoxigenic E coli (ETEC) NOT DETECTED NOT DETECTED Final   Shiga like toxin producing E coli (STEC) NOT DETECTED NOT DETECTED Final   Shigella/Enteroinvasive E coli (EIEC) NOT DETECTED NOT DETECTED Final   Cryptosporidium NOT DETECTED NOT DETECTED Final   Cyclospora cayetanensis NOT DETECTED NOT DETECTED Final   Entamoeba histolytica NOT DETECTED NOT DETECTED Final   Giardia lamblia NOT DETECTED NOT DETECTED Final   Adenovirus F40/41 NOT DETECTED NOT DETECTED Final   Astrovirus NOT DETECTED NOT DETECTED Final   Norovirus GI/GII NOT DETECTED NOT DETECTED Final   Rotavirus A NOT DETECTED NOT DETECTED Final   Sapovirus (I, II, IV, and V) NOT DETECTED NOT DETECTED Final    Comment: Performed at [facility], [place] [place], [place] [address]  Respiratory Panel by RT PCR (Flu A&B, Covid) - Nasopharyngeal Swab     Status: None   Collection Time: 12/27/19 12:20 AM   Specimen: Nasopharyngeal Swab  Result Value Ref Range Status   SARS Coronavirus 2 by RT PCR NEGATIVE NEGATIVE Final    Comment: (NOTE) SARS-CoV-2 target nucleic acids are NOT DETECTED.  The SARS-CoV-2 RNA is generally detectable in upper respiratoy specimens during the acute phase of infection. The lowest concentration of SARS-CoV-2 viral copies this assay can detect is 131 copies/mL. A negative result does not preclude SARS-Cov-2 infection and should not be used as the sole basis for treatment or other patient management decisions. A negative result may occur with  improper specimen collection/handling, submission of specimen other than nasopharyngeal swab, presence of viral mutation(s) within  the areas targeted by this assay, and inadequate number of viral copies (<131 copies/mL). A negative result must be combined with clinical observations, patient history, and epidemiological information. The expected result is Negative.  Fact Sheet for Patients:  PinkCheek.be  Fact Sheet for Healthcare Providers:  GravelBags.it  This test is no t yet approved or cleared by the [place] FDA and  has been authorized for detection and/or diagnosis of SARS-CoV-2 by FDA under an Emergency Use Authorization (EUA). This EUA will remain  in effect (meaning this test can be used) for the duration of the COVID-19 declaration under Section 564(b)(1) of the Act, 21 U.S.C. section 360bbb-3(b)(1), unless the authorization is terminated or revoked sooner.     Influenza A by PCR NEGATIVE NEGATIVE Final   Influenza B by PCR NEGATIVE NEGATIVE Final    Comment: (NOTE) The Xpert Xpress SARS-CoV-2/FLU/RSV assay is intended as an aid in  the diagnosis of influenza from Nasopharyngeal swab specimens and  should not be used as a sole basis for treatment. Nasal washings and  aspirates are unacceptable for Xpert Xpress SARS-CoV-2/FLU/RSV  testing.  Fact Sheet for Patients: PinkCheek.be  Fact Sheet for Healthcare Providers: GravelBags.it  This test is not yet approved or cleared by the [place] and  has been authorized for detection and/or diagnosis of SARS-CoV-2 by  FDA under an Emergency Use Authorization (EUA). This EUA will remain  in effect (meaning this test can be used) for the duration of the  Covid-19 declaration under Section 564(b)(1) of the Act, 21  U.S.C. section 360bbb-3(b)(1), unless the authorization is  terminated or revoked. Performed at [facility], [place] [address] [place], [place] [address]   C Difficile Quick Screen w PCR reflex      Status: None   Collection Time: 12/27/19 12:13 PM   Specimen: STOOL  Result Value Ref Range Status   C Diff antigen NEGATIVE NEGATIVE Final   C Diff toxin NEGATIVE NEGATIVE Final   C Diff interpretation No C. difficile detected.  Final    Comment: Performed at [facility], [place] [address] [address], [place], [place] [address]  Culture, blood (routine x 2)     Status: None   Collection Time: 12/30/19 12:47 PM   Specimen: BLOOD LEFT HAND  Result Value Ref Range Status   Specimen Description   Final    BLOOD LEFT HAND Performed at [facility] [address], [place], [place] [address]    Special Requests   Final    BOTTLES DRAWN AEROBIC AND ANAEROBIC Blood Culture adequate volume Performed at [facility] [address] [place], [place] [address]    Culture   Final    NO GROWTH 5 DAYS Performed at [facility], [place] [address] [place], [place] [address]    Report Status 01/04/2020 FINAL  Final  Culture, blood (routine x 2)     Status: None   Collection Time: 12/30/19 12:48 PM   Specimen: BLOOD  Result Value Ref Range Status   Specimen Description   Final    BLOOD RIGHT ANTECUBITAL Performed at [facility] [address], [place], [place] [address]    Special Requests   Final    BOTTLES DRAWN AEROBIC AND ANAEROBIC Blood Culture adequate volume Performed at [facility] [address] [place], [place] [address]    Culture   Final    NO GROWTH 5 DAYS Performed at [facility], [place] [address], [place], [place] [address]    Report Status 01/04/2020 FINAL  Final  Culture, blood (routine x 2)     Status: None (Preliminary result)   Collection Time: 01/01/20  7:17 PM   Specimen: BLOOD  Result Value Ref Range Status   Specimen Description   Final    BLOOD LEFT ANTECUBITAL Performed at [facility] [address] [place], [place] [address]    Special  Requests   Final    BOTTLES DRAWN AEROBIC AND ANAEROBIC Blood Culture adequate volume Performed at [facility] [address], [place], [place] [address]    Culture   Final    NO GROWTH 3 DAYS Performed at [facility], [place] [address] [place], [place] [address]    Report Status PENDING  Incomplete  Culture, blood (routine x 2)     Status: None (Preliminary result)   Collection Time: 01/01/20  7:18 PM   Specimen: BLOOD LEFT HAND  Result Value Ref Range Status   Specimen Description   Final    BLOOD LEFT HAND Performed at [facility] [address] [place], [place] [address]  Special Requests   Final    BOTTLES DRAWN AEROBIC AND ANAEROBIC Blood Culture adequate volume Performed at [facility] [address] [place], [place] [address]    Culture   Final    NO GROWTH 3 DAYS Performed at [facility], [place] [address] [place], [place] [address]    Report Status PENDING  Incomplete   Time coordinating discharge: 35 minutes  SIGNED:  Kerney Elbe, DO [facility] 01/04/2020, 6:39 PM Pager is on [place]  If 7PM-7AM, please contact night-coverage [email or web address]

## 2020-01-04 NOTE — TOC Transition Note (Signed)
Transition of Care [facility]) - CM/SW Discharge Note   Patient Details  Name: Jamie Kelley MRN: [id number] Date of Birth: December 11, 1955  [facility] CM/SW Contact:  Dessa Phi, RN Phone Number: 01/04/2020, 12:44 PM   Clinical Narrative: patient already d/c prior giving info on benefit check.See prior note.      Final next level of care: Home/Self Care Barriers to Discharge: No Barriers Identified   Patient Goals and CMS Choice Patient states their goals for this hospitalization and ongoing recovery are:: home CMS Medicare.gov Compare Post Acute Care list provided to:: Patient Choice offered to / list presented to : Patient  Discharge Placement                       Discharge Plan and Services   Discharge Planning Services: CM Consult                                 Social Determinants of Health (SDOH) Interventions     Readmission Risk Interventions No flowsheet data found.

## 2020-01-04 NOTE — Telephone Encounter (Signed)
PCP: Midge Minium, MD   Admit date: 12/26/2019 Discharge date: 01/04/2020   Admitted From: Home Disposition: Home   Recommendations for Outpatient Follow-up:  Follow up with PCP in 1-2 weeks Follow up with Dr. Irene Limbo on Nov 30 at 11:00 Please obtain CMP/CBC, Mag, Phos in one week Please follow up on the following pending results:   Home Health: No Equipment/Devices: None     Discharge Condition: Stable CODE STATUS: FULL CODE Diet recommendation: Heart Healthy Carb Modified Diet     Patient discharged today.

## 2020-01-04 NOTE — Progress Notes (Signed)
SATURATION QUALIFICATIONS: (This note is used to comply with regulatory documentation for home oxygen)  Patient Saturations on Room Air at Rest = 92%  Patient Saturations on Room Air while Ambulating = 92-97%

## 2020-01-04 NOTE — Telephone Encounter (Signed)
Noted patient developed PE in hospital inpatient stay lengthened.

## 2020-01-04 NOTE — Telephone Encounter (Signed)
Received a new hem referral from the hospital for acuute pe. Pt has been cld and scheduled to see Dr. Irene Limbo on 11/30 at 11am. MD will provide the pt with the appt date and time upon discharge.

## 2020-01-04 NOTE — Telephone Encounter (Signed)
Dr. Lyndel Safe:  Pt is currently at [facility] (since 10/30) with Colitis and  Lower GI bleed.  She is scheduled for PV 11/16 with direct colon on 12/3.  She seems to be having some complications in hospital with PE.  Would you like OV before colon?  Thank you.    Kiwan [place].

## 2020-01-04 NOTE — Progress Notes (Signed)
ANTICOAGULATION CONSULT NOTE   Pharmacy Consult for heparin Indication: pulmonary embolus  No Known Allergies  Patient Measurements: Height: 5' (152.4 cm) Weight: 91.7 kg (202 lb 2.6 oz) IBW/kg (Calculated) : 45.5 Heparin Dosing Weight: 67kg  Vital Signs: Temp: 98.7 F (37.1 C) (11/08 0550) Temp Source: Oral (11/08 0550) BP: 147/83 (11/08 0550) Pulse Rate: 80 (11/08 0550)  Labs: Recent Labs     0000 01/01/20 2353 01/02/20 0703 01/02/20 1747 01/03/20 0109 01/03/20 0759 01/04/20 0543  HGB   < >  --  11.9*  --  11.6*  --  11.5*  HCT  --   --  35.7*  --  34.8*  --  35.3*  PLT  --   --  223  --  247  --  227  APTT  --  38*  --   --   --   --   --   LABPROT  --  15.2  --   --   --   --   --   INR  --  1.3*  --   --   --   --   --   HEPARINUNFRC  --   --  0.30   < > 0.38 0.34 0.26*  CREATININE  --   --  0.67  --  0.55  --   --    < > = values in this interval not displayed.    Estimated Creatinine Clearance: 73.7 mL/min (by C-G formula based on SCr of 0.55 mg/dL).   Medical History: Past Medical History:  Diagnosis Date  . Arthritis   . Hay fever   . High blood pressure   . Hyperlipidemia   . Hypothyroidism   . Obesity   . Thyroid disease      Assessment: 64 y.o.femalewith medical history significant ofHTN, HLD.  Pt seen for abd pain and treated for E.Coli.  Pt also diagnosed with PNA.  Pharmacy consulted to dose heparin drip for PE.  No prior AC noted.  01/04/2020  HL 0.26 subtherapeutic Hgb low but stable, PLT wnl Scr WNL No bleeding or infusion issues noted   Goal of Therapy:  Heparin level 0.3-0.7 units/ml Monitor platelets by anticoagulation protocol   Plan:  increase heparin rate to 1450 units/hr Check heparin level in 6 hours Daily CBC/HL  Dolly Rias RPh 01/04/2020, 6:37 AM

## 2020-01-04 NOTE — Discharge Instructions (Signed)
Information on my medicine - ELIQUIS (apixaban)  This medication education was reviewed with me or my healthcare representative as part of my discharge preparation.  The pharmacist that spoke with me during my hospital stay was:  Steva Colder, Student-PharmD  Why was Eliquis prescribed for you? Eliquis was prescribed to treat blood clots that may have been found in the veins of your legs (deep vein thrombosis) or in your lungs (pulmonary embolism) and to reduce the risk of them occurring again.  What do You need to know about Eliquis ? The starting dose is 10 mg (two 5 mg tablets) taken TWICE daily for the FIRST SEVEN (7) DAYS, then on (enter date)  01/11/2020  the dose is reduced to ONE 5 mg tablet taken TWICE daily.  Eliquis may be taken with or without food.   Try to take the dose about the same time in the morning and in the evening. If you have difficulty swallowing the tablet whole please discuss with your pharmacist how to take the medication safely.  Take Eliquis exactly as prescribed and DO NOT stop taking Eliquis without talking to the doctor who prescribed the medication.  Stopping may increase your risk of developing a new blood clot.  Refill your prescription before you run out.  After discharge, you should have regular check-up appointments with your healthcare provider that is prescribing your Eliquis.    What do you do if you miss a dose? If a dose of ELIQUIS is not taken at the scheduled time, take it as soon as possible on the same day and twice-daily administration should be resumed. The dose should not be doubled to make up for a missed dose.  Important Safety Information A possible side effect of Eliquis is bleeding. You should call your healthcare provider right away if you experience any of the following: ? Bleeding from an injury or your nose that does not stop. ? Unusual colored urine (red or dark brown) or unusual colored stools (red or  black). ? Unusual bruising for unknown reasons. ? A serious fall or if you hit your head (even if there is no bleeding).  Some medicines may interact with Eliquis and might increase your risk of bleeding or clotting while on Eliquis. To help avoid this, consult your healthcare provider or pharmacist prior to using any new prescription or non-prescription medications, including herbals, vitamins, non-steroidal anti-inflammatory drugs (NSAIDs) and supplements.  This website has more information on Eliquis (apixaban): http://www.eliquis.com/eliquis/home

## 2020-01-05 ENCOUNTER — Telehealth: Payer: Self-pay

## 2020-01-05 NOTE — Telephone Encounter (Signed)
First attempt TCM call. No answer. Will try again tomorrow.

## 2020-01-06 ENCOUNTER — Telehealth: Payer: Self-pay

## 2020-01-06 LAB — CULTURE, BLOOD (ROUTINE X 2)
Culture: NO GROWTH
Culture: NO GROWTH
Special Requests: ADEQUATE
Special Requests: ADEQUATE

## 2020-01-06 NOTE — Telephone Encounter (Signed)
Attempted to reach pt to schedule OV per Dr. Lyndel Safe instructions.  Cancelled PV and procedure.  LM for pt to call at her earliest convenience to schedule OV.  In message it was noted that her PV and Procedure has been cancelled at this time.

## 2020-01-06 NOTE — Telephone Encounter (Signed)
Transition Care Management Unsuccessful Follow-up Telephone Call  Date of discharge and from where:  01/04/20-[place]  Attempts:  3rd Attempt  Reason for unsuccessful TCM follow-up call:  Left voice message

## 2020-01-06 NOTE — Telephone Encounter (Signed)
Good idea Needs OV prior RG

## 2020-01-07 ENCOUNTER — Telehealth: Payer: Self-pay

## 2020-01-07 ENCOUNTER — Encounter: Payer: Self-pay | Admitting: Gastroenterology

## 2020-01-07 NOTE — Telephone Encounter (Signed)
Noted reviewed RN Haley note 

## 2020-01-07 NOTE — Telephone Encounter (Signed)
Follow up phone call to pt to check in.   She reports feeling okay, tired. Denies any pain. Abd pain resolved during hospital stay. Denies ShOB or "any problems with my breathing." During 8 minute phone call, discussed f/u appts with pcp, medical oncology/hematology and GI. She was not comfortable with the idea of colonoscopy. Advised her that reviewing phone notes, the GI office left her a VM yesterday notifying her that they were cancelling her PV and colonoscopy in favor of an office visit first. Gave pt GI office contact info and RN name to schedule OV.   She spoke with transitions care team this morning. She reports understanding of ER precautions, follow ups, and medications. Denies any problems with oral anticoagulants. GI bleeding resolved while hospitalized. Reviewed anticoag bleeding risks, precautions, and when to seek care for abnormal bleeding.   She also requested [employer] contact info for STD/FMLA purposes. Provided this and company policy number.   She denies further questions, concerns, needs. Advised her RN will likely contact her again next week to check in.

## 2020-01-07 NOTE — Telephone Encounter (Signed)
Transition Care Management Follow-up Telephone Call  Date of discharge and from where: 01/04/20-[place]  How have you been since you were released from the hospital? Feeling better  Any questions or concerns? No  Items Reviewed:  Did the pt receive and understand the discharge instructions provided? Yes   Medications obtained and verified? Yes   Other? Yes   Any new allergies since your discharge? No   Dietary orders reviewed? Yes  Do you have support at home? Yes   Home Care and Equipment/Supplies: Were home health services ordered? no If so, what is the name of the agency? n/a  Has the agency set up a time to come to the patient's home? not applicable Were any new equipment or medical supplies ordered?  No What is the name of the medical supply agency? n/a Were you able to get the supplies/equipment? not applicable Do you have any questions related to the use of the equipment or supplies? No  Functional Questionnaire: (I = Independent and D = Dependent) ADLs: I  Bathing/Dressing- I  Meal Prep- I  Eating- I  Maintaining continence- I  Transferring/Ambulation- I  Managing Meds- I  Follow up appointments reviewed:   PCP Hospital f/u appt confirmed? Yes  Scheduled to see Dr. Birdie Riddle on 01/12/2020 @ 11:00.  [facility] f/u appt confirmed? Yes  Scheduled to see Dr. Irene Limbo on 01/26/2020 @ 11:00.  Are transportation arrangements needed? No   If their condition worsens, is the pt aware to call PCP or go to the Emergency Dept.? Yes  Was the patient provided with contact information for the PCP's office or ED? Yes  Was to pt encouraged to call back with questions or concerns? Yes

## 2020-01-11 NOTE — Telephone Encounter (Signed)
Spoke with pt over phone. She reports feeling well, "just trying to get my strength back." She reports pcp appt tomorrow 11/15. Sts she called [employer] and completed the paperwork for requesting FMLA and STD. Also endorses appt with hematology 11/30 and GI 02/29/20. 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 Encouraged pt to discuss her work duties, weight/pushing/pulling/lifting/walking/etc all with pcp tomorrow and work together to determine a return date and/or restrictions that work best for her health. She sts she will. She questions if she has to have a Covid test prior to RTW. Advised she does not as long as she is not symptomatic. She also had questions regarding short term vs long term disability. Advised pt of basics, short term starts 14 days after initial disability date, lasts 180 days, 60% of pay. Long term doesn't start until Day 181. She verbalizes understanding, denies further questions/concerns/needs.

## 2020-01-11 NOTE — Telephone Encounter (Signed)
Noted  

## 2020-01-11 NOTE — Telephone Encounter (Signed)
Please check in with patient and remind her of PCM visit scheduled tomorrow

## 2020-01-12 ENCOUNTER — Encounter: Payer: Self-pay | Admitting: Family Medicine

## 2020-01-12 ENCOUNTER — Ambulatory Visit: Payer: PRIVATE HEALTH INSURANCE | Admitting: Family Medicine

## 2020-01-12 ENCOUNTER — Other Ambulatory Visit: Payer: Self-pay

## 2020-01-12 VITALS — BP 121/70 | HR 71 | Temp 97.2°F | Resp 18 | Ht 60.0 in | Wt 193.0 lb

## 2020-01-12 DIAGNOSIS — K529 Noninfective gastroenteritis and colitis, unspecified: Secondary | ICD-10-CM | POA: Diagnosis not present

## 2020-01-12 DIAGNOSIS — I2693 Single subsegmental pulmonary embolism without acute cor pulmonale: Secondary | ICD-10-CM | POA: Diagnosis not present

## 2020-01-12 DIAGNOSIS — K625 Hemorrhage of anus and rectum: Secondary | ICD-10-CM | POA: Diagnosis not present

## 2020-01-12 NOTE — Patient Instructions (Addendum)
Follow up as needed or as scheduled NO BLOOD WORK TODAY!!!! Continue the Eliquis twice daily Take the Bentyl only if needed Continue to drink plenty of water Make sure you are eating regularly (although I understand being afraid) Call with any questions or concerns Hang in there!!! Happy Holidays!

## 2020-01-12 NOTE — Progress Notes (Signed)
   Subjective:    Patient ID: Jamie Kelley, female    DOB: 1955-11-21, 64 y.o.   MRN: [id number]  [facility] f/u- pt was admitted 10/30-11/8 w/ abdominal pain and bloody diarrhea.  It was determined that she had acute colitis caused by Enteropathogenic E Coli- which caused sepsis.  She completed a 10 day course of abx while hospitalized.  During her hospitalization she developed acute respiratory failure w/ hypoxia and was found to have a RLL PE on 11/6.  Treated w/ Heparin and transitioned to Eliquis for d/c.  Has hematology f/u for hypercoagulable w/u.  Today pt reports she is not having abd pain, diarrhea, bloody stools.  Stools are still loose but frequency is normal.  Pt denies SOB today.  Is down 12 lbs since last visit.  Pt reports she is able to eat but is scared to eat.  Reviewed H&P, progress notes, consult notes, D/C summary, labs and imaging.   Review of Systems For ROS see HPI   This visit occurred during the SARS-CoV-2 public health emergency.  Safety protocols were in place, including screening questions prior to the visit, additional usage of staff PPE, and extensive cleaning of exam room while observing appropriate contact time as indicated for disinfecting solutions.       Objective:   Physical Exam Vitals reviewed.  Constitutional:      General: She is not in acute distress.    Appearance: Normal appearance. She is well-developed. She is not ill-appearing.  HENT:     Head: Normocephalic and atraumatic.  Eyes:     Conjunctiva/sclera: Conjunctivae normal.     Pupils: Pupils are equal, round, and reactive to light.  Neck:     Thyroid: No thyromegaly.  Cardiovascular:     Rate and Rhythm: Normal rate and regular rhythm.     Heart sounds: Normal heart sounds. No murmur heard.   Pulmonary:     Effort: Pulmonary effort is normal. No respiratory distress.     Breath sounds: Normal breath sounds.  Abdominal:     General: There is no distension.     Palpations: Abdomen  is soft.     Tenderness: There is no abdominal tenderness.  Musculoskeletal:     Cervical back: Normal range of motion and neck supple.     Right lower leg: No edema.     Left lower leg: No edema.  Lymphadenopathy:     Cervical: No cervical adenopathy.  Skin:    General: Skin is warm and dry.  Neurological:     Mental Status: She is alert and oriented to person, place, and time.  Psychiatric:        Behavior: Behavior normal.           Assessment & Plan:

## 2020-01-12 NOTE — Telephone Encounter (Signed)
OV made with Dr. Lyndel Safe for 02-29-20 at 3:20 pm

## 2020-01-14 NOTE — Telephone Encounter (Signed)
Patient kept PCM appt but encounter note incomplete in Epic.

## 2020-01-19 NOTE — Telephone Encounter (Signed)
Patient stopped by clinic today to ask if she would be safe to receive her covid vaccine booster dose this weekend.  Patient has hematology/oncology appt next week.  Discussed with patient should not be a problem to get her booster at this time because bloody stools/pain/illness has ceased but I recommended she discuss with her specialist at appt next week.  Discussed she can get pfizer again or moderna booster.  Small studies have shown improved antibody production with mixing and getting moderna after pfizer initial series but the important thing is to get her booster soon since greater than 6 months have passed since her initial series completed.  Patient A&Ox3, respiration even and unlabored, no cough or nasal congestion observed, skin warm dry and pink and gait sure and steady in hallway.  Patient verbalized understanding information/instructions, agreed with plan of care and had no further questions at this time.

## 2020-01-20 ENCOUNTER — Telehealth: Payer: Self-pay

## 2020-01-20 NOTE — Telephone Encounter (Signed)
Placed in PCP's bin for completion.

## 2020-01-20 NOTE — Telephone Encounter (Signed)
Leave paperwork placed up front in Dr. Virgil Benedict bin

## 2020-01-20 NOTE — Telephone Encounter (Signed)
Called patient and informed her we have not received paperwork. Patient states she will have them refax it.

## 2020-01-20 NOTE — Telephone Encounter (Signed)
Patient is calling in saying her short term disability and FMLA paperwork were faxed yesterday, and wanted to know if we have received anything.

## 2020-01-25 ENCOUNTER — Telehealth: Payer: Self-pay | Admitting: Family Medicine

## 2020-01-25 NOTE — Assessment & Plan Note (Signed)
Resolved.  She had acute Enteropathogenic E Coli colitis that resulted in sepsis.  She completed a 10 day course of abx while inpt.  She has GI appt upcoming.  She reports abdominal pain has greatly improved and no longer having BRBPR

## 2020-01-25 NOTE — Assessment & Plan Note (Signed)
New.  During hospitalization she developed acute respiratory failure and was found to have a RLL PE on 11/6.  She was initially treated w/ Heparin but then transitioned to Eliquis.  She has appt upcoming w/ hematology and she understands the importance of this.

## 2020-01-25 NOTE — Telephone Encounter (Signed)
Form placed in Tabori's bin for review

## 2020-01-25 NOTE — Telephone Encounter (Signed)
Form completed and placed in basket  

## 2020-01-25 NOTE — Telephone Encounter (Signed)
Form placed in Dr. Virgil Benedict bin up front

## 2020-01-25 NOTE — Assessment & Plan Note (Signed)
Resolved.  Caused by her E Coli colitis which has since been treated.  Has appt upcoming w/ GI.

## 2020-01-26 ENCOUNTER — Inpatient Hospital Stay: Payer: PRIVATE HEALTH INSURANCE | Attending: Hematology | Admitting: Hematology

## 2020-01-26 ENCOUNTER — Other Ambulatory Visit: Payer: Self-pay

## 2020-01-26 VITALS — BP 156/78 | HR 73 | Temp 97.6°F | Resp 18 | Ht 60.0 in | Wt 198.5 lb

## 2020-01-26 DIAGNOSIS — I2693 Single subsegmental pulmonary embolism without acute cor pulmonale: Secondary | ICD-10-CM

## 2020-01-26 MED ORDER — APIXABAN 5 MG PO TABS: 5.0000 mg | ORAL_TABLET | Freq: Two times a day (BID) | ORAL | 4 refills | Status: DC

## 2020-01-26 NOTE — Patient Instructions (Signed)
Thank you for choosing [facility] to provide your oncology and hematology care.   Should you have questions after your visit to the [facility] ([facility]), please contact this office at [phone] between 8:30 AM and 4:30 PM.  Voice mails left after 4:00 PM may not be returned until the following business day.  Calls received after 4:30 PM will be answered by an off-site Nurse Triage Line.    Prescription Refills:  Please have your pharmacy contact us directly for most prescription requests.  Contact the office directly for refills of narcotics (pain medications). Allow 48-72 hours for refills.  Appointments: Please contact the [facility] scheduling department [phone] for questions regarding [facility] appointment scheduling.  Contact the schedulers with any scheduling changes so that your appointment can be rescheduled in a timely manner.   Central Scheduling for [facility] [phone] - Call to schedule procedures such as PET scans, CT scans, MRI, Ultrasound, etc.  To afford each patient quality time with our providers, please arrive 30 minutes before your scheduled appointment time.  If you arrive late for your appointment, you may be asked to reschedule.  We strive to give you quality time with our providers, and arriving late affects you and other patients whose appointments are after yours. If you are a no show for multiple scheduled visits, you may be dismissed from the clinic at the providers discretion.     Resources: [facility] Social Workers [phone] for additional information on assistance programs or assistance connecting with community support programs   [place] DSS  [phone]: Information regarding food stamps, Medicaid, and utility assistance [place] [employer] [place] 336-333-6589   [place] [employer]'s shared-ride transportation service for eligible riders who have a disability that prevents them from riding the fixed route bus.   Medicare  Rights Center 800-333-4114 Helps people with Medicare understand their rights and benefits, navigate the Medicare system, and secure the quality healthcare they deserve [employer] 800-227-2345 Assists patients locate various types of support and financial assistance Cancer Care: 1-800-813-HOPE (4673) Provides financial assistance, online support groups, medication/co-pay assistance.   Transportation Assistance for appointments at [facility]: Transportation Coordinator [phone]  Again, thank you for choosing [facility] for your care.       

## 2020-01-26 NOTE — Progress Notes (Signed)
HEMATOLOGY/ONCOLOGY CONSULTATION NOTE  Date of Service: 01/26/2020  Patient Care Team: Midge Minium, MD as PCP - General (Family Medicine)  CHIEF COMPLAINTS/PURPOSE OF CONSULTATION:  Acute PE  HISTORY OF PRESENTING ILLNESS:   Jamie Kelley is a wonderful 64 y.o. female who has been referred to [place] by Dr. Raiford Noble for evaluation and management of acute Pulmonary Embolism. The pt reports that she is doing well overall.   The pt reports that she had bloody diarrhea for which she was admitted to the hospital on 12/26/19. Pt was confirmed to have an E. Coli infection and was given antibiotics. Pt has since completed that course of antibiotics. She denies chest pain or shortness of breath at the time of her admission. During her hospitalization her oxygen levels became low, which prompted the 01/01/20 CT Chest. She was started on anticoagulation after her PE was discovered. There was concern that she had Pneumonia as well, but her PCP did not think that was the case. Pt has no history of asthma, COPD, or emphysema. Pt denies any previous blood clots or family history of blood clots. Pt has no history of varicose veins or chronic leg swelling. She did not complete any long distance travel or use any hormones or steroids prior to the start of her symptoms. Pt was not on a daily Aspirin prior to her hospitalization. She believes that her last menstrual period occurred at least 5 years ago. Pt is up to date with her Mammograms, but not pelvic exams or Colonoscopies. Pt has received her COVID19 vaccines but has not gotten the booster.  Of note prior to the patient's visit today, pt has had CT Angio Chest PE ([id number]) completed on 01/01/2020 with results revealing "1. Partially occlusive posterior right lower lobe subsegmental pulmonary embolism. No evidence of right ventricular heart strain. 2. Trace right pleural effusion with adjacent basilar atelectasis. 3. Unchanged bilateral thyroid  nodules."   Most recent lab results (01/04/2020) of CBC w/diff and CMP is as follows: all values are WNL except for RBC at 3.79, Hgb at 11.5, HCT at 35.3, Abs Immature Granulocytes at 0.10K, Sodium at 133, Calcium at 8.4, Albumin at 3.1.  On review of systems, pt reports loose stools and denies abdominal pain/cramping, diarrhea, leg swelling, fevers, chills, night sweats, unexpected weight loss, chest pain, SOB, bloody stools and any other symptoms.   On PMHx the pt reports HTN, HLD, Thyroid Nodule Biopsy, Hypothyroidism, Osteoarthritis. On Family Hx the pt reports no bleeding or clotting disorders. She has an aunt who had cancer.   MEDICAL HISTORY:  Past Medical History:  Diagnosis Date  . Arthritis   . Hay fever   . High blood pressure   . Hyperlipidemia   . Hypothyroidism   . Obesity   . Thyroid disease     SURGICAL HISTORY: Past Surgical History:  Procedure Laterality Date  . BIOPSY THYROID     Dr Constance Holster- negative for cancer    SOCIAL HISTORY: Social History   Socioeconomic History  . Marital status: Single    Spouse name: Not on file  . Number of children: Not on file  . Years of education: Not on file  . Highest education level: Not on file  Occupational History    Employer: [employer]  Tobacco Use  . Smoking status: Never Smoker  . Smokeless tobacco: Never Used  Vaping Use  . Vaping Use: Never used  Substance and Sexual Activity  . Alcohol use: No  . Drug  use: No  . Sexual activity: Not Currently  Other Topics Concern  . Not on file  Social History Narrative  . Not on file   Social Determinants of Health   Financial Resource Strain:   . Difficulty of Paying Living Expenses: Not on file  Food Insecurity:   . Worried About Charity fundraiser in the Last Year: Not on file  . Ran Out of Food in the Last Year: Not on file  Transportation Needs:   . Lack of Transportation (Medical): Not on file  . Lack of Transportation (Non-Medical): Not on file    Physical Activity:   . Days of Exercise per Week: Not on file  . Minutes of Exercise per Session: Not on file  Stress:   . Feeling of Stress : Not on file  Social Connections:   . Frequency of Communication with Friends and Family: Not on file  . Frequency of Social Gatherings with Friends and Family: Not on file  . Attends Religious Services: Not on file  . Active Member of Clubs or Organizations: Not on file  . Attends Archivist Meetings: Not on file  . Marital Status: Not on file  Intimate Partner Violence:   . Fear of Current or Ex-Partner: Not on file  . Emotionally Abused: Not on file  . Physically Abused: Not on file  . Sexually Abused: Not on file    FAMILY HISTORY: Family History  Problem Relation Age of Onset  . Heart disease Mother   . Hypertension Mother   . Diabetes Mother   . Alzheimer's disease Mother   . Cancer Maternal Aunt        breast  . Heart disease Maternal Aunt   . Hypertension Maternal Aunt   . Breast cancer Maternal Aunt 70  . Hypertension Maternal Uncle   . Heart disease Maternal Uncle   . Heart disease Maternal Grandmother   . Hypertension Maternal Grandmother   . Diabetes Maternal Grandmother     ALLERGIES:  has No Known Allergies.  MEDICATIONS:  Current Outpatient Medications  Medication Sig Dispense Refill  . albuterol (VENTOLIN HFA) 108 (90 Base) MCG/ACT inhaler Inhale 1-2 puffs into the lungs every 4 (four) hours as needed for wheezing or shortness of breath. 6.7 g 1  . amLODipine (NORVASC) 10 MG tablet Take 1 tablet (10 mg total) by mouth daily. 90 tablet 1  . apixaban (ELIQUIS) 5 MG TABS tablet Take 1 tablet (5 mg total) by mouth 2 (two) times daily. 60 tablet 4  . atenolol (TENORMIN) 50 MG tablet Take 2 tablets (100 mg total) by mouth daily. 180 tablet 1  . dicyclomine (BENTYL) 10 MG capsule Take 1 capsule (10 mg total) by mouth 3 (three) times daily before meals. (Patient not taking: Reported on 01/26/2020) 30 capsule 0   . fluticasone (FLONASE) 50 MCG/ACT nasal spray Place 1 spray into both nostrils 2 (two) times daily. 16 g 0  . hydrochlorothiazide (HYDRODIURIL) 25 MG tablet Take 1 tablet (25 mg total) by mouth daily. 90 tablet 1  . levocetirizine (XYZAL) 5 MG tablet Take 1 tablet by mouth every evening. (Patient not taking: Reported on 12/26/2019)    . levothyroxine (SYNTHROID) 50 MCG tablet TAKE 1/2 (ONE-HALF) TABLET BY MOUTH ONCE DAILY BEFORE BREAKFAST 45 tablet 1  . ondansetron (ZOFRAN) 4 MG tablet Take 1 tablet (4 mg total) by mouth every 6 (six) hours as needed for nausea. (Patient not taking: Reported on 01/12/2020) 20 tablet 0  .  simvastatin (ZOCOR) 20 MG tablet Take 1 tablet (20 mg total) by mouth at bedtime. 90 tablet 1  . sodium chloride (OCEAN) 0.65 % SOLN nasal spray Place 2 sprays into both nostrils every 2 (two) hours while awake for 30 days.  0   No current facility-administered medications for this visit.    REVIEW OF SYSTEMS:    10 Point review of Systems was done is negative except as noted above.  PHYSICAL EXAMINATION: ECOG PERFORMANCE STATUS: 0 - Asymptomatic  . Vitals:   01/26/20 1056  BP: (!) 156/78  Pulse: 73  Resp: 18  Temp: 97.6 F (36.4 C)  SpO2: 99%   Filed Weights   01/26/20 1056  Weight: 198 lb 8 oz (90 kg)   .Body mass index is 38.77 kg/m.  GENERAL:alert, in no acute distress and comfortable SKIN: no acute rashes, no significant lesions EYES: conjunctiva are pink and non-injected, sclera anicteric OROPHARYNX: MMM, no exudates, no oropharyngeal erythema or ulceration NECK: supple, no JVD LYMPH:  no palpable lymphadenopathy in the cervical, axillary or inguinal regions LUNGS: clear to auscultation b/l with normal respiratory effort HEART: regular rate & rhythm ABDOMEN:  normoactive bowel sounds , non tender, not distended. Extremity: no pedal edema PSYCH: alert & oriented x 3 with fluent speech NEURO: no focal motor/sensory deficits  LABORATORY DATA:  I  have reviewed the data as listed  . CBC Latest Ref Rng & Units 01/04/2020 01/03/2020 01/02/2020  WBC 4.0 - 10.5 K/uL 8.4 9.2 10.0  Hemoglobin 12.0 - 15.0 g/dL 11.5(L) 11.6(L) 11.9(L)  Hematocrit 36 - 46 % 35.3(L) 34.8(L) 35.7(L)  Platelets 150 - 400 K/uL 227 247 223    . CMP Latest Ref Rng & Units 01/04/2020 01/03/2020 01/02/2020  Glucose 70 - 99 mg/dL 94 114(H) 116(H)  BUN 8 - 23 mg/dL 9 10 8   Creatinine 0.44 - 1.00 mg/dL 0.46 0.55 0.67  Sodium 135 - 145 mmol/L 133(L) 134(L) 133(L)  Potassium 3.5 - 5.1 mmol/L 4.2 4.3 3.9  Chloride 98 - 111 mmol/L 98 96(L) 93(L)  CO2 22 - 32 mmol/L 29 27 29   Calcium 8.9 - 10.3 mg/dL 8.4(L) 8.5(L) 8.7(L)  Total Protein 6.5 - 8.1 g/dL 6.5 6.7 6.8  Total Bilirubin 0.3 - 1.2 mg/dL 0.6 0.7 0.8  Alkaline Phos 38 - 126 U/L 55 57 49  AST 15 - 41 U/L 41 41 29  ALT 0 - 44 U/L 39 33 23   01/03/2020 [place] Lower Extremity Venous ([id number]):   RADIOGRAPHIC STUDIES: I have personally reviewed the radiological images as listed and agreed with the findings in the report. CT ANGIO CHEST PE W OR WO CONTRAST  Result Date: 01/01/2020 CLINICAL DATA:  Chest pain and shortness of breath EXAM: CT ANGIOGRAPHY CHEST WITH CONTRAST TECHNIQUE: Multidetector CT imaging of the chest was performed using the standard protocol during bolus administration of intravenous contrast. Multiplanar CT image reconstructions and MIPs were obtained to evaluate the vascular anatomy. CONTRAST:  172mL OMNIPAQUE IOHEXOL 350 MG/ML SOLN COMPARISON:  None. FINDINGS: Cardiovascular: There is a optimal opacification of the pulmonary arteries. There is no central or segmental filling defect. There appears to be a partially occlusive filling defect in the posterior right lower lobe subsegmental arterial branch. There is mild cardiomegaly. No pericardial effusion or thickening. No evidence right heart strain. There is normal three-vessel brachiocephalic anatomy without proximal stenosis. The thoracic aorta is  normal in appearance. Mediastinum/Nodes: No hilar, mediastinal, or axillary adenopathy. Again noted is heterogeneously enlarged bilateral thyroid gland with  goiter extending into the upper mediastinum. This was visualized on recent ultrasound of December 18, 2019 and is unchanged. Lungs/Pleura: There is a trace right pleural effusion with adjacent patchy airspace opacities at both lung bases. Upper Abdomen: No acute abnormalities present in the visualized portions of the upper abdomen. Musculoskeletal: No chest wall abnormality. No acute or significant osseous findings. Review of the MIP images confirms the above findings. IMPRESSION: 1. Partially occlusive posterior right lower lobe subsegmental pulmonary embolism. No evidence of right ventricular heart strain. 2. Trace right pleural effusion with adjacent basilar atelectasis. 3. Unchanged bilateral thyroid nodules. These results will be called to the ordering clinician or representative by the Radiologist Assistant, and communication documented in the PACS or [employer]. Electronically Signed   By: Prudencio Pair M.D.   On: 01/01/2020 22:41   DG CHEST PORT 1 VIEW  Result Date: 01/02/2020 CLINICAL DATA:  Shortness of breath.  Pulmonary embolus. EXAM: PORTABLE CHEST 1 VIEW COMPARISON:  01/01/2020 FINDINGS: Normal heart size. Decreased lung volumes with mild asymmetric elevation of right hemidiaphragm. Bibasilar atelectasis again noted. No airspace consolidation. No pleural effusion or edema identified. IMPRESSION: Decreased lung volumes with bibasilar atelectasis Electronically Signed   By: Kerby Moors M.D.   On: 01/02/2020 06:31   DG CHEST PORT 1 VIEW  Result Date: 01/01/2020 CLINICAL DATA:  Shortness of breath. EXAM: PORTABLE CHEST 1 VIEW COMPARISON:  12/31/2019. FINDINGS: Cardiomegaly. No pulmonary venous congestion. Low lung volumes with bibasilar atelectasis. Mild right perihilar and right base infiltrates cannot be excluded. No pleural effusion  or pneumothorax. IMPRESSION: 1. Cardiomegaly. No pulmonary venous congestion. 2. Low lung volumes with bibasilar atelectasis. Mild right perihilar and right base infiltrates cannot be excluded on today's exam. Electronically Signed   By: Marcello Moores  Register   On: 01/01/2020 06:51   DG CHEST PORT 1 VIEW  Result Date: 12/31/2019 CLINICAL DATA:  Fever. EXAM: PORTABLE CHEST 1 VIEW COMPARISON:  December 30, 2019. FINDINGS: Stable cardiomegaly. No pneumothorax or pleural effusion is noted. Left lung is clear. Mild right basilar subsegmental atelectasis is noted. Bony thorax is unremarkable. IMPRESSION: Mild right basilar subsegmental atelectasis. Electronically Signed   By: Marijo Conception M.D.   On: 12/31/2019 08:26   DG CHEST PORT 1 VIEW  Result Date: 12/30/2019 CLINICAL DATA:  64 year old with history of hypertension question of colitis on recent CT EXAM: PORTABLE CHEST 1 VIEW COMPARISON:  11/24/2008 FINDINGS: Low normal lung volumes. Mild cardiac enlargement accentuated by low lung volumes. Mild central pulmonary vascular engorgement. Mild elevation of the RIGHT hemidiaphragm relative to the LEFT is similar to what is seen on the recent CT evaluation. Patchy mainly linear airspace opacities at the RIGHT lung base. No lobar consolidative changes. No sign of pleural effusion. On limited assessment no acute skeletal process. IMPRESSION: Patchy mainly linear airspace opacities at the RIGHT lung base, may represent atelectasis or early infection. Mild cardiomegaly and central pulmonary vascular engorgement. Electronically Signed   By: Zetta Bills M.D.   On: 12/30/2019 10:40   ECHOCARDIOGRAM COMPLETE  Result Date: 01/02/2020    ECHOCARDIOGRAM REPORT   Patient Name:   DAMYIA Petitti Date of Exam: 01/02/2020 Medical Rec #:  [id number]  Height:       60.0 in Accession #:    [id number] Weight:       202.2 lb Date of Birth:  [birth year]  BSA:          1.875 m Patient Age:    78 years   BP:  143/84 mmHg Patient  Gender: F          HR:           84 bpm. Exam Location:  Inpatient Procedure: 2D Echo and Strain Analysis Indications:    Pulmonary Embolus 415.19 / I26.99  History:        Patient has no prior history of Echocardiogram examinations.                 Risk Factors:Hypertension and Dyslipidemia. Acute colitis due to                 enteropathogenic E. Coli with severe sepsis. Acute Respiratory                 Failure with Hypoxia                 possible pneumonia. Normocytic Anemia.  Sonographer:    Darlina Sicilian RDCS Referring Phys: [id number] Georgina Quint LATIF [place]  1. Left ventricular ejection fraction, by estimation, is 65 to 70%. The left ventricle has normal function. The left ventricle has no regional wall motion abnormalities. There is mild left ventricular hypertrophy. Left ventricular diastolic parameters are indeterminate. Global longitudinal strain calculation does not appear accurate (endocardial borders not tracked adequately) and is not reported.  2. Right ventricular systolic function is normal. The right ventricular size is normal. There is normal pulmonary artery systolic pressure. The estimated right ventricular systolic pressure is 51.8 mmHg.  3. The mitral valve is grossly normal. Trivial mitral valve regurgitation.  4. The aortic valve is tricuspid. Aortic valve regurgitation is not visualized.  5. The inferior vena cava is normal in size with greater than 50% respiratory variability, suggesting right atrial pressure of 3 mmHg. FINDINGS  Left Ventricle: Left ventricular ejection fraction, by estimation, is 65 to 70%. The left ventricle has normal function. The left ventricle has no regional wall motion abnormalities. The left ventricular internal cavity size was normal in size. There is  mild left ventricular hypertrophy. Left ventricular diastolic parameters are indeterminate. Right Ventricle: The right ventricular size is normal. No increase in right ventricular wall thickness. Right  ventricular systolic function is normal. There is normal pulmonary artery systolic pressure. The tricuspid regurgitant velocity is 2.57 m/s, and  with an assumed right atrial pressure of 3 mmHg, the estimated right ventricular systolic pressure is 84.1 mmHg. Left Atrium: Left atrial size was normal in size. Right Atrium: Right atrial size was normal in size. Pericardium: There is no evidence of pericardial effusion. Mitral Valve: The mitral valve is grossly normal. Trivial mitral valve regurgitation. Tricuspid Valve: The tricuspid valve is grossly normal. Tricuspid valve regurgitation is mild. Aortic Valve: The aortic valve is tricuspid. Aortic valve regurgitation is not visualized. Pulmonic Valve: The pulmonic valve was grossly normal. Pulmonic valve regurgitation is trivial. Aorta: The aortic root is normal in size and structure. Venous: The inferior vena cava is normal in size with greater than 50% respiratory variability, suggesting right atrial pressure of 3 mmHg. IAS/Shunts: No atrial level shunt detected by color flow Doppler.  LEFT VENTRICLE PLAX 2D LVIDd:         3.50 cm  Diastology LVIDs:         2.20 cm  LV e' medial:    5.66 cm/s LV PW:         1.00 cm  LV E/e' medial:  9.6 LV IVS:        1.20 cm  LV e' lateral:  5.33 cm/s LVOT diam:     1.80 cm  LV E/e' lateral: 10.2 LV SV:         46 LV SV Index:   24 LVOT Area:     2.54 cm  LEFT ATRIUM             Index       RIGHT ATRIUM          Index LA diam:        3.10 cm 1.65 cm/m  RA Area:     9.50 cm LA Vol (A2C):   25.1 ml 13.39 ml/m RA Volume:   18.50 ml 9.87 ml/m LA Vol (A4C):   31.0 ml 16.53 ml/m LA Biplane Vol: 28.1 ml 14.99 ml/m  AORTIC VALVE LVOT Vmax:   120.00 cm/s LVOT Vmean:  83.900 cm/s LVOT VTI:    0.179 m  AORTA Ao Root diam: 3.10 cm Ao Asc diam:  3.00 cm MITRAL VALVE               TRICUSPID VALVE MV Area (PHT): 5.38 cm    TR Peak grad:   26.4 mmHg MV Decel Time: 141 msec    TR Vmax:        257.00 cm/s MV E velocity: 54.50 cm/s MV A  velocity: 77.80 cm/s  SHUNTS MV E/A ratio:  0.70        Systemic VTI:  0.18 m                            Systemic Diam: 1.80 cm Rozann Lesches MD Electronically signed by Rozann Lesches MD Signature Date/Time: 01/02/2020/1:01:47 PM    Final    VAS [place] LOWER EXTREMITY VENOUS (DVT)  Result Date: 01/03/2020  Lower Venous DVT Study Indications: Pulmonary embolism.  Comparison Study: No prior study Performing Technologist: Maudry Mayhew MHA, RDMS, RVT, RDCS  Examination Guidelines: A complete evaluation includes B-mode imaging, spectral Doppler, color Doppler, and power Doppler as needed of all accessible portions of each vessel. Bilateral testing is considered an integral part of a complete examination. Limited examinations for reoccurring indications may be performed as noted. The reflux portion of the exam is performed with the patient in reverse Trendelenburg.  +---------+---------------+---------+-----------+----------+--------------+ RIGHT    CompressibilityPhasicitySpontaneityPropertiesThrombus Aging +---------+---------------+---------+-----------+----------+--------------+ CFV      Full           Yes      Yes                                 +---------+---------------+---------+-----------+----------+--------------+ SFJ      Full                                                        +---------+---------------+---------+-----------+----------+--------------+ FV Prox  Full                                                        +---------+---------------+---------+-----------+----------+--------------+ FV Mid   Full                                                        +---------+---------------+---------+-----------+----------+--------------+  FV DistalFull                                                        +---------+---------------+---------+-----------+----------+--------------+ PFV      Full                                                         +---------+---------------+---------+-----------+----------+--------------+ POP      Full           Yes      Yes                                 +---------+---------------+---------+-----------+----------+--------------+ PTV      Full                                                        +---------+---------------+---------+-----------+----------+--------------+ PERO     Full                                                        +---------+---------------+---------+-----------+----------+--------------+   +---------+---------------+---------+-----------+----------+--------------+ LEFT     CompressibilityPhasicitySpontaneityPropertiesThrombus Aging +---------+---------------+---------+-----------+----------+--------------+ CFV      Full           Yes      Yes                                 +---------+---------------+---------+-----------+----------+--------------+ SFJ      Full                                                        +---------+---------------+---------+-----------+----------+--------------+ FV Prox  Full                                                        +---------+---------------+---------+-----------+----------+--------------+ FV Mid   Full                                                        +---------+---------------+---------+-----------+----------+--------------+ FV DistalFull                                                        +---------+---------------+---------+-----------+----------+--------------+  PFV      Full                                                        +---------+---------------+---------+-----------+----------+--------------+ POP      Full           Yes      Yes                                 +---------+---------------+---------+-----------+----------+--------------+ PTV      Full                                                         +---------+---------------+---------+-----------+----------+--------------+ PERO     Full                                                        +---------+---------------+---------+-----------+----------+--------------+     Summary: RIGHT: - There is no evidence of deep vein thrombosis in the lower extremity.  - No cystic structure found in the popliteal fossa.  LEFT: - There is no evidence of deep vein thrombosis in the lower extremity.  - No cystic structure found in the popliteal fossa.  *See table(s) above for measurements and observations. Electronically signed by Servando Snare MD on 01/03/2020 at 8:36:44 PM.    Final     ASSESSMENT & PLAN:   64 yo with   1) Acute Pulmonary embolism PLAN: -Discussed patient's most recent labs from 01/04/2020, all values are WNL except for RBC at 3.79, Hgb at 11.5, HCT at 35.3, Abs Immature Granulocytes at 0.10K, Sodium at 133, Calcium at 8.4, Albumin at 3.1. -Discussed 01/01/2020 CT Angio Chest PE ([id number]) which revealed "1. Partially occlusive posterior right lower lobe subsegmental pulmonary embolism. No evidence of right ventricular heart strain. 2. Trace right pleural effusion with adjacent basilar atelectasis. 3. Unchanged bilateral thyroid nodules."  -Advised pt that her pulmonary embolism was not extensive, which does not carry the highest risk of recurrence. -Advised pt that infection, immobilization, dehydration, and age are all risk factors for blood clots.  -Advised pt that lack of FHx or previous blood clots do not suggest a hereditary blood clotting disorder.  -Advised pt that we could complete additional labwork to r/o a genetic blood clotting disorder, but this is not explicitly recommended.  -Pt is not keen to complete additional labwork at this time. She will discuss additional testing with her PCP & complete if desired.  -Recommend pt stay well hydrated, stay at optimal body weight, ambulate every hour when traveling, and stay as  active as possible. -Discussed CDC guidelines regarding the [place] booster. Recommend pt receive the Pfizer booster.  -Recommend pt use OTC probiotics or live-cultured yogurt for loose stools. -Recommend pt f/u with PCP for age-appropriate cancer screening -Recommend pt continue 5 mg Eliquis BID for a total of 6 months - Refill Eliquis -F/u with PCP, RTC with Dr. Irene Limbo as needed.  FOLLOW UP: RTC with Dr Irene Limbo as needed   All of the patients questions were answered with apparent satisfaction. The patient knows to call the clinic with any problems, questions or concerns.  I spent 30 mins counseling the patient face to face. The total time spent in the appointment was 45 minutes and more than 50% was on counseling and direct patient cares.    Sullivan Lone MD [place] AAHIVMS [facility] Hematology/Oncology Physician [facility]  (Office):       [phone] (Work cell):  [phone] (Fax):           [phone]  01/26/2020 4:58 PM  I, Yevette Edwards, am acting as a scribe for Dr. Sullivan Lone.   .I have reviewed the above documentation for accuracy and completeness, and I agree with the above. Brunetta Genera MD

## 2020-01-29 ENCOUNTER — Telehealth: Payer: Self-pay | Admitting: Family Medicine

## 2020-01-29 ENCOUNTER — Encounter: Payer: PRIVATE HEALTH INSURANCE | Admitting: Gastroenterology

## 2020-01-29 NOTE — Telephone Encounter (Signed)
Pt called in asking if we having any coupons for the Eliquis, she states that it expensive.   Please advise

## 2020-01-29 NOTE — Telephone Encounter (Signed)
Pt would like to know if we have any coupons for elequis? Pt states it is too expensive. Please advise!

## 2020-01-29 NOTE — Telephone Encounter (Signed)
Pt should check [employer] and see if they have any coupons available.  If not, she should go to the website and see if they current promotions

## 2020-02-01 ENCOUNTER — Telehealth: Payer: Self-pay

## 2020-02-01 NOTE — Telephone Encounter (Signed)
Left vm about handicap place card. Pt will return call to office with any further questions.

## 2020-02-01 NOTE — Telephone Encounter (Signed)
I can renew the Handicapped placard for her

## 2020-02-01 NOTE — Telephone Encounter (Signed)
Called patient and left vm concerning handicap renewal form. Per Dr. Birdie Riddle will fill out form so she can renew her card.

## 2020-02-01 NOTE — Telephone Encounter (Signed)
Spoke with patient about coupons for elequis. Pt understood. Pt also states that her handicap sticker expires at the end of the month and wanted to know if you could fiil out her forms to renew? Please advise

## 2020-02-09 NOTE — Telephone Encounter (Signed)
Patient is calling in asking for an update on the handicap form, wondering if it is ready for pick up.

## 2020-02-09 NOTE — Telephone Encounter (Signed)
Pt called wanting to know if her handicap sticker is ready for pick up? Please advise

## 2020-02-11 NOTE — Telephone Encounter (Signed)
Left a vm message informing the patient her form was placed up front for pick up.

## 2020-02-11 NOTE — Telephone Encounter (Signed)
Form was placed in bin and pt can pick up at her convenience (she needs to complete her part of the application)

## 2020-02-13 NOTE — Telephone Encounter (Signed)
Noted on oncology/hematology OV recommended patient receive pfizer covid booster.

## 2020-02-29 ENCOUNTER — Encounter: Payer: Self-pay | Admitting: Gastroenterology

## 2020-02-29 ENCOUNTER — Ambulatory Visit: Payer: PRIVATE HEALTH INSURANCE | Admitting: Gastroenterology

## 2020-02-29 VITALS — BP 128/88 | HR 67 | Ht 60.0 in | Wt 196.0 lb

## 2020-02-29 DIAGNOSIS — K529 Noninfective gastroenteritis and colitis, unspecified: Secondary | ICD-10-CM | POA: Diagnosis not present

## 2020-02-29 NOTE — Patient Instructions (Signed)
If you are age 63 or older, your body mass index should be between 23-30. Your Body mass index is 38.28 kg/m. If this is out of the aforementioned range listed, please consider follow up with your Primary Care Provider.  If you are age 36 or younger, your body mass index should be between 19-25. Your Body mass index is 38.28 kg/m. If this is out of the aformentioned range listed, please consider follow up with your Primary Care Provider.    We would like to do a colonoscopy in May 2022. Please call us back in April 2022 to schedule this procedure. [phone]  Due to recent changes in healthcare laws, you may see the results of your imaging and laboratory studies on MyChart before your provider has had a chance to review them.  We understand that in some cases there may be results that are confusing or concerning to you. Not all laboratory results come back in the same time frame and the provider may be waiting for multiple results in order to interpret others.  Please give [place] 48 hours in order for your provider to thoroughly review all the results before contacting the office for clarification of your results.   It was a pleasure to see you today!  Lynann Bologna, M.D.

## 2020-02-29 NOTE — Progress Notes (Signed)
Chief Complaint:   Referring Provider:  Sheliah Hatch, MD      ASSESSMENT AND PLAN;   #1. 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 Patent IMA/SMA on CTA.  C. Diff neg. Here for screening colonoscopy.  #2.  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  She would like to wait for colonoscopy until she is off Eliquis.  Hence, colon in May 2022 (once off eliquis) with miralax.  I have discussed risks and benefits. -If any blood in the stool, then she will promptly get in touch with [place] and we will perform colonoscopy at an earlier date.   HPI:    Jamie Kelley is a 65 y.o. female   For FU  Feels much better.  No diarrhea except when she eats raw fruits/vegetables.  No melena or hematochezia.  She denies having any upper GI symptoms including nausea, vomiting, heartburn, regurgitation, odynophagia or dysphagia.  She knows that she needs colonoscopy.  Unfortunately, she developed PE 12/2019 and is currently on Eliquis.  She has been seen by hematology and told that she would need Eliquis for 6 months.  Patient would like to wait until she is off Eliquis for colonoscopy.  She understands the need for colonoscopy-to detect colorectal cancers.   Past Medical History:  Diagnosis Date  . Arthritis   . Hay fever   . High blood pressure   . Hyperlipidemia   . Hypothyroidism   . Obesity   . Thyroid disease     Past Surgical History:  Procedure Laterality Date  . BIOPSY THYROID     Dr Pollyann Kennedy- negative for cancer  . COLONOSCOPY     20's    Family History  Problem Relation Age of Onset  . Heart disease Mother   . Hypertension Mother   . Diabetes Mother   . Alzheimer's disease Mother   . Cancer Maternal Aunt        breast  . Heart disease Maternal Aunt   . Hypertension Maternal Aunt   . Breast cancer Maternal Aunt 70  . Hypertension Maternal Uncle   . Heart disease Maternal  Uncle   . Heart disease Maternal Grandmother   . Hypertension Maternal Grandmother   . Diabetes Maternal Grandmother   . Colon cancer Neg Hx   . Esophageal cancer Neg Hx   . Colon polyps Neg Hx     Social History   Tobacco Use  . Smoking status: Never Smoker  . Smokeless tobacco: Never Used  Vaping Use  . Vaping Use: Never used  Substance Use Topics  . Alcohol use: No  . Drug use: No    Current Outpatient Medications  Medication Sig Dispense Refill  . albuterol (VENTOLIN HFA) 108 (90 Base) MCG/ACT inhaler Inhale 1-2 puffs into the lungs every 4 (four) hours as needed for wheezing or shortness of breath. 6.7 g 1  . amLODipine (NORVASC) 10 MG tablet Take 1 tablet (10 mg total) by mouth daily. 90 tablet 1  . apixaban (ELIQUIS) 5 MG TABS tablet Take 1 tablet (5 mg total) by mouth 2 (two) times daily. 60 tablet 4  . atenolol (TENORMIN) 50 MG tablet Take 2 tablets (100 mg total) by mouth daily. 180 tablet 1  . hydrochlorothiazide (HYDRODIURIL) 25 MG tablet Take 1 tablet (25 mg total) by mouth daily. 90 tablet 1  . levocetirizine (XYZAL) 5 MG tablet Take 1  tablet by mouth every evening.    Marland Kitchen levothyroxine (SYNTHROID) 50 MCG tablet TAKE 1/2 (ONE-HALF) TABLET BY MOUTH ONCE DAILY BEFORE BREAKFAST 45 tablet 1  . simvastatin (ZOCOR) 20 MG tablet Take 1 tablet (20 mg total) by mouth at bedtime. 90 tablet 1  . fluticasone (FLONASE) 50 MCG/ACT nasal spray Place 1 spray into both nostrils 2 (two) times daily. (Patient taking differently: Place 1 spray into both nostrils as needed.) 16 g 0   No current facility-administered medications for this visit.    No Known Allergies  Review of Systems:  Constitutional: Denies fever, chills, diaphoresis, appetite change and fatigue.  HEENT: Denies photophobia, eye pain, redness, hearing loss, ear pain, congestion, sore throat, rhinorrhea, sneezing, mouth sores, neck pain, neck stiffness and tinnitus.   Respiratory: Denies SOB, DOE, cough, chest  tightness,  and wheezing.   Cardiovascular: Denies chest pain, palpitations and leg swelling.  Genitourinary: Denies dysuria, urgency, frequency, hematuria, flank pain and difficulty urinating.  Musculoskeletal: Denies myalgias, back pain, joint swelling, arthralgias and gait problem.  Skin: No rash.  Neurological: Denies dizziness, seizures, syncope, weakness, light-headedness, numbness and headaches.  Hematological: Denies adenopathy. Easy bruising, personal or family bleeding history  Psychiatric/Behavioral: No anxiety or depression     Physical Exam:    BP 128/88   Pulse 67   Ht 5' (1.524 m)   Wt 196 lb (88.9 kg)   LMP 03/10/2012   BMI 38.28 kg/m  Wt Readings from Last 3 Encounters:  02/29/20 196 lb (88.9 kg)  01/26/20 198 lb 8 oz (90 kg)  01/12/20 193 lb (87.5 kg)   Constitutional:  Well-developed, in no acute distress. Psychiatric: Normal mood and affect. Behavior is normal. HEENT: Pupils normal.  Conjunctivae are normal. No scleral icterus. Neck supple.  Cardiovascular: Normal rate, regular rhythm. No edema Pulmonary/chest: Effort normal and breath sounds normal. No wheezing, rales or rhonchi. Abdominal: Soft, nondistended. Nontender. Bowel sounds active throughout. There are no masses palpable. No hepatomegaly. Rectal: Deferred Neurological: Alert and oriented to person place and time. Skin: Skin is warm and dry. No rashes noted.  Data Reviewed: I have personally reviewed following labs and imaging studies  CBC: CBC Latest Ref Rng & Units 01/04/2020 01/03/2020 01/02/2020  WBC 4.0 - 10.5 K/uL 8.4 9.2 10.0  Hemoglobin 12.0 - 15.0 g/dL 11.5(L) 11.6(L) 11.9(L)  Hematocrit 36.0 - 46.0 % 35.3(L) 34.8(L) 35.7(L)  Platelets 150 - 400 K/uL 227 247 223    CMP: CMP Latest Ref Rng & Units 01/04/2020 01/03/2020 01/02/2020  Glucose 70 - 99 mg/dL 94 114(H) 116(H)  BUN 8 - 23 mg/dL 9 10 8   Creatinine 0.44 - 1.00 mg/dL 0.46 0.55 0.67  Sodium 135 - 145 mmol/L 133(L) 134(L) 133(L)   Potassium 3.5 - 5.1 mmol/L 4.2 4.3 3.9  Chloride 98 - 111 mmol/L 98 96(L) 93(L)  CO2 22 - 32 mmol/L 29 27 29   Calcium 8.9 - 10.3 mg/dL 8.4(L) 8.5(L) 8.7(L)  Total Protein 6.5 - 8.1 g/dL 6.5 6.7 6.8  Total Bilirubin 0.3 - 1.2 mg/dL 0.6 0.7 0.8  Alkaline Phos 38 - 126 U/L 55 57 49  AST 15 - 41 U/L 41 41 29  ALT 0 - 44 U/L 39 33 23      Carmell Austria, MD 02/29/2020, 3:18 PM  Cc: Midge Minium, MD

## 2020-05-10 ENCOUNTER — Encounter: Payer: Self-pay | Admitting: Gastroenterology

## 2020-06-09 ENCOUNTER — Encounter: Payer: Self-pay | Admitting: Family Medicine

## 2020-06-09 ENCOUNTER — Other Ambulatory Visit: Payer: Self-pay

## 2020-06-09 ENCOUNTER — Ambulatory Visit (INDEPENDENT_AMBULATORY_CARE_PROVIDER_SITE_OTHER): Payer: PRIVATE HEALTH INSURANCE | Admitting: Family Medicine

## 2020-06-09 VITALS — BP 118/80 | HR 67 | Temp 97.6°F | Resp 18 | Ht 62.0 in | Wt 197.4 lb

## 2020-06-09 DIAGNOSIS — E559 Vitamin D deficiency, unspecified: Secondary | ICD-10-CM | POA: Diagnosis not present

## 2020-06-09 DIAGNOSIS — F330 Major depressive disorder, recurrent, mild: Secondary | ICD-10-CM

## 2020-06-09 DIAGNOSIS — Z0000 Encounter for general adult medical examination without abnormal findings: Secondary | ICD-10-CM | POA: Diagnosis not present

## 2020-06-09 DIAGNOSIS — Z23 Encounter for immunization: Secondary | ICD-10-CM

## 2020-06-09 DIAGNOSIS — E010 Iodine-deficiency related diffuse (endemic) goiter: Secondary | ICD-10-CM

## 2020-06-09 LAB — LIPID PANEL
Cholesterol: 173 mg/dL (ref 0–200)
HDL: 61.3 mg/dL (ref >39.00)
LDL Cholesterol: 96 mg/dL (ref 0–99)
NonHDL: 111.64
Total CHOL/HDL Ratio: 3
Triglycerides: 79 mg/dL (ref 0.0–149.0)
VLDL: 15.8 mg/dL (ref 0.0–40.0)

## 2020-06-09 LAB — HEPATIC FUNCTION PANEL
ALT: 15 U/L (ref 0–35)
AST: 18 U/L (ref 0–37)
Albumin: 4.2 g/dL (ref 3.5–5.2)
Alkaline Phosphatase: 84 U/L (ref 39–117)
Bilirubin, Direct: 0.1 mg/dL (ref 0.0–0.3)
Total Bilirubin: 0.5 mg/dL (ref 0.2–1.2)
Total Protein: 7.9 g/dL (ref 6.0–8.3)

## 2020-06-09 LAB — CBC WITH DIFFERENTIAL/PLATELET
Basophils Absolute: 0.1 10*3/uL (ref 0.0–0.1)
Basophils Relative: 0.9 % (ref 0.0–3.0)
Eosinophils Absolute: 0.1 10*3/uL (ref 0.0–0.7)
Eosinophils Relative: 1.6 % (ref 0.0–5.0)
HCT: 41.1 % (ref 36.0–46.0)
Hemoglobin: 13.8 g/dL (ref 12.0–15.0)
Lymphocytes Relative: 30.4 % (ref 12.0–46.0)
Lymphs Abs: 1.7 10*3/uL (ref 0.7–4.0)
MCHC: 33.5 g/dL (ref 30.0–36.0)
MCV: 88.6 fl (ref 78.0–100.0)
Monocytes Absolute: 0.5 10*3/uL (ref 0.1–1.0)
Monocytes Relative: 7.9 % (ref 3.0–12.0)
Neutro Abs: 3.4 10*3/uL (ref 1.4–7.7)
Neutrophils Relative %: 59.2 % (ref 43.0–77.0)
Platelets: 195 10*3/uL (ref 150.0–400.0)
RBC: 4.64 Mil/uL (ref 3.87–5.11)
RDW: 14.9 % (ref 11.5–15.5)
WBC: 5.7 10*3/uL (ref 4.0–10.5)

## 2020-06-09 LAB — BASIC METABOLIC PANEL
BUN: 15 mg/dL (ref 6–23)
CO2: 28 mEq/L (ref 19–32)
Calcium: 9.7 mg/dL (ref 8.4–10.5)
Chloride: 102 mEq/L (ref 96–112)
Creatinine, Ser: 0.72 mg/dL (ref 0.40–1.20)
GFR: 89.35 mL/min (ref >60.00)
Glucose, Bld: 101 mg/dL — ABNORMAL HIGH (ref 70–99)
Potassium: 3.8 mEq/L (ref 3.5–5.1)
Sodium: 138 mEq/L (ref 135–145)

## 2020-06-09 LAB — VITAMIN D 25 HYDROXY (VIT D DEFICIENCY, FRACTURES): VITD: 10.12 ng/mL — ABNORMAL LOW (ref 30.00–100.00)

## 2020-06-09 LAB — TSH: TSH: 0.97 u[IU]/mL (ref 0.35–4.50)

## 2020-06-09 MED ORDER — LEVOTHYROXINE SODIUM 50 MCG PO TABS
ORAL_TABLET | ORAL | 1 refills | Status: DC
Start: 2020-06-09 — End: 2020-12-09

## 2020-06-09 MED ORDER — HYDROCHLOROTHIAZIDE 25 MG PO TABS
25.0000 mg | ORAL_TABLET | Freq: Every day | ORAL | 1 refills | Status: DC
Start: 2020-06-09 — End: 2020-12-09

## 2020-06-09 MED ORDER — SIMVASTATIN 20 MG PO TABS: 20.0000 mg | ORAL_TABLET | Freq: Every day | ORAL | 1 refills | Status: DC

## 2020-06-09 MED ORDER — VITAMIN D (ERGOCALCIFEROL) 1.25 MG (50000 UNIT) PO CAPS: 50000.0000 [IU] | ORAL_CAPSULE | ORAL | 0 refills | Status: DC

## 2020-06-09 MED ORDER — ATENOLOL 50 MG PO TABS
100.0000 mg | ORAL_TABLET | Freq: Every day | ORAL | 1 refills | Status: DC
Start: 2020-06-09 — End: 2020-12-09

## 2020-06-09 MED ORDER — AMLODIPINE BESYLATE 10 MG PO TABS
10.0000 mg | ORAL_TABLET | Freq: Every day | ORAL | 1 refills | Status: DC
Start: 2020-06-09 — End: 2020-12-09

## 2020-06-09 NOTE — Assessment & Plan Note (Signed)
Pt's PE WNL w/ exception of obesity and known thyromegaly/nodularity.  UTD on mammo, cologuard, flu, COVID.  Tdap given.  Pt refused pap.  Check labs.  Anticipatory guidance provided.

## 2020-06-09 NOTE — Assessment & Plan Note (Signed)
Pt's BMI is 36.1 and combined w/ her other medical issues, this qualifies as morbidly obese.  Discussed need for healthy diet and regular exercise.  Check labs to risk stratify.  Will follow.

## 2020-06-09 NOTE — Assessment & Plan Note (Signed)
Currently in remission 

## 2020-06-09 NOTE — Assessment & Plan Note (Signed)
Pt had seen [facility] but had to cancel her thyroid biopsy due to her PE.  Encouraged her to reschedule as she is set to finish her anticoagulation next month.  Pt expressed understanding and is in agreement w/ plan.

## 2020-06-09 NOTE — Patient Instructions (Addendum)
Follow up in 6 months to recheck BP and cholesterol We'll notify you of your lab results and make any changes if needed Schedule your colonoscopy with GI Call and schedule your appt with [facility] [phone]) Continue to work on healthy diet and regular exercise- you can do it! Call with any questions or concerns Happy Easter!!!

## 2020-06-09 NOTE — Progress Notes (Signed)
   Subjective:    Patient ID: Jamie Kelley, female    DOB: [birth year], 65 y.o.   MRN: [id number]  HPI CPE- due for pap (pt declines), Tdap.  UTD on flu, COVID, mammo, cologuard  Reviewed past medical, surgical, family and social histories.   Health Maintenance  Topic Date Due  . PAP SMEAR-Modifier  Never done  . TETANUS/TDAP  01/11/2021 (Originally 08/11/1974)  . Hepatitis C Screening  06/09/2021 (Originally [birth year])  . INFLUENZA VACCINE  09/26/2020  . MAMMOGRAM  12/20/2021  . Fecal DNA (Cologuard)  07/06/2022  . COVID-19 Vaccine  Completed  . HIV Screening  Completed  . HPV VACCINES  Aged Out      Review of Systems Patient reports no vision/ hearing changes, adenopathy,fever, weight change,  persistant/recurrent hoarseness , swallowing issues, chest pain, palpitations, edema, persistant/recurrent cough, hemoptysis, dyspnea (rest/exertional/paroxysmal nocturnal), gastrointestinal bleeding (melena, rectal bleeding), abdominal pain, significant heartburn, bowel changes, GU symptoms (dysuria, hematuria, incontinence), Gyn symptoms (abnormal  bleeding, pain),  syncope, focal weakness, memory loss, numbness & tingling, skin/hair/nail changes, abnormal bruising or bleeding, anxiety, or depression.   This visit occurred during the SARS-CoV-2 public health emergency.  Safety protocols were in place, including screening questions prior to the visit, additional usage of staff PPE, and extensive cleaning of exam room while observing appropriate contact time as indicated for disinfecting solutions.       Objective:   Physical Exam General Appearance:    Alert, cooperative, no distress, appears stated age  Head:    Normocephalic, without obvious abnormality, atraumatic  Eyes:    PERRL, conjunctiva/corneas clear, EOM's intact, fundi    benign, both eyes  Ears:    Normal TM's and external ear canals, both ears  Nose:   Deferred due to COVID  Throat:   Neck:   Supple, symmetrical, trachea  midline, no adenopathy;    Thyroid: multiple nodules  Back:     Symmetric, no curvature, ROM normal, no CVA tenderness  Lungs:     Clear to auscultation bilaterally, respirations unlabored  Chest Wall:    No tenderness or deformity   Heart:    Regular rate and rhythm, S1 and S2 normal, no murmur, rub   or gallop  Breast Exam:    Deferred to mammo  Abdomen:     Soft, non-tender, bowel sounds active all four quadrants,    no masses, no organomegaly  Genitalia:    Deferred  Rectal:    Extremities:   Extremities normal, atraumatic, no cyanosis or edema  Pulses:   2+ and symmetric all extremities  Skin:   Skin color, texture, turgor normal, no rashes or lesions  Lymph nodes:   Cervical, supraclavicular, and axillary nodes normal  Neurologic:   CNII-XII intact, normal strength, sensation and reflexes    throughout          Assessment & Plan:

## 2020-06-09 NOTE — Assessment & Plan Note (Signed)
Check labs and replete prn. 

## 2020-08-18 ENCOUNTER — Telehealth: Payer: Self-pay | Admitting: Family Medicine

## 2020-08-18 NOTE — Telephone Encounter (Signed)
Pt called in asking for a new handicap form to be filled out, please advise   Pt can be reached at the home #

## 2020-08-24 ENCOUNTER — Encounter: Payer: Self-pay | Admitting: *Deleted

## 2020-09-07 DIAGNOSIS — S8262XA Displaced fracture of lateral malleolus of left fibula, initial encounter for closed fracture: Secondary | ICD-10-CM | POA: Insufficient documentation

## 2020-09-16 ENCOUNTER — Other Ambulatory Visit: Payer: Self-pay

## 2020-09-16 ENCOUNTER — Encounter (HOSPITAL_BASED_OUTPATIENT_CLINIC_OR_DEPARTMENT_OTHER): Payer: Self-pay | Admitting: Orthopedic Surgery

## 2020-09-16 ENCOUNTER — Other Ambulatory Visit (HOSPITAL_COMMUNITY): Payer: Self-pay | Admitting: Orthopedic Surgery

## 2020-09-22 ENCOUNTER — Ambulatory Visit (HOSPITAL_BASED_OUTPATIENT_CLINIC_OR_DEPARTMENT_OTHER): Payer: No Typology Code available for payment source | Admitting: Anesthesiology

## 2020-09-22 ENCOUNTER — Encounter (HOSPITAL_BASED_OUTPATIENT_CLINIC_OR_DEPARTMENT_OTHER): Payer: Self-pay | Admitting: Orthopedic Surgery

## 2020-09-22 ENCOUNTER — Ambulatory Visit (HOSPITAL_BASED_OUTPATIENT_CLINIC_OR_DEPARTMENT_OTHER)
Admission: RE | Admit: 2020-09-22 | Discharge: 2020-09-22 | Disposition: A | Discharge status: Home / Self Care | Payer: No Typology Code available for payment source | Attending: Orthopedic Surgery | Admitting: Orthopedic Surgery

## 2020-09-22 ENCOUNTER — Encounter (HOSPITAL_BASED_OUTPATIENT_CLINIC_OR_DEPARTMENT_OTHER)
Admission: RE | Disposition: A | Discharge status: Home / Self Care | Payer: Self-pay | Source: Home / Self Care | Attending: Orthopedic Surgery

## 2020-09-22 ENCOUNTER — Other Ambulatory Visit: Payer: Self-pay

## 2020-09-22 DIAGNOSIS — S8262XA Displaced fracture of lateral malleolus of left fibula, initial encounter for closed fracture: Secondary | ICD-10-CM | POA: Diagnosis present

## 2020-09-22 DIAGNOSIS — S93401A Sprain of unspecified ligament of right ankle, initial encounter: Secondary | ICD-10-CM | POA: Insufficient documentation

## 2020-09-22 DIAGNOSIS — Z79899 Other long term (current) drug therapy: Secondary | ICD-10-CM | POA: Insufficient documentation

## 2020-09-22 DIAGNOSIS — W109XXA Fall (on) (from) unspecified stairs and steps, initial encounter: Secondary | ICD-10-CM | POA: Diagnosis not present

## 2020-09-22 DIAGNOSIS — Z86711 Personal history of pulmonary embolism: Secondary | ICD-10-CM | POA: Diagnosis not present

## 2020-09-22 DIAGNOSIS — Z79890 Hormone replacement therapy (postmenopausal): Secondary | ICD-10-CM | POA: Diagnosis not present

## 2020-09-22 HISTORY — PX: SYNDESMOSIS REPAIR: SHX5182

## 2020-09-22 HISTORY — PX: ORIF ANKLE FRACTURE: SHX5408

## 2020-09-22 LAB — BASIC METABOLIC PANEL
Anion gap: 13 (ref 5–15)
BUN: 8 mg/dL (ref 8–23)
CO2: 22 mmol/L (ref 22–32)
Calcium: 9.6 mg/dL (ref 8.9–10.3)
Chloride: 105 mmol/L (ref 98–111)
Creatinine, Ser: 0.64 mg/dL (ref 0.44–1.00)
GFR, Estimated: >60 mL/min (ref >60)
Glucose, Bld: 101 mg/dL — ABNORMAL HIGH (ref 70–99)
Potassium: 3.7 mmol/L (ref 3.5–5.1)
Sodium: 140 mmol/L (ref 135–145)

## 2020-09-22 SURGERY — OPEN REDUCTION INTERNAL FIXATION (ORIF) ANKLE FRACTURE
Anesthesia: Regional | Site: Ankle | Laterality: Left

## 2020-09-22 MED ORDER — CEFAZOLIN SODIUM-DEXTROSE 2-4 GM/100ML-% IV SOLN
2.0000 g | INTRAVENOUS | Status: AC
  Administered 2020-09-22: 2 g via INTRAVENOUS

## 2020-09-22 MED ORDER — APIXABAN 2.5 MG PO TABS: 2.5000 mg | ORAL_TABLET | Freq: Two times a day (BID) | ORAL | 0 refills | Status: DC

## 2020-09-22 MED ORDER — ONDANSETRON HCL 4 MG/2ML IJ SOLN
INTRAMUSCULAR | Status: DC | PRN
  Administered 2020-09-22: 4 mg via INTRAVENOUS

## 2020-09-22 MED ORDER — PROPOFOL 10 MG/ML IV BOLUS
INTRAVENOUS | Status: DC | PRN
  Administered 2020-09-22: 150 mg via INTRAVENOUS
  Administered 2020-09-22: 50 mg via INTRAVENOUS

## 2020-09-22 MED ORDER — ACETAMINOPHEN 500 MG PO TABS
ORAL_TABLET | ORAL | Status: AC
  Filled 2020-09-22: qty 1

## 2020-09-22 MED ORDER — FENTANYL CITRATE (PF) 100 MCG/2ML IJ SOLN
50.0000 ug | Freq: Once | INTRAMUSCULAR | Status: AC
  Administered 2020-09-22: 50 ug via INTRAVENOUS

## 2020-09-22 MED ORDER — MIDAZOLAM HCL 2 MG/2ML IJ SOLN
1.0000 mg | Freq: Once | INTRAMUSCULAR | Status: AC
  Administered 2020-09-22: 1 mg via INTRAVENOUS

## 2020-09-22 MED ORDER — FENTANYL CITRATE (PF) 100 MCG/2ML IJ SOLN
INTRAMUSCULAR | Status: AC
  Filled 2020-09-22: qty 2

## 2020-09-22 MED ORDER — SODIUM CHLORIDE 0.9 % IV SOLN: INTRAVENOUS | Status: DC

## 2020-09-22 MED ORDER — HYDROMORPHONE HCL 1 MG/ML IJ SOLN: 0.2500 mg | INTRAMUSCULAR | Status: DC | PRN

## 2020-09-22 MED ORDER — ACETAMINOPHEN 500 MG PO TABS
1000.0000 mg | ORAL_TABLET | Freq: Once | ORAL | Status: AC
  Administered 2020-09-22: 1000 mg via ORAL

## 2020-09-22 MED ORDER — OXYCODONE HCL 5 MG/5ML PO SOLN: 5.0000 mg | Freq: Once | ORAL | Status: DC | PRN

## 2020-09-22 MED ORDER — 0.9 % SODIUM CHLORIDE (POUR BTL) OPTIME
TOPICAL | Status: DC | PRN
  Administered 2020-09-22: 200 mL

## 2020-09-22 MED ORDER — MEPERIDINE HCL 25 MG/ML IJ SOLN: 6.2500 mg | INTRAMUSCULAR | Status: DC | PRN

## 2020-09-22 MED ORDER — CEFAZOLIN SODIUM-DEXTROSE 2-4 GM/100ML-% IV SOLN
INTRAVENOUS | Status: AC
  Filled 2020-09-22: qty 100

## 2020-09-22 MED ORDER — OXYCODONE HCL 5 MG PO TABS: 5.0000 mg | ORAL_TABLET | Freq: Once | ORAL | Status: DC | PRN

## 2020-09-22 MED ORDER — LIDOCAINE 2% (20 MG/ML) 5 ML SYRINGE
INTRAMUSCULAR | Status: DC | PRN
  Administered 2020-09-22: 30 mg via INTRAVENOUS

## 2020-09-22 MED ORDER — PHENYLEPHRINE HCL (PRESSORS) 10 MG/ML IV SOLN
INTRAVENOUS | Status: DC | PRN
  Administered 2020-09-22 (×2): 80 ug via INTRAVENOUS

## 2020-09-22 MED ORDER — ROPIVACAINE HCL 5 MG/ML IJ SOLN
INTRAMUSCULAR | Status: DC | PRN
  Administered 2020-09-22: 40 mL via PERINEURAL

## 2020-09-22 MED ORDER — PROMETHAZINE HCL 25 MG/ML IJ SOLN: 6.2500 mg | INTRAMUSCULAR | Status: DC | PRN

## 2020-09-22 MED ORDER — LACTATED RINGERS IV SOLN: INTRAVENOUS | Status: DC

## 2020-09-22 MED ORDER — DEXAMETHASONE SODIUM PHOSPHATE 10 MG/ML IJ SOLN
INTRAMUSCULAR | Status: DC | PRN
  Administered 2020-09-22: 4 mg via INTRAVENOUS

## 2020-09-22 MED ORDER — EPHEDRINE SULFATE 50 MG/ML IJ SOLN
INTRAMUSCULAR | Status: DC | PRN
Start: 2020-09-22 — End: 2020-09-22
  Administered 2020-09-22 (×3): 10 mg via INTRAVENOUS

## 2020-09-22 MED ORDER — MIDAZOLAM HCL 2 MG/2ML IJ SOLN
INTRAMUSCULAR | Status: AC
  Filled 2020-09-22: qty 2

## 2020-09-22 MED ORDER — DEXAMETHASONE SODIUM PHOSPHATE 10 MG/ML IJ SOLN
INTRAMUSCULAR | Status: DC | PRN
  Administered 2020-09-22: 10 mg

## 2020-09-22 MED ORDER — VANCOMYCIN HCL 500 MG IV SOLR
INTRAVENOUS | Status: AC
  Filled 2020-09-22: qty 500

## 2020-09-22 MED ORDER — VANCOMYCIN HCL 500 MG IV SOLR
INTRAVENOUS | Status: DC | PRN
  Administered 2020-09-22: 500 mg via TOPICAL

## 2020-09-22 MED ORDER — KETOROLAC TROMETHAMINE 30 MG/ML IJ SOLN
30.0000 mg | Freq: Once | INTRAMUSCULAR | Status: DC | PRN
Start: 2020-09-22 — End: 2020-09-22

## 2020-09-22 MED ORDER — OXYCODONE HCL 5 MG PO TABS: 5.0000 mg | ORAL_TABLET | Freq: Four times a day (QID) | ORAL | 0 refills | Status: AC | PRN

## 2020-09-22 MED ORDER — ACETAMINOPHEN 500 MG PO TABS
ORAL_TABLET | ORAL | Status: AC
  Filled 2020-09-22: qty 2

## 2020-09-22 SURGICAL SUPPLY — 77 items
APL PRP STRL LF DISP 70% ISPRP (MISCELLANEOUS) ×1
BANDAGE ESMARK 6X9 LF (GAUZE/BANDAGES/DRESSINGS) IMPLANT
BIT DRILL 2.5X2.75 QC CALB (BIT) ×1 IMPLANT
BIT DRILL 3.5X5.5 QC CALB (BIT) ×1 IMPLANT
BLADE SURG 15 STRL LF DISP TIS (BLADE) ×2 IMPLANT
BLADE SURG 15 STRL SS (BLADE) ×4
BNDG CMPR 9X4 STRL LF SNTH (GAUZE/BANDAGES/DRESSINGS)
BNDG CMPR 9X6 STRL LF SNTH (GAUZE/BANDAGES/DRESSINGS)
BNDG COHESIVE 4X5 TAN STRL (GAUZE/BANDAGES/DRESSINGS) ×2 IMPLANT
BNDG COHESIVE 6X5 TAN ST LF (GAUZE/BANDAGES/DRESSINGS) ×2 IMPLANT
BNDG ESMARK 4X9 LF (GAUZE/BANDAGES/DRESSINGS) IMPLANT
BNDG ESMARK 6X9 LF (GAUZE/BANDAGES/DRESSINGS)
CANISTER SUCT 1200ML W/VALVE (MISCELLANEOUS) ×2 IMPLANT
CHLORAPREP W/TINT 26 (MISCELLANEOUS) ×2 IMPLANT
COVER BACK TABLE 60X90IN (DRAPES) ×2 IMPLANT
CUFF TOURN SGL QUICK 34 (TOURNIQUET CUFF) ×2
CUFF TOURN SGL QUICK 42 (TOURNIQUET CUFF) IMPLANT
CUFF TRNQT CYL 34X4.125X (TOURNIQUET CUFF) IMPLANT
DECANTER SPIKE VIAL GLASS SM (MISCELLANEOUS) IMPLANT
DRAPE EXTREMITY T 121X128X90 (DISPOSABLE) ×2 IMPLANT
DRAPE OEC MINIVIEW 54X84 (DRAPES) ×2 IMPLANT
DRAPE U-SHAPE 47X51 STRL (DRAPES) ×2 IMPLANT
DRSG MEPITEL 4X7.2 (GAUZE/BANDAGES/DRESSINGS) ×2 IMPLANT
DRSG PAD ABDOMINAL 8X10 ST (GAUZE/BANDAGES/DRESSINGS) ×4 IMPLANT
ELECT REM PT RETURN 9FT ADLT (ELECTROSURGICAL) ×2
ELECTRODE REM PT RTRN 9FT ADLT (ELECTROSURGICAL) ×1 IMPLANT
FIXATION ZIPTIGHT ANKLE SNDSMS (Ankle) IMPLANT
GAUZE SPONGE 4X4 12PLY STRL (GAUZE/BANDAGES/DRESSINGS) ×2 IMPLANT
GLOVE SRG 8 PF TXTR STRL LF DI (GLOVE) ×2 IMPLANT
GLOVE SURG ENC MOIS LTX SZ8 (GLOVE) ×2 IMPLANT
GLOVE SURG LTX SZ8 (GLOVE) ×1 IMPLANT
GLOVE SURG POLYISO LF SZ7 (GLOVE) ×1 IMPLANT
GLOVE SURG UNDER POLY LF SZ7 (GLOVE) ×2 IMPLANT
GLOVE SURG UNDER POLY LF SZ8 (GLOVE) ×2
GOWN STRL REUS W/ TWL LRG LVL3 (GOWN DISPOSABLE) ×1 IMPLANT
GOWN STRL REUS W/ TWL XL LVL3 (GOWN DISPOSABLE) ×2 IMPLANT
GOWN STRL REUS W/TWL LRG LVL3 (GOWN DISPOSABLE) ×2
GOWN STRL REUS W/TWL XL LVL3 (GOWN DISPOSABLE) ×2
NEEDLE HYPO 22GX1.5 SAFETY (NEEDLE) IMPLANT
NS IRRIG 1000ML POUR BTL (IV SOLUTION) ×2 IMPLANT
PACK BASIN DAY SURGERY FS (CUSTOM PROCEDURE TRAY) ×2 IMPLANT
PAD CAST 4YDX4 CTTN HI CHSV (CAST SUPPLIES) ×1 IMPLANT
PADDING CAST ABS 4INX4YD NS (CAST SUPPLIES)
PADDING CAST ABS COTTON 4X4 ST (CAST SUPPLIES) IMPLANT
PADDING CAST COTTON 4X4 STRL (CAST SUPPLIES) ×2
PADDING CAST COTTON 6X4 STRL (CAST SUPPLIES) ×2 IMPLANT
PENCIL SMOKE EVACUATOR (MISCELLANEOUS) ×2 IMPLANT
PLATE ACE 100DEG 6HOLE (Plate) ×1 IMPLANT
SANITIZER HAND PURELL 535ML FO (MISCELLANEOUS) ×2 IMPLANT
SCREW CORTICAL 3.5MM  12MM (Screw) ×2 IMPLANT
SCREW CORTICAL 3.5MM  20MM (Screw) ×2 IMPLANT
SCREW CORTICAL 3.5MM 12MM (Screw) IMPLANT
SCREW CORTICAL 3.5MM 14MM (Screw) ×2 IMPLANT
SCREW CORTICAL 3.5MM 18MM (Screw) ×2 IMPLANT
SCREW CORTICAL 3.5MM 20MM (Screw) IMPLANT
SHEET MEDIUM DRAPE 40X70 STRL (DRAPES) ×2 IMPLANT
SLEEVE SCD COMPRESS KNEE MED (STOCKING) ×2 IMPLANT
SPLINT FAST PLASTER 5X30 (CAST SUPPLIES) ×20
SPLINT PLASTER CAST FAST 5X30 (CAST SUPPLIES) ×20 IMPLANT
SPONGE T-LAP 18X18 ~~LOC~~+RFID (SPONGE) ×2 IMPLANT
STOCKINETTE 6  STRL (DRAPES) ×2
STOCKINETTE 6 STRL (DRAPES) ×1 IMPLANT
SUCTION FRAZIER HANDLE 10FR (MISCELLANEOUS) ×2
SUCTION TUBE FRAZIER 10FR DISP (MISCELLANEOUS) ×1 IMPLANT
SUT ETHILON 3 0 PS 1 (SUTURE) ×2 IMPLANT
SUT FIBERWIRE #2 38 T-5 BLUE (SUTURE)
SUT MNCRL AB 3-0 PS2 18 (SUTURE) IMPLANT
SUT VIC AB 0 SH 27 (SUTURE) IMPLANT
SUT VIC AB 2-0 SH 27 (SUTURE) ×2
SUT VIC AB 2-0 SH 27XBRD (SUTURE) ×1 IMPLANT
SUTURE FIBERWR #2 38 T-5 BLUE (SUTURE) IMPLANT
SYR BULB EAR ULCER 3OZ GRN STR (SYRINGE) ×2 IMPLANT
SYR CONTROL 10ML LL (SYRINGE) IMPLANT
TOWEL GREEN STERILE FF (TOWEL DISPOSABLE) ×4 IMPLANT
TUBE CONNECTING 20X1/4 (TUBING) ×2 IMPLANT
UNDERPAD 30X36 HEAVY ABSORB (UNDERPADS AND DIAPERS) ×2 IMPLANT
ZIPTIGHT ANKLE SYNODESMOSS FIX (Ankle) ×2 IMPLANT

## 2020-09-22 NOTE — Anesthesia Preprocedure Evaluation (Addendum)
Anesthesia Evaluation  Patient identified by MRN, date of birth, ID band Patient awake    Reviewed: Allergy & Precautions, NPO status , Patient's Chart, lab work & pertinent test results, reviewed documented beta blocker date and time   Airway Mallampati: I  TM Distance: >3 FB Neck ROM: Full    Dental no notable dental hx. (+) Teeth Intact, Missing, Dental Advisory Given,    Pulmonary PE (2021)   Pulmonary exam normal breath sounds clear to auscultation       Cardiovascular hypertension, Pt. on medications and Pt. on home beta blockers Normal cardiovascular exam Rhythm:Regular Rate:Normal     Neuro/Psych PSYCHIATRIC DISORDERS Depression negative neurological ROS     GI/Hepatic negative GI ROS, Neg liver ROS,   Endo/Other  Hypothyroidism Obesity BMI 36  Renal/GU negative Renal ROS  negative genitourinary   Musculoskeletal  (+) Arthritis , Osteoarthritis,  Closed fx L lateral malleolus   Abdominal (+) + obese,   Peds  Hematology negative hematology ROS (+)   Anesthesia Other Findings   Reproductive/Obstetrics negative OB ROS                            Anesthesia Physical Anesthesia Plan  ASA: 2  Anesthesia Plan: General and Regional   Post-op Pain Management: GA combined w/ Regional for post-op pain   Induction: Intravenous  PONV Risk Score and Plan: 3 and Ondansetron, Dexamethasone, Midazolam and Treatment may vary due to age or medical condition  Airway Management Planned: LMA  Additional Equipment: None  Intra-op Plan:   Post-operative Plan: Extubation in OR  Informed Consent: I have reviewed the patients History and Physical, chart, labs and discussed the procedure including the risks, benefits and alternatives for the proposed anesthesia with the patient or authorized representative who has indicated his/her understanding and acceptance.     Dental advisory given  Plan  Discussed with: CRNA  Anesthesia Plan Comments:        Anesthesia Quick Evaluation

## 2020-09-22 NOTE — Transfer of Care (Signed)
Immediate Anesthesia Transfer of Care Note  Patient: Jamie Kelley  Procedure(s) Performed: OPEN REDUCTION INTERNAL FIXATION (ORIF) left lateral malleolus fracturre (Left: Ankle) Internal fixation syndesmosis (Left: Ankle)  Patient Location: PACU  Anesthesia Type:GA combined with regional for post-op pain  Level of Consciousness: sedated  Airway & Oxygen Therapy: Patient Spontanous Breathing and Patient connected to face mask oxygen  Post-op Assessment: Report given to RN and Post -op Vital signs reviewed and stable  Post vital signs: Reviewed and stable  Last Vitals:  Vitals Value Taken Time  BP 111/68 09/22/20 1321  Temp    Pulse 65 09/22/20 1323  Resp 18 09/22/20 1323  SpO2 96 % 09/22/20 1323  Vitals shown include unvalidated device data.  Last Pain:  Vitals:   09/22/20 1039  TempSrc: Oral  PainSc: 2          Complications: No notable events documented.

## 2020-09-22 NOTE — Discharge Instructions (Addendum)
Jamie Simmer, MD [facility]  Please read the following information regarding your care after surgery.  Medications  You only need a prescription for the narcotic pain medicine (ex. oxycodone, Percocet, Norco).  All of the other medicines listed below are available over the counter. X Aleve 2 pills twice a day for the first 3 days after surgery. X acetominophen (Tylenol) 650 mg every 4-6 hours as you need for minor to moderate pain X oxycodone as prescribed for severe pain  Narcotic pain medicine (ex. oxycodone, Percocet, Vicodin) will cause constipation.  To prevent this problem, take the following medicines while you are taking any pain medicine. X docusate sodium (Colace) 100 mg twice a day X senna (Senokot) 2 tablets twice a day  X To help prevent blood clots, take Eliquis twice a day for two weeks starting the day after surgery.  Then take a baby aspirin (81 mg) twice a day for the following month.  You should also get up every hour while you are awake to move around.    Weight Bearing ? Bear weight when you are able on your operated leg or foot. ? Bear weight only on your operated foot in the post-op shoe. X Do not bear any weight on the operated leg or foot.  Cast / Splint / Dressing X Keep your splint, cast or dressing clean and dry.  Don't put anything (coat hanger, pencil, etc) down inside of it.  If it gets damp, use a hair dryer on the cool setting to dry it.  If it gets soaked, call the office to schedule an appointment for a cast change. ? Remove your dressing 3 days after surgery and cover the incisions with dry dressings.    After your dressing, cast or splint is removed; you may shower, but do not soak or scrub the wound.  Allow the water to run over it, and then gently pat it dry.  Swelling It is normal for you to have swelling where you had surgery.  To reduce swelling and pain, keep your toes above your nose for at least 3 days after surgery.  It may be necessary to  keep your foot or leg elevated for several weeks.  If it hurts, it should be elevated.  Follow Up Call my office at [phone] when you are discharged from the hospital or surgery center to schedule an appointment to be seen two weeks after surgery.  Call my office at [phone] if you develop a fever >101.5 F, nausea, vomiting, bleeding from the surgical site or severe pain.      May have Tylenol after 5pm, if needed.   Post Anesthesia Home Care Instructions  Activity: Get plenty of rest for the remainder of the day. A responsible individual must stay with you for 24 hours following the procedure.  For the next 24 hours, DO NOT: -Drive a car -Paediatric nurse -Drink alcoholic beverages -Take any medication unless instructed by your physician -Make any legal decisions or sign important papers.  Meals: Start with liquid foods such as gelatin or soup. Progress to regular foods as tolerated. Avoid greasy, spicy, heavy foods. If nausea and/or vomiting occur, drink only clear liquids until the nausea and/or vomiting subsides. Call your physician if vomiting continues.  Special Instructions/Symptoms: Your throat may feel dry or sore from the anesthesia or the breathing tube placed in your throat during surgery. If this causes discomfort, gargle with warm salt water. The discomfort should disappear within 24 hours.  If you had  a scopolamine patch placed behind your ear for the management of post- operative nausea and/or vomiting:  1. The medication in the patch is effective for 72 hours, after which it should be removed.  Wrap patch in a tissue and discard in the trash. Wash hands thoroughly with soap and water. 2. You may remove the patch earlier than 72 hours if you experience unpleasant side effects which may include dry mouth, dizziness or visual disturbances. 3. Avoid touching the patch. Wash your hands with soap and water after contact with the patch.    Regional Anesthesia  Blocks  1. Numbness or the inability to move the "blocked" extremity may last from 3-48 hours after placement. The length of time depends on the medication injected and your individual response to the medication. If the numbness is not going away after 48 hours, call your surgeon.  2. The extremity that is blocked will need to be protected until the numbness is gone and the  Strength has returned. Because you cannot feel it, you will need to take extra care to avoid injury. Because it may be weak, you may have difficulty moving it or using it. You may not know what position it is in without looking at it while the block is in effect.  3. For blocks in the legs and feet, returning to weight bearing and walking needs to be done carefully. You will need to wait until the numbness is entirely gone and the strength has returned. You should be able to move your leg and foot normally before you try and bear weight or walk. You will need someone to be with you when you first try to ensure you do not fall and possibly risk injury.  4. Bruising and tenderness at the needle site are common side effects and will resolve in a few days.  5. Persistent numbness or new problems with movement should be communicated to the surgeon or the [facility] [phone] [place] [phone]).

## 2020-09-22 NOTE — Progress Notes (Signed)
Assisted Dr. Doroteo Glassman with left, ultrasound guided, popliteal/saphenous block. Side rails up, monitors on throughout procedure. See vital signs in flow sheet. Tolerated Procedure well.

## 2020-09-22 NOTE — Anesthesia Procedure Notes (Signed)
Procedure Name: LMA Insertion Date/Time: 09/22/2020 12:21 PM Performed by: Signe Colt, CRNA Pre-anesthesia Checklist: Patient identified, Emergency Drugs available, Suction available and Patient being monitored Patient Re-evaluated:Patient Re-evaluated prior to induction Oxygen Delivery Method: Circle System Utilized Preoxygenation: Pre-oxygenation with 100% oxygen Induction Type: IV induction Ventilation: Mask ventilation without difficulty LMA: LMA inserted LMA Size: 4.0 Number of attempts: 1 Airway Equipment and Method: bite block Placement Confirmation: positive ETCO2 Tube secured with: Tape Dental Injury: Teeth and Oropharynx as per pre-operative assessment

## 2020-09-22 NOTE — Anesthesia Procedure Notes (Signed)
Anesthesia Regional Block: Adductor canal block   Pre-Anesthetic Checklist: , timeout performed,  Correct Patient, Correct Site, Correct Laterality,  Correct Procedure, Correct Position, site marked,  Risks and benefits discussed,  Surgical consent,  Pre-op evaluation,  At surgeon's request and post-op pain management  Laterality: Left  Prep: Maximum Sterile Barrier Precautions used, chloraprep       Needles:  Injection technique: Single-shot  Needle Type: Echogenic Stimulator Needle     Needle Length: 9cm  Needle Gauge: 22     Additional Needles:   Procedures:,,,, ultrasound used (permanent image in chart),,    Narrative:  Start time: 09/22/2020 11:20 AM End time: 09/22/2020 11:25 AM Injection made incrementally with aspirations every 5 mL.  Performed by: Personally  Anesthesiologist: Pervis Hocking, DO  Additional Notes: Monitors applied. No increased pain on injection. No increased resistance to injection. Injection made in 5cc increments. Good needle visualization. Patient tolerated procedure well.

## 2020-09-22 NOTE — H&P (Signed)
Jamie Kelley is an 65 y.o. female.   Chief Complaint: Left ankle injury HPI: The patient is a 65 year old woman without significant past medical history.  She fell on her steps about 2 weeks ago injuring her left ankle.  Initially her films showed a nondisplaced lateral malleolus fracture.  She was treated in a short leg cast.  Early follow-up films showed displacement of the fracture.  She presents now for operative treatment of this unstable lateral malleolus fracture.  Past Medical History:  Diagnosis Date   Arthritis    Hay fever    High blood pressure    Hyperlipidemia    Hypothyroidism    Obesity    Pulmonary embolism ([place]) 01/01/2020   Thyroid disease     Past Surgical History:  Procedure Laterality Date   BIOPSY THYROID     Dr Constance Holster- negative for cancer   COLONOSCOPY     20's    Family History  Problem Relation Age of Onset   Heart disease Mother    Hypertension Mother    Diabetes Mother    Alzheimer's disease Mother    Cancer Maternal Aunt        breast   Heart disease Maternal Aunt    Hypertension Maternal Aunt    Breast cancer Maternal Aunt 70   Hypertension Maternal Uncle    Heart disease Maternal Uncle    Heart disease Maternal Grandmother    Hypertension Maternal Grandmother    Diabetes Maternal Grandmother    Colon cancer Neg Hx    Esophageal cancer Neg Hx    Colon polyps Neg Hx    Social History:  reports that she has never smoked. She has never used smokeless tobacco. She reports that she does not drink alcohol and does not use drugs.  Allergies: No Known Allergies  Medications Prior to Admission  Medication Sig Dispense Refill   amLODipine (NORVASC) 10 MG tablet Take 1 tablet (10 mg total) by mouth daily. 90 tablet 1   atenolol (TENORMIN) 50 MG tablet Take 2 tablets (100 mg total) by mouth daily. 180 tablet 1   hydrochlorothiazide (HYDRODIURIL) 25 MG tablet Take 1 tablet (25 mg total) by mouth daily. 90 tablet 1   levocetirizine (XYZAL) 5 MG tablet  Take 1 tablet by mouth every evening.     levothyroxine (SYNTHROID) 50 MCG tablet TAKE 1/2 (ONE-HALF) TABLET BY MOUTH ONCE DAILY BEFORE BREAKFAST 45 tablet 1   simvastatin (ZOCOR) 20 MG tablet Take 1 tablet (20 mg total) by mouth at bedtime. 90 tablet 1   Vitamin D, Ergocalciferol, (DRISDOL) 1.25 MG (50000 UNIT) CAPS capsule Take 1 capsule (50,000 Units total) by mouth every 7 (seven) days. 12 capsule 0   albuterol (VENTOLIN HFA) 108 (90 Base) MCG/ACT inhaler Inhale 1-2 puffs into the lungs every 4 (four) hours as needed for wheezing or shortness of breath. 6.7 g 1   fluticasone (FLONASE) 50 MCG/ACT nasal spray Place 1 spray into both nostrils 2 (two) times daily. (Patient taking differently: Place 1 spray into both nostrils as needed.) 16 g 0    No results found for this or any previous visit (from the past 48 hour(s)). No results found.  Review of Systems no recent fever, chills, nausea, vomiting or changes in her appetite  Blood pressure 125/74, pulse (!) 57, temperature 99 F (37.2 C), temperature source Oral, resp. rate 18, height '5\' 2"'$  (1.575 m), weight 88 kg, last menstrual period 03/10/2012, SpO2 97 %. Physical Exam  Well-nourished well-developed woman in  no apparent distress.  Alert and oriented x4.  Normal mood and affect.  Gait is nonweightbearing on the left.  Left ankle has slight swelling.  Skin is healthy and intact.  Pulses are palpable in the foot.  No lymphadenopathy.  Active plantar flexion and dorsiflexion strength at the toes.   Assessment/Plan Left ankle lateral malleolus fracture and possible syndesmosis disruption -to the operating room today for open treatment with internal fixation.  The risks and benefits of the alternative treatment options have been discussed in detail.  The patient wishes to proceed with surgery and specifically understands risks of bleeding, infection, nerve damage, blood clots, need for additional surgery, amputation and death.   Wylene Simmer,  MD October 12, 2020, 11:39 AM

## 2020-09-22 NOTE — Op Note (Signed)
09/22/2020  1:26 PM  PATIENT:  Jamie Kelley  65 y.o. female  PRE-OPERATIVE DIAGNOSIS: 1.  Left ankle displaced lateral malleolus fracture  POST-OPERATIVE DIAGNOSIS:   1.  Left ankle displaced lateral malleolus fracture 2.  Left ankle syndesmosis sprain  Procedure(s): 1.  Open treatment of left ankle lateral malleolus fracture with internal fixation 2.  Stress examination of the left ankle under fluoroscopy 3.  Open treatment of left ankle syndesmosis disruption with internal fixation 4.  AP, mortise and lateral radiographs of the left ankle  SURGEON:  Wylene Simmer, MD  ASSISTANT: None  ANESTHESIA:   General, regional  EBL:  minimal   TOURNIQUET:   Total Tourniquet Time Documented: Thigh (Left) - 35 minutes Total: Thigh (Left) - 35 minutes  COMPLICATIONS:  None apparent  DISPOSITION:  Extubated, awake and stable to recovery.  INDICATION FOR PROCEDURE: The patient is a 65 year old female who fell down some stairs at home a few weeks ago.  Initially her films showed a nondisplaced lateral malleolus fracture.  After a brief period of closed treatment follow-up films showed displacement of the lateral malleolus fracture indicating instability.  She presents now for operative treatment of this injury.  The risks and benefits of the alternative treatment options have been discussed in detail.  The patient wishes to proceed with surgery and specifically understands risks of bleeding, infection, nerve damage, blood clots, need for additional surgery, amputation and death.   PROCEDURE IN DETAIL:  After pre operative consent was obtained, and the correct operative site was identified, the patient was brought to the operating room and placed supine on the OR table.  Anesthesia was administered.  Pre-operative antibiotics were administered.  A surgical timeout was taken.  The left lower extremity was prepped and draped in standard sterile fashion with a tourniquet around the thigh.  The extremity  was elevated and the tourniquet was inflated to 300 mmHg.  A longitudinal incision was made over the lateral malleolus.  Dissection was carried sharply down through the subcutaneous tissues.  The fracture site was identified.  It was cleaned of hematoma and periosteum.  The fracture was irrigated.  It was reduced and held with a tenaculum.  A 3.5 mm lag screw was inserted from anterior to posterior across the fracture site.  It was noted to have excellent purchase and compressed the fracture site appropriately.  A 6 hole one third tubular plate from the [employer] small frag set was contoured to fit the lateral malleolus.  It was secured distally with 2 unicortical screws and proximally with 3 bicortical screws.  AP, mortise and lateral radiographs confirmed appropriate reduction of the fracture in appropriate position and length of the hardware.  Stress examination was then performed.  Dorsiflexion and external rotation stress was applied to the supinated forefoot while viewing the ankle on the mortise x-ray.  The medial clear space and syndesmosis were both noted to widen.  The decision was made to proceed with syndesmosis reduction and fixation.  The third most distal screw in the plate was selected.  It was removed.  A drill hole was made across all 4 cortices of the distal fibula and tibia through this hole in the plate.  A zip tight suture button device was then inserted and passed through to the medial cortex of the distal tibia.  A small incision was made medially and the suture button was seated appropriately against the cortical bone taking care to protect the saphenous nerve and vein.  The suture  button device was then tightened securely.  Repeat radiographs confirmed appropriate reduction of the fracture and syndesmosis and appropriate position and length of all hardware.  The wounds were then irrigated copiously.  Subcutaneous tissues were sprinkled with vancomycin powder.  They were then closed  with 2-0 Vicryl inverted simple sutures.  The skin incisions were closed with running 3-0 nylon.  Sterile dressings were applied followed by a well-padded short leg splint.  The tourniquet was released after application of the dressings.  The patient was awakened from anesthesia and transported to the recovery room in stable condition.   FOLLOW UP PLAN: Nonweightbearing on the left lower extremity.  Follow-up in the office in 2 weeks for suture removal and conversion to a short leg cast.  Eliquis for DVT prophylaxis.   RADIOGRAPHS: AP, mortise and lateral radiographs of the left ankle are obtained intraoperatively.  These show interval reduction fixation of the syndesmosis and the lateral malleolus fracture.  Hardware is appropriately positioned and of the appropriate lengths.  No other acute injuries are noted.

## 2020-09-22 NOTE — Anesthesia Postprocedure Evaluation (Signed)
Anesthesia Post Note  Patient: Layanna Wellendorf  Procedure(s) Performed: OPEN REDUCTION INTERNAL FIXATION (ORIF) left lateral malleolus fracturre (Left: Ankle) Internal fixation syndesmosis (Left: Ankle)     Patient location during evaluation: PACU Anesthesia Type: Regional and General Level of consciousness: awake and alert, oriented and patient cooperative Pain management: pain level controlled Vital Signs Assessment: post-procedure vital signs reviewed and stable Respiratory status: spontaneous breathing, nonlabored ventilation and respiratory function stable Cardiovascular status: blood pressure returned to baseline and stable Postop Assessment: no apparent nausea or vomiting Anesthetic complications: no   No notable events documented.  Last Vitals:  Vitals:   09/22/20 1321 09/22/20 1330  BP: 111/68 115/71  Pulse: 65 62  Resp: 17 16  Temp: (!) 36.3 C   SpO2: 96% 96%    Last Pain:  Vitals:   09/22/20 1321  TempSrc:   PainSc: [place]

## 2020-09-22 NOTE — Anesthesia Procedure Notes (Signed)
Anesthesia Regional Block: Popliteal block   Pre-Anesthetic Checklist: , timeout performed,  Correct Patient, Correct Site, Correct Laterality,  Correct Procedure, Correct Position, site marked,  Risks and benefits discussed,  Surgical consent,  Pre-op evaluation,  At surgeon's request and post-op pain management  Laterality: Left  Prep: Maximum Sterile Barrier Precautions used, chloraprep       Needles:  Injection technique: Single-shot  Needle Type: Echogenic Stimulator Needle     Needle Length: 9cm  Needle Gauge: 22     Additional Needles:   Procedures:,,,, ultrasound used (permanent image in chart),,    Narrative:  Start time: 09/22/2020 11:15 AM End time: 09/22/2020 11:20 AM Injection made incrementally with aspirations every 5 mL.  Performed by: Personally  Anesthesiologist: Pervis Hocking, DO  Additional Notes: Monitors applied. No increased pain on injection. No increased resistance to injection. Injection made in 5cc increments. Good needle visualization. Patient tolerated procedure well.

## 2020-09-23 ENCOUNTER — Encounter (HOSPITAL_BASED_OUTPATIENT_CLINIC_OR_DEPARTMENT_OTHER): Payer: Self-pay | Admitting: Orthopedic Surgery

## 2020-11-03 LAB — HM MAMMOGRAPHY

## 2020-12-09 ENCOUNTER — Encounter: Payer: Self-pay | Admitting: Family Medicine

## 2020-12-09 ENCOUNTER — Ambulatory Visit (INDEPENDENT_AMBULATORY_CARE_PROVIDER_SITE_OTHER): Payer: No Typology Code available for payment source | Admitting: Family Medicine

## 2020-12-09 ENCOUNTER — Other Ambulatory Visit: Payer: Self-pay

## 2020-12-09 VITALS — BP 124/82 | HR 71 | Temp 98.1°F | Resp 16 | Wt 200.6 lb

## 2020-12-09 DIAGNOSIS — E041 Nontoxic single thyroid nodule: Secondary | ICD-10-CM

## 2020-12-09 DIAGNOSIS — E785 Hyperlipidemia, unspecified: Secondary | ICD-10-CM

## 2020-12-09 DIAGNOSIS — Z23 Encounter for immunization: Secondary | ICD-10-CM | POA: Diagnosis not present

## 2020-12-09 DIAGNOSIS — E038 Other specified hypothyroidism: Secondary | ICD-10-CM | POA: Diagnosis not present

## 2020-12-09 DIAGNOSIS — I10 Essential (primary) hypertension: Secondary | ICD-10-CM | POA: Diagnosis not present

## 2020-12-09 LAB — HEPATIC FUNCTION PANEL
ALT: 15 U/L (ref 0–35)
AST: 18 U/L (ref 0–37)
Albumin: 4.4 g/dL (ref 3.5–5.2)
Alkaline Phosphatase: 94 U/L (ref 39–117)
Bilirubin, Direct: 0.1 mg/dL (ref 0.0–0.3)
Total Bilirubin: 0.6 mg/dL (ref 0.2–1.2)
Total Protein: 7.2 g/dL (ref 6.0–8.3)

## 2020-12-09 LAB — CBC WITH DIFFERENTIAL/PLATELET
Basophils Absolute: 0 10*3/uL (ref 0.0–0.1)
Basophils Relative: 0.7 % (ref 0.0–3.0)
Eosinophils Absolute: 0.2 10*3/uL (ref 0.0–0.7)
Eosinophils Relative: 3.1 % (ref 0.0–5.0)
HCT: 39.5 % (ref 36.0–46.0)
Hemoglobin: 13.3 g/dL (ref 12.0–15.0)
Lymphocytes Relative: 23.9 % (ref 12.0–46.0)
Lymphs Abs: 1.6 10*3/uL (ref 0.7–4.0)
MCHC: 33.7 g/dL (ref 30.0–36.0)
MCV: 89.5 fl (ref 78.0–100.0)
Monocytes Absolute: 0.5 10*3/uL (ref 0.1–1.0)
Monocytes Relative: 7.8 % (ref 3.0–12.0)
Neutro Abs: 4.3 10*3/uL (ref 1.4–7.7)
Neutrophils Relative %: 64.5 % (ref 43.0–77.0)
Platelets: 202 10*3/uL (ref 150.0–400.0)
RBC: 4.41 Mil/uL (ref 3.87–5.11)
RDW: 14.7 % (ref 11.5–15.5)
WBC: 6.7 10*3/uL (ref 4.0–10.5)

## 2020-12-09 LAB — BASIC METABOLIC PANEL
BUN: 7 mg/dL (ref 6–23)
CO2: 27 mEq/L (ref 19–32)
Calcium: 9.6 mg/dL (ref 8.4–10.5)
Chloride: 102 mEq/L (ref 96–112)
Creatinine, Ser: 0.61 mg/dL (ref 0.40–1.20)
GFR: 95.21 mL/min (ref >60.00)
Glucose, Bld: 97 mg/dL (ref 70–99)
Potassium: 3.9 mEq/L (ref 3.5–5.1)
Sodium: 140 mEq/L (ref 135–145)

## 2020-12-09 LAB — LIPID PANEL
Cholesterol: 190 mg/dL (ref 0–200)
HDL: 64.6 mg/dL (ref >39.00)
LDL Cholesterol: 104 mg/dL — ABNORMAL HIGH (ref 0–99)
NonHDL: 125.62
Total CHOL/HDL Ratio: 3
Triglycerides: 108 mg/dL (ref 0.0–149.0)
VLDL: 21.6 mg/dL (ref 0.0–40.0)

## 2020-12-09 LAB — TSH: TSH: 1.18 u[IU]/mL (ref 0.35–5.50)

## 2020-12-09 MED ORDER — HYDROCHLOROTHIAZIDE 25 MG PO TABS: 25.0000 mg | ORAL_TABLET | Freq: Every day | ORAL | 1 refills | Status: DC

## 2020-12-09 MED ORDER — SIMVASTATIN 20 MG PO TABS: 20.0000 mg | ORAL_TABLET | Freq: Every day | ORAL | 1 refills | Status: DC

## 2020-12-09 MED ORDER — LEVOTHYROXINE SODIUM 50 MCG PO TABS: ORAL_TABLET | ORAL | 1 refills | Status: DC

## 2020-12-09 MED ORDER — ATENOLOL 50 MG PO TABS: 100.0000 mg | ORAL_TABLET | Freq: Every day | ORAL | 1 refills | Status: DC

## 2020-12-09 MED ORDER — AMLODIPINE BESYLATE 10 MG PO TABS: 10.0000 mg | ORAL_TABLET | Freq: Every day | ORAL | 1 refills | Status: DC

## 2020-12-09 NOTE — Assessment & Plan Note (Signed)
Chronic problem.  Tolerating statin w/o difficulty.  Check labs.  Adjust meds prn  

## 2020-12-09 NOTE — Assessment & Plan Note (Signed)
Chronic problem, on Levothyroxine 65mcg daily.  Check labs.  Adjust meds prn

## 2020-12-09 NOTE — Assessment & Plan Note (Signed)
Chronic problem.  Well controlled on Amlodipine, Atenolol, HCTZ.  Currently asymptomatic.  Check labs due to HCTZ.  Will follow.

## 2020-12-09 NOTE — Assessment & Plan Note (Signed)
Deteriorated.  Pt feels that the area is now impeding swallowing at times.  Due to multiple medical issues, pt never went for bx.  Will re-refer as an urgent in hope of getting her in quickly since it was mentioned as suspicious.  Pt expressed understanding and is in agreement w/ plan.

## 2020-12-09 NOTE — Patient Instructions (Addendum)
Schedule your complete physical in 6 months We'll notify you of your lab results and make any changes if needed We'll call you with your ENT appt Continue to work on healthy diet- you can do it! Call with any questions or concerns Stay Safe!  Stay Healthy! Hang in there!!

## 2020-12-09 NOTE — Progress Notes (Signed)
   Subjective:    Patient ID: Jamie Kelley, female    DOB: 1955-03-25, 65 y.o.   MRN: [id number]  HPI HTN- chronic problem, on Amlodipine 10mg  daily, Atenolol 50mg  daily, HCTZ 25mg  daily w/ good control.  No CP, SOB, HAs, visual changes, edema.  Hypothyroid- chronic problem, on Levothyroxine 35mcg daily.  Pt never had FNA due to multiple health issues.  Now she feels that swallowing at times is more difficult  Hyperlipidemia- chronic problem, on Simvastatin 20mg  daily.  No abd pain, N/V.   Review of Systems For ROS see HPI   This visit occurred during the SARS-CoV-2 public health emergency.  Safety protocols were in place, including screening questions prior to the visit, additional usage of staff PPE, and extensive cleaning of exam room while observing appropriate contact time as indicated for disinfecting solutions.      Objective:   Physical Exam Vitals reviewed.  Constitutional:      General: She is not in acute distress.    Appearance: Normal appearance. She is well-developed. She is not ill-appearing.  HENT:     Head: Normocephalic and atraumatic.  Eyes:     Conjunctiva/sclera: Conjunctivae normal.     Pupils: Pupils are equal, round, and reactive to light.  Neck:     Thyroid: Thyromegaly present.  Cardiovascular:     Rate and Rhythm: Normal rate and regular rhythm.     Pulses: Normal pulses.     Heart sounds: Normal heart sounds. No murmur heard. Pulmonary:     Effort: Pulmonary effort is normal. No respiratory distress.     Breath sounds: Normal breath sounds.  Abdominal:     General: There is no distension.     Palpations: Abdomen is soft.     Tenderness: There is no abdominal tenderness.  Musculoskeletal:     Cervical back: Normal range of motion and neck supple.     Right lower leg: No edema.  Lymphadenopathy:     Cervical: No cervical adenopathy.  Skin:    General: Skin is warm and dry.  Neurological:     Mental Status: She is alert and oriented to person,  place, and time.  Psychiatric:        Behavior: Behavior normal.          Assessment & Plan:

## 2020-12-20 ENCOUNTER — Other Ambulatory Visit: Payer: Self-pay | Admitting: Otolaryngology

## 2020-12-20 DIAGNOSIS — E041 Nontoxic single thyroid nodule: Secondary | ICD-10-CM

## 2020-12-21 ENCOUNTER — Other Ambulatory Visit: Payer: Self-pay | Admitting: Otolaryngology

## 2020-12-21 DIAGNOSIS — E041 Nontoxic single thyroid nodule: Secondary | ICD-10-CM

## 2020-12-27 ENCOUNTER — Ambulatory Visit
Admission: RE | Admit: 2020-12-27 | Discharge: 2020-12-27 | Disposition: A | Discharge status: Home / Self Care | Payer: No Typology Code available for payment source | Source: Ambulatory Visit | Attending: Otolaryngology | Admitting: Otolaryngology

## 2020-12-27 DIAGNOSIS — E041 Nontoxic single thyroid nodule: Secondary | ICD-10-CM

## 2021-01-04 ENCOUNTER — Ambulatory Visit: Payer: No Typology Code available for payment source | Admitting: Family Medicine

## 2021-01-04 ENCOUNTER — Encounter: Payer: Self-pay | Admitting: Family Medicine

## 2021-01-04 VITALS — BP 128/80 | HR 67 | Temp 98.0°F | Resp 18 | Ht 62.0 in | Wt 202.4 lb

## 2021-01-04 DIAGNOSIS — F4321 Adjustment disorder with depressed mood: Secondary | ICD-10-CM | POA: Diagnosis not present

## 2021-01-04 DIAGNOSIS — R21 Rash and other nonspecific skin eruption: Secondary | ICD-10-CM | POA: Diagnosis not present

## 2021-01-04 DIAGNOSIS — G4700 Insomnia, unspecified: Secondary | ICD-10-CM

## 2021-01-04 MED ORDER — TRIAMCINOLONE ACETONIDE 0.1 % EX OINT
1.0000 | TOPICAL_OINTMENT | Freq: Two times a day (BID) | CUTANEOUS | 1 refills | Status: AC
Start: 2021-01-04 — End: 2022-01-04

## 2021-01-04 MED ORDER — TRAZODONE HCL 50 MG PO TABS: 25.0000 mg | ORAL_TABLET | Freq: Every evening | ORAL | 3 refills | Status: DC | PRN

## 2021-01-04 MED ORDER — HYDROXYZINE PAMOATE 25 MG PO CAPS: 25.0000 mg | ORAL_CAPSULE | Freq: Three times a day (TID) | ORAL | 0 refills | Status: DC | PRN

## 2021-01-04 NOTE — Progress Notes (Signed)
   Subjective:    Patient ID: Jamie Kelley, female    DOB: June 18, 1955, 65 y.o.   MRN: [id number]  HPI Insomnia- trouble both falling asleep and staying asleep.  Was previously using Melatonin but this is no longer helping.  'i just watch TV bc there's nothing else to do'.  Pt has been sleeping on the couch since breaking her ankle.    Depression- PHQ=13 today.  Pt has not been working since July due to breaking her ankle.  'it's just 1 thing after the other'.  Difficulty sleeping.  Rash- sxs started on Friday w/ itching.  'my whole back was broken out'.  Took Benadryl w/ good improvement.  Back has not broken out again but upper arms and thighs will break out.  Pt reports areas appear like hives.  No changes to soap, detergents.  Rash has appeared daily since Friday.  Areas will worsen when scratched.  Stopped taking Xyzal when she started the Benadryl.   Review of Systems For ROS see HPI   This visit occurred during the SARS-CoV-2 public health emergency.  Safety protocols were in place, including screening questions prior to the visit, additional usage of staff PPE, and extensive cleaning of exam room while observing appropriate contact time as indicated for disinfecting solutions.      Objective:   Physical Exam Vitals reviewed.  Constitutional:      General: She is not in acute distress.    Appearance: Normal appearance. She is obese. She is not ill-appearing.  HENT:     Head: Normocephalic and atraumatic.  Eyes:     Extraocular Movements: Extraocular movements intact.     Conjunctiva/sclera: Conjunctivae normal.     Pupils: Pupils are equal, round, and reactive to light.  Skin:    General: Skin is warm and dry.     Comments: Small, almost hive like areas on her upper R arm and shoulder  Neurological:     General: No focal deficit present.     Mental Status: She is alert and oriented to person, place, and time.  Psychiatric:     Comments: Flat affect.  Withdrawn.           Assessment & Plan:  Insomnia- new.  Suspect this is mood related as pt is having a hard time right now.  Melatonin is no longer working and Benadryl provides minimal (maybe 1 hr) relief.  Discussed the options of treating the sleep or treating the mood and we agreed to treat the sleep first.  Will start Trazodone and monitor for improvement.  Pt expressed understanding and is in agreement w/ plan.   Adjustment disorder w/ depressed mood- new.  Pt is having a really hard time since she broke her ankle in July.  She has not been able to go back to work.  Not able to sleep in her own bed.  Feels she is relying too heavily on her boyfriend and is fearful he will get tired of it.  She is not interested in mood medication at this time but we discussed that this is likely impacting her sleep and possibly causing her hives.  Will follow.  Rash- new.  Almost hive-like in appearance.  Discussed possibility of  bites, but no one in home w/ similar areas.  Will start topical Triamcinolone for itchy, Hydroxyzine as needed for itch and anxiety, and restart daily antihistamine.  Pt expressed understanding and is in agreement w/ plan.

## 2021-01-04 NOTE — Patient Instructions (Signed)
Follow up in 3-4 weeks to recheck sleep and mood START the Trazodone nightly.  Start w/ 1/2 tab nightly and increase to 1 tab if needed Take the Hydroxyzine as needed for itching (instead of Benadryl) RESTART the daily Xyzal to help prevent the hives/itching Use the Triamcinolone ointment on the itchy areas Call with any questions or concerns Hang in there!!!

## 2021-01-11 ENCOUNTER — Other Ambulatory Visit (HOSPITAL_COMMUNITY)
Admission: RE | Admit: 2021-01-11 | Discharge: 2021-01-11 | Disposition: A | Discharge status: Home / Self Care | Payer: No Typology Code available for payment source | Source: Ambulatory Visit | Attending: Otolaryngology | Admitting: Otolaryngology

## 2021-01-11 ENCOUNTER — Other Ambulatory Visit: Payer: Self-pay

## 2021-01-11 ENCOUNTER — Ambulatory Visit
Admission: RE | Admit: 2021-01-11 | Discharge: 2021-01-11 | Disposition: A | Discharge status: Home / Self Care | Payer: No Typology Code available for payment source | Source: Ambulatory Visit | Attending: Otolaryngology | Admitting: Otolaryngology

## 2021-01-11 DIAGNOSIS — E041 Nontoxic single thyroid nodule: Secondary | ICD-10-CM | POA: Insufficient documentation

## 2021-01-12 LAB — CYTOLOGY - NON PAP

## 2021-01-17 ENCOUNTER — Other Ambulatory Visit: Payer: Self-pay | Admitting: Otolaryngology

## 2021-01-17 DIAGNOSIS — E041 Nontoxic single thyroid nodule: Secondary | ICD-10-CM

## 2021-01-25 ENCOUNTER — Ambulatory Visit: Payer: No Typology Code available for payment source | Admitting: Family Medicine

## 2021-01-25 ENCOUNTER — Encounter: Payer: Self-pay | Admitting: Family Medicine

## 2021-01-25 VITALS — BP 118/70 | HR 63 | Temp 98.1°F | Resp 16 | Wt 200.6 lb

## 2021-01-25 DIAGNOSIS — F4321 Adjustment disorder with depressed mood: Secondary | ICD-10-CM

## 2021-01-25 DIAGNOSIS — G4700 Insomnia, unspecified: Secondary | ICD-10-CM

## 2021-01-25 DIAGNOSIS — R21 Rash and other nonspecific skin eruption: Secondary | ICD-10-CM | POA: Diagnosis not present

## 2021-01-25 NOTE — Progress Notes (Signed)
   Subjective:    Patient ID: Jamie Kelley, female    DOB: 08/17/1955, 65 y.o.   MRN: [id number]  HPI Insomnia- at last visit pt was started on Trazodone.  Not currently taking b/c she feared interaction w/ Prednisone when started by Allergist.  Pt reports when she was taking Trazodone it was helpful.  Adjustment disorder- pt feels mood is better.  Easier to deal w/ things w/o hives.  Pt reports she is going back to work on Monday- which has been a huge stressor for her.  Rash- at last visit was started on Hydroxyzine and Triamcinolone.  Had to see Allergist b/c rash persisted and she developed full body hives after eating shrimp.  Also notes some correlation w/ citrus.  Currently sxs are under control.     Review of Systems For ROS see HPI   This visit occurred during the SARS-CoV-2 public health emergency.  Safety protocols were in place, including screening questions prior to the visit, additional usage of staff PPE, and extensive cleaning of exam room while observing appropriate contact time as indicated for disinfecting solutions.      Objective:   Physical Exam Vitals reviewed.  Constitutional:      General: She is not in acute distress.    Appearance: Normal appearance. She is not ill-appearing.  Eyes:     Extraocular Movements: Extraocular movements intact.     Conjunctiva/sclera: Conjunctivae normal.     Pupils: Pupils are equal, round, and reactive to light.  Skin:    General: Skin is warm and dry.  Neurological:     General: No focal deficit present.     Mental Status: She is alert and oriented to person, place, and time.  Psychiatric:        Mood and Affect: Mood normal.        Behavior: Behavior normal.        Thought Content: Thought content normal.          Assessment & Plan:   Adjustment d/o- improved.  Pt is able to return to work on Monday and has had many restrictions lifted in regards to her broken ankle.  This has greatly improved her mood and has  alleviated a lot of stress.  No need for medication at this time.  Will follow.  Insomnia- pt notes some improvement w/ Trazodone but she stopped taking it when she was prescribed Prednisone b/c she wasn't sure if she could take them together.  Reassured her that she can take this medicine as needed in the future for sleep issues.  Pt expressed understanding and is in agreement w/ plan.   Rash- pt reports she had a full blown reaction after eating shrimp and has had more mild but similar hives after eating citrus.  Her allergist gave her a steroid injxn and a prednisone prescription w/ resolution of sxs.  She has f/u upcoming to possible retest for food allergies.

## 2021-01-25 NOTE — Patient Instructions (Addendum)
Schedule your complete physical for April No need for labs today- yay!!! You can use the Trazodone for sleep as needed If you again have itching, you can use the Hydroxyzine as needed Keep up the good work!  I'm proud of you!!! GOOD LUCK W/ BACK TO WORK!!! Happy Holidays!!!

## 2021-03-15 ENCOUNTER — Other Ambulatory Visit: Payer: Self-pay | Admitting: Family Medicine

## 2021-03-15 DIAGNOSIS — Z1231 Encounter for screening mammogram for malignant neoplasm of breast: Secondary | ICD-10-CM

## 2021-03-20 ENCOUNTER — Ambulatory Visit
Admission: RE | Admit: 2021-03-20 | Discharge: 2021-03-20 | Disposition: A | Discharge status: Home / Self Care | Payer: No Typology Code available for payment source | Source: Ambulatory Visit | Attending: Family Medicine | Admitting: Family Medicine

## 2021-03-20 ENCOUNTER — Other Ambulatory Visit: Payer: Self-pay

## 2021-03-20 DIAGNOSIS — Z1231 Encounter for screening mammogram for malignant neoplasm of breast: Secondary | ICD-10-CM

## 2021-04-04 ENCOUNTER — Other Ambulatory Visit: Payer: Self-pay | Admitting: *Deleted

## 2021-04-04 ENCOUNTER — Encounter: Payer: Self-pay | Admitting: *Deleted

## 2021-04-04 DIAGNOSIS — L282 Other prurigo: Secondary | ICD-10-CM

## 2021-04-04 MED ORDER — HYDROXYZINE PAMOATE 25 MG PO CAPS: 25.0000 mg | ORAL_CAPSULE | Freq: Three times a day (TID) | ORAL | 0 refills | Status: DC | PRN

## 2021-04-04 NOTE — Telephone Encounter (Signed)
Pt requests refill of Hydroxyzine. While out on medical leave recently she developed an allergy to an unknown source and will occasionally develop generalized itching. Benadryl makes her too sleepy but hydroxyzine works well for her. Sts pcp requires a f/u in-office visit for refills typically so she is requesting a refill through clinic until she sees pcp in April. Discussed with NP Otila Kluver and approved for #30.

## 2021-04-04 NOTE — Telephone Encounter (Signed)
Patient reported she sees [facility] and follow up pending.  PCM Dr Birdie Riddle ordered for rash after eating shrimp/rash/urticaria.  Reviewed epic note and stated prn use also for anxiety Nov 2022.  Bridge refill electronic Rx sent to her pharmacy of choice today hydroxyzine 25mg  po q8h prn.  Patient thinks rash may be after shrimp products and sweeteners artificial.  Discussed with patient to avoid these products.  Patient reported she has inahlers/benadryl at home for prn use also.  Denied wheezing/trouble breathing/throat swelling.  Spoke with patient in clinic A&Ox3 no rash respirations even and unlabored RA skin warm dry and pink gait sure and steady.  Patient to schedule follow up with allergy and PCM.  [facility] handout on food allergy and anaphylactic reaction adult to patient my chart.  Patient verbalized understanding information/instructions, agreed with plan of care and had no further questions at this time.

## 2021-06-09 ENCOUNTER — Encounter: Payer: No Typology Code available for payment source | Admitting: Family Medicine

## 2021-06-16 ENCOUNTER — Encounter: Payer: Self-pay | Admitting: Family Medicine

## 2021-06-16 ENCOUNTER — Ambulatory Visit (INDEPENDENT_AMBULATORY_CARE_PROVIDER_SITE_OTHER): Payer: No Typology Code available for payment source | Admitting: Family Medicine

## 2021-06-16 VITALS — BP 114/82 | HR 59 | Temp 98.1°F | Resp 16 | Ht 61.5 in | Wt 192.4 lb

## 2021-06-16 DIAGNOSIS — Z0000 Encounter for general adult medical examination without abnormal findings: Secondary | ICD-10-CM

## 2021-06-16 DIAGNOSIS — Z1159 Encounter for screening for other viral diseases: Secondary | ICD-10-CM

## 2021-06-16 DIAGNOSIS — F330 Major depressive disorder, recurrent, mild: Secondary | ICD-10-CM

## 2021-06-16 DIAGNOSIS — E559 Vitamin D deficiency, unspecified: Secondary | ICD-10-CM | POA: Diagnosis not present

## 2021-06-16 LAB — CBC WITH DIFFERENTIAL/PLATELET
Basophils Absolute: 0 10*3/uL (ref 0.0–0.1)
Basophils Relative: 0.4 % (ref 0.0–3.0)
Eosinophils Absolute: 0.1 10*3/uL (ref 0.0–0.7)
Eosinophils Relative: 1.4 % (ref 0.0–5.0)
HCT: 39.2 % (ref 36.0–46.0)
Hemoglobin: 13 g/dL (ref 12.0–15.0)
Lymphocytes Relative: 32.5 % (ref 12.0–46.0)
Lymphs Abs: 2.2 10*3/uL (ref 0.7–4.0)
MCHC: 33.2 g/dL (ref 30.0–36.0)
MCV: 90.1 fl (ref 78.0–100.0)
Monocytes Absolute: 0.4 10*3/uL (ref 0.1–1.0)
Monocytes Relative: 6.6 % (ref 3.0–12.0)
Neutro Abs: 4 10*3/uL (ref 1.4–7.7)
Neutrophils Relative %: 59.1 % (ref 43.0–77.0)
Platelets: 202 10*3/uL (ref 150.0–400.0)
RBC: 4.35 Mil/uL (ref 3.87–5.11)
RDW: 14.6 % (ref 11.5–15.5)
WBC: 6.8 10*3/uL (ref 4.0–10.5)

## 2021-06-16 LAB — HEPATIC FUNCTION PANEL
ALT: 11 U/L (ref 0–35)
AST: 17 U/L (ref 0–37)
Albumin: 4.5 g/dL (ref 3.5–5.2)
Alkaline Phosphatase: 92 U/L (ref 39–117)
Bilirubin, Direct: 0.1 mg/dL (ref 0.0–0.3)
Total Bilirubin: 0.7 mg/dL (ref 0.2–1.2)
Total Protein: 7.4 g/dL (ref 6.0–8.3)

## 2021-06-16 LAB — TSH: TSH: 0.88 u[IU]/mL (ref 0.35–5.50)

## 2021-06-16 LAB — LIPID PANEL
Cholesterol: 178 mg/dL (ref 0–200)
HDL: 62 mg/dL (ref >39.00)
LDL Cholesterol: 101 mg/dL — ABNORMAL HIGH (ref 0–99)
NonHDL: 116.07
Total CHOL/HDL Ratio: 3
Triglycerides: 73 mg/dL (ref 0.0–149.0)
VLDL: 14.6 mg/dL (ref 0.0–40.0)

## 2021-06-16 LAB — BASIC METABOLIC PANEL
BUN: 11 mg/dL (ref 6–23)
CO2: 23 mEq/L (ref 19–32)
Calcium: 9.5 mg/dL (ref 8.4–10.5)
Chloride: 103 mEq/L (ref 96–112)
Creatinine, Ser: 0.6 mg/dL (ref 0.40–1.20)
GFR: 95.24 mL/min (ref >60.00)
Glucose, Bld: 77 mg/dL (ref 70–99)
Potassium: 4 mEq/L (ref 3.5–5.1)
Sodium: 140 mEq/L (ref 135–145)

## 2021-06-16 LAB — VITAMIN D 25 HYDROXY (VIT D DEFICIENCY, FRACTURES): VITD: 19.49 ng/mL — ABNORMAL LOW (ref 30.00–100.00)

## 2021-06-16 NOTE — Patient Instructions (Signed)
Follow up in 6 months to recheck BP and cholesterol- sooner if needed ?We'll notify you of your lab results and make any changes if needed ?Keep up the good work on healthy diet and regular exercise- you look great!!! ?Call with any questions or concerns ?Stay Safe!  Stay Healthy! ?Happy Spring!!! ?

## 2021-06-16 NOTE — Assessment & Plan Note (Signed)
Improving.  Pt is down 9 lbs since last visit.  Applauded her efforts and encouraged her to continue working on healthy diet and regular exercise.  Will follow. ?

## 2021-06-16 NOTE — Progress Notes (Signed)
? ?  Subjective:  ? ? Patient ID: Jamie Kelley, female    DOB: 12/28/1955, 66 y.o.   MRN: [id number] ? ?HPI ?CPE- UTD on mammo, cologuard, Tdap, flu.  Continues to decline pap ? ?Health Maintenance  ?Topic Date Due  ? Hepatitis C Screening  Never done  ? PAP SMEAR-Modifier  Never done  ? Zoster Vaccines- Shingrix (1 of 2) Never done  ? COVID-19 Vaccine (5 - Booster for Pfizer series) 03/15/2021  ? INFLUENZA VACCINE  09/26/2021  ? Fecal DNA (Cologuard)  07/06/2022  ? MAMMOGRAM  03/21/2023  ? TETANUS/TDAP  06/10/2030  ? HIV Screening  Completed  ? HPV VACCINES  Aged Out  ?  ? ? ?Review of Systems ?Patient reports no vision/ hearing changes, adenopathy,fever, persistant/recurrent hoarseness , swallowing issues, chest pain, palpitations, edema, persistant/recurrent cough, hemoptysis, dyspnea (rest/exertional/paroxysmal nocturnal), gastrointestinal bleeding (melena, rectal bleeding), abdominal pain, significant heartburn, bowel changes, GU symptoms (dysuria, hematuria, incontinence), Gyn symptoms (abnormal  bleeding, pain),  syncope, focal weakness, memory loss, numbness & tingling, skin/hair/nail changes, abnormal bruising or bleeding, anxiety, or depression.  ? ?+ 9 lb weight loss- pt has been eliminating soda and juice ?   ?Objective:  ? Physical Exam ?General Appearance:    Alert, cooperative, no distress, appears stated age  ?Head:    Normocephalic, without obvious abnormality, atraumatic  ?Eyes:    PERRL, conjunctiva/corneas clear, EOM's intact both eyes  ?Ears:    Normal TM's and external ear canals, both ears  ?Nose:   Nares normal, septum midline, mucosa normal, no drainage  ?  or sinus tenderness  ?Throat:   Lips, mucosa, and tongue normal; teeth and gums normal  ?Neck:   Supple, symmetrical, trachea midline, no adenopathy;  ?  Thyroid: multinodular goiter  ?Back:     Symmetric, no curvature, ROM normal, no CVA tenderness  ?Lungs:     Clear to auscultation bilaterally, respirations unlabored  ?Chest Wall:    No  tenderness or deformity  ? Heart:    Regular rate and rhythm, S1 and S2 normal, no murmur, rub ?  or gallop  ?Breast Exam:    Deferred to mammo  ?Abdomen:     Soft, non-tender, bowel sounds active all four quadrants,  ?  no masses, no organomegaly  ?Genitalia:    Deferred  ?Rectal:    ?Extremities:   Extremities normal, atraumatic, no cyanosis or edema  ?Pulses:   2+ and symmetric all extremities  ?Skin:   Skin color, texture, turgor normal, no rashes or lesions  ?Lymph nodes:   Cervical, supraclavicular, and axillary nodes normal  ?Neurologic:   CNII-XII intact, normal strength, sensation and reflexes  ?  throughout  ?  ? ? ? ?   ?Assessment & Plan:  ? ? ?

## 2021-06-16 NOTE — Assessment & Plan Note (Signed)
Currently in remission 

## 2021-06-16 NOTE — Assessment & Plan Note (Signed)
Check labs and replete prn. 

## 2021-06-19 LAB — HEPATITIS C ANTIBODY
Hepatitis C Ab: NONREACTIVE
SIGNAL TO CUT-OFF: 0.08 (ref <1.00)

## 2021-06-20 ENCOUNTER — Telehealth: Payer: Self-pay

## 2021-06-20 ENCOUNTER — Telehealth: Payer: Self-pay | Admitting: *Deleted

## 2021-06-20 MED ORDER — AMLODIPINE BESYLATE 10 MG PO TABS: 10.0000 mg | ORAL_TABLET | Freq: Every day | ORAL | 1 refills | Status: DC

## 2021-06-20 MED ORDER — VITAMIN D (ERGOCALCIFEROL) 1.25 MG (50000 UNIT) PO CAPS: 50000.0000 [IU] | ORAL_CAPSULE | ORAL | 0 refills | Status: DC

## 2021-06-20 MED ORDER — LEVOTHYROXINE SODIUM 50 MCG PO TABS
ORAL_TABLET | ORAL | 1 refills | Status: DC
Start: 2021-06-20 — End: 2023-01-04

## 2021-06-20 MED ORDER — SIMVASTATIN 20 MG PO TABS: 20.0000 mg | ORAL_TABLET | Freq: Every day | ORAL | 1 refills | Status: DC

## 2021-06-20 MED ORDER — ATENOLOL 50 MG PO TABS: 100.0000 mg | ORAL_TABLET | Freq: Every day | ORAL | 1 refills | Status: DC

## 2021-06-20 NOTE — Telephone Encounter (Signed)
-----   Message from Midge Minium, MD sent at 06/19/2021  7:22 AM EDT ----- ?Labs look great w/ exception of low Vit D.  Based on this, we need to start prescription 50,000 units weekly x12 weeks in addition to daily OTC supplement of at least 2000 units.  ?

## 2021-06-20 NOTE — Telephone Encounter (Signed)
Patient aware of labs and vitamin d rx ?

## 2021-06-20 NOTE — Telephone Encounter (Signed)
Good afternoon. Chriselda was seen by Dr. Birdie Riddle on 06/16/21. She was advised to continue all of her meds but I needed to ask for her Rx's to be entered in [facility] so we can fill them for her here onsite at her workplace at no cost to her.  ?Could you please enter the following under [facility] [place]? It will default to "Print" instead of e-Rx, but I do not need the hard copies.  ?Amlodipine '10mg'$  daily ?Atenolol '50mg'$  2 tabs daily ?Levothyroxine 51mg 1/2 tab daily ?Simvastatin '20mg'$  daily ? ?Thank you! ?HHildred AlaminRN ?

## 2021-06-20 NOTE — Telephone Encounter (Signed)
Prescriptions were entered as requested ?

## 2021-06-21 ENCOUNTER — Telehealth: Payer: Self-pay | Admitting: Family Medicine

## 2021-06-21 MED ORDER — HYDROXYZINE PAMOATE 25 MG PO CAPS
25.0000 mg | ORAL_CAPSULE | Freq: Three times a day (TID) | ORAL | 0 refills | Status: DC | PRN
Start: 2021-06-21 — End: 2021-11-16

## 2021-06-21 NOTE — Telephone Encounter (Signed)
Pt called in asking for a refill on the hydroxyzine '25mg'$ , pt uses [facility] on [address]  ?

## 2021-11-16 ENCOUNTER — Encounter: Payer: Self-pay | Admitting: Registered Nurse

## 2021-11-16 ENCOUNTER — Telehealth: Payer: Self-pay | Admitting: Registered Nurse

## 2021-11-16 DIAGNOSIS — R059 Cough, unspecified: Secondary | ICD-10-CM | POA: Insufficient documentation

## 2021-11-16 DIAGNOSIS — J309 Allergic rhinitis, unspecified: Secondary | ICD-10-CM | POA: Insufficient documentation

## 2021-11-16 DIAGNOSIS — I10 Essential (primary) hypertension: Secondary | ICD-10-CM

## 2021-11-16 DIAGNOSIS — E038 Other specified hypothyroidism: Secondary | ICD-10-CM

## 2021-11-16 DIAGNOSIS — L501 Idiopathic urticaria: Secondary | ICD-10-CM | POA: Insufficient documentation

## 2021-11-16 DIAGNOSIS — H1045 Other chronic allergic conjunctivitis: Secondary | ICD-10-CM | POA: Insufficient documentation

## 2021-11-16 NOTE — Telephone Encounter (Signed)
Contacted patient via telephone.  Will run out of levothyroxine 44mg take 1[facility]2 tab [facility] '[facility]'$  take 2 tabs po daily.  Typically picks up from [facility]  Last fill per paper chart 08/08/21 for levothyroxine 557m take 1/2 tab po daily #60 and atenolol '50mg'$  take 2 daily po #180.   Last labs below.  Patient asked that I put her meds on her desk at work today.  Latest Reference Range & Units 06/16/21 09:24  Sodium 135 - 145 mEq/L 140  Potassium 3.5 - 5.1 mEq/L 4.0  Chloride 96 - 112 mEq/L 103  CO2 19 - 32 mEq/L 23  Glucose 70 - 99 mg/dL 77  BUN 6 - 23 mg/dL 11  Creatinine 0.40 - 1.20 mg/dL 0.60  Calcium 8.4 - 10.5 mg/dL 9.5  Alkaline Phosphatase 39 - 117 U/L 92  Albumin 3.5 - 5.2 g/dL 4.5  AST 0 - 37 U/L 17  ALT 0 - 35 U/L 11  Total Protein 6.0 - 8.3 g/dL 7.4  Bilirubin, Direct 0.0 - 0.3 mg/dL 0.1  Total Bilirubin 0.2 - 1.2 mg/dL 0.7  GFR >60.00 mL/min 95.24  Total CHOL/HDL Ratio  3  Cholesterol 0 - 200 mg/dL 178  HDL Cholesterol >39.00 mg/dL 62.00  LDL (calc) 0 - 99 mg/dL 101 (H)  NonHDL  116.07  Triglycerides 0.0 - 149.0 mg/dL 73.0  VLDL 0.0 - 40.0 mg/dL 14.6  VITD 30.00 - 100.00 ng/mL 19.49 (L)  WBC 4.0 - 10.5 K/uL 6.8  RBC 3.87 - 5.11 Mil/uL 4.35  Hemoglobin 12.0 - 15.0 g/dL 13.0  HCT 36.0 - 46.0 % 39.2  MCV 78.0 - 100.0 fl 90.1  MCHC 30.0 - 36.0 g/dL 33.2  RDW 11.5 - 15.5 % 14.6  Platelets 150.0 - 400.0 K/uL 202.0  Neutrophils 43.0 - 77.0 % 59.1  Lymphocytes 12.0 - 46.0 % 32.5  Monocytes Relative 3.0 - 12.0 % 6.6  Eosinophil 0.0 - 5.0 % 1.4  Basophil 0.0 - 3.0 % 0.4  NEUT# 1.4 - 7.7 K/uL 4.0  Lymphocyte # 0.7 - 4.0 K/uL 2.2  Monocyte # 0.1 - 1.0 K/uL 0.4  Eosinophils Absolute 0.0 - 0.7 K/uL 0.1  Basophils Absolute 0.0 - 0.1 K/uL 0.0  TSH 0.35 - 5.50 uIU/mL 0.88  (H): Data is abnormally high (L): Data is abnormally low  Patient notified will need new Rx for next fills.  Patient verbalized understanding  information/instructions, agreed with plan of care and had no further questions at this time.

## 2021-11-16 NOTE — Telephone Encounter (Signed)
-----   Message from Darletta Moll, RN sent at 11/14/2021 10:48 AM EDT ----- Regarding: Maximiano Coss refill Ladean Raya left message requesting a refill of her Atenalol.  She is not at work today.  States she has an appt with her PCP in a month.

## 2021-12-19 ENCOUNTER — Ambulatory Visit: Payer: No Typology Code available for payment source | Admitting: Family Medicine

## 2021-12-19 ENCOUNTER — Encounter: Payer: Self-pay | Admitting: Family Medicine

## 2021-12-19 VITALS — BP 128/64 | HR 60 | Temp 97.9°F | Resp 16 | Ht 61.0 in | Wt 192.0 lb

## 2021-12-19 DIAGNOSIS — E038 Other specified hypothyroidism: Secondary | ICD-10-CM | POA: Diagnosis not present

## 2021-12-19 DIAGNOSIS — Z23 Encounter for immunization: Secondary | ICD-10-CM | POA: Diagnosis not present

## 2021-12-19 DIAGNOSIS — E041 Nontoxic single thyroid nodule: Secondary | ICD-10-CM

## 2021-12-19 DIAGNOSIS — E785 Hyperlipidemia, unspecified: Secondary | ICD-10-CM

## 2021-12-19 DIAGNOSIS — I10 Essential (primary) hypertension: Secondary | ICD-10-CM | POA: Diagnosis not present

## 2021-12-19 LAB — HEPATIC FUNCTION PANEL
ALT: 11 U/L (ref 0–35)
AST: 18 U/L (ref 0–37)
Albumin: 4.3 g/dL (ref 3.5–5.2)
Alkaline Phosphatase: 74 U/L (ref 39–117)
Bilirubin, Direct: 0.1 mg/dL (ref 0.0–0.3)
Total Bilirubin: 0.5 mg/dL (ref 0.2–1.2)
Total Protein: 7.3 g/dL (ref 6.0–8.3)

## 2021-12-19 LAB — CBC WITH DIFFERENTIAL/PLATELET
Basophils Absolute: 0 10*3/uL (ref 0.0–0.1)
Basophils Relative: 0.4 % (ref 0.0–3.0)
Eosinophils Absolute: 0.1 10*3/uL (ref 0.0–0.7)
Eosinophils Relative: 1.9 % (ref 0.0–5.0)
HCT: 40.3 % (ref 36.0–46.0)
Hemoglobin: 13.3 g/dL (ref 12.0–15.0)
Lymphocytes Relative: 35.8 % (ref 12.0–46.0)
Lymphs Abs: 2.2 10*3/uL (ref 0.7–4.0)
MCHC: 32.9 g/dL (ref 30.0–36.0)
MCV: 88.9 fl (ref 78.0–100.0)
Monocytes Absolute: 0.4 10*3/uL (ref 0.1–1.0)
Monocytes Relative: 7 % (ref 3.0–12.0)
Neutro Abs: 3.4 10*3/uL (ref 1.4–7.7)
Neutrophils Relative %: 54.9 % (ref 43.0–77.0)
Platelets: 185 10*3/uL (ref 150.0–400.0)
RBC: 4.53 Mil/uL (ref 3.87–5.11)
RDW: 14.4 % (ref 11.5–15.5)
WBC: 6.2 10*3/uL (ref 4.0–10.5)

## 2021-12-19 LAB — BASIC METABOLIC PANEL
BUN: 11 mg/dL (ref 6–23)
CO2: 29 mEq/L (ref 19–32)
Calcium: 9.2 mg/dL (ref 8.4–10.5)
Chloride: 104 mEq/L (ref 96–112)
Creatinine, Ser: 0.58 mg/dL (ref 0.40–1.20)
GFR: 95.68 mL/min (ref >60.00)
Glucose, Bld: 93 mg/dL (ref 70–99)
Potassium: 3.8 mEq/L (ref 3.5–5.1)
Sodium: 139 mEq/L (ref 135–145)

## 2021-12-19 LAB — LIPID PANEL
Cholesterol: 183 mg/dL (ref 0–200)
HDL: 63.3 mg/dL (ref >39.00)
LDL Cholesterol: 102 mg/dL — ABNORMAL HIGH (ref 0–99)
NonHDL: 119.91
Total CHOL/HDL Ratio: 3
Triglycerides: 92 mg/dL (ref 0.0–149.0)
VLDL: 18.4 mg/dL (ref 0.0–40.0)

## 2021-12-19 LAB — TSH: TSH: 1.27 u[IU]/mL (ref 0.35–5.50)

## 2021-12-19 NOTE — Progress Notes (Signed)
   Subjective:    Patient ID: Jamie Kelley, female    DOB: [birth year], 66 y.o.   MRN: [id number]  HPI HTN- chronic problem, on Atenolol '100mg'$  daily and Amlodipine '10mg'$  daily w/ adequate control.  No CP, SOB, HAs, visual changes, edema  Hypothyroid- chronic problem, on 26mg daily.  No changes to skin/hair/nails.  Energy level tends to fluctuate.  Hyperlipidemia- chronic problem, on Simvastatin '20mg'$  daily.  No abd pain, N/V  Obesity- weight is stable at 192, BMI 36.28  This qualifies as morbid obesity due to BP and cholesterol issues.     Review of Systems For ROS see HPI     Objective:   Physical Exam Vitals reviewed.  Constitutional:      General: She is not in acute distress.    Appearance: Normal appearance. She is well-developed. She is obese. She is not ill-appearing.  HENT:     Head: Normocephalic and atraumatic.  Eyes:     Conjunctiva/sclera: Conjunctivae normal.     Pupils: Pupils are equal, round, and reactive to light.  Neck:     Thyroid: Thyroid mass and thyromegaly present.  Cardiovascular:     Rate and Rhythm: Normal rate and regular rhythm.     Heart sounds: Normal heart sounds. No murmur heard. Pulmonary:     Effort: Pulmonary effort is normal. No respiratory distress.     Breath sounds: Normal breath sounds.  Abdominal:     General: There is no distension.     Palpations: Abdomen is soft.     Tenderness: There is no abdominal tenderness.  Musculoskeletal:     Cervical back: Normal range of motion and neck supple.  Lymphadenopathy:     Cervical: No cervical adenopathy.  Skin:    General: Skin is warm and dry.  Neurological:     Mental Status: She is alert and oriented to person, place, and time.  Psychiatric:        Behavior: Behavior normal.           Assessment & Plan:

## 2021-12-19 NOTE — Assessment & Plan Note (Signed)
Ongoing issue.  Weight and BMI are stable.  Encouraged low carb diet and regular physical activity.  Will follow.

## 2021-12-19 NOTE — Assessment & Plan Note (Signed)
Chronic problem.  Tolerating Simvastatin 20mg daily w/o difficulty.  Check labs.  Adjust meds prn    

## 2021-12-19 NOTE — Assessment & Plan Note (Signed)
Encouraged her to f/u w/ Dr Redmond Baseman.  His contact info was provided

## 2021-12-19 NOTE — Assessment & Plan Note (Signed)
Chronic problem.  Currently asymptomatic.  Check labs.  Adjust meds prn  

## 2021-12-19 NOTE — Patient Instructions (Signed)
Schedule your complete physical in 6 months We'll notify you of your lab results and make any changes if needed Continue to work on healthy diet and regular exercise- you can do it! Call Dr Redmond Baseman at [facility] to follow up on the thyroid nodule [phone] Call with any questions or concerns Stay safe! Stay healthy! Happy Fall!!!

## 2021-12-19 NOTE — Assessment & Plan Note (Signed)
Chronic problem.  Currently well controlled on Atenolol '100mg'$  daily and Amlodipine '10mg'$  daily.  Asymptomatic at this time.  Will continue to follow.

## 2021-12-20 NOTE — Progress Notes (Signed)
Informed pf of lab results  

## 2022-01-02 ENCOUNTER — Other Ambulatory Visit: Payer: Self-pay | Admitting: Family Medicine

## 2022-01-02 MED ORDER — AMLODIPINE BESYLATE 10 MG PO TABS: 10.0000 mg | ORAL_TABLET | Freq: Every day | ORAL | 1 refills | Status: DC

## 2022-01-02 NOTE — Progress Notes (Signed)
Prescription sent to preferred pharmacy

## 2022-03-06 ENCOUNTER — Telehealth: Payer: Self-pay | Admitting: Registered Nurse

## 2022-03-06 ENCOUNTER — Encounter: Payer: Self-pay | Admitting: Registered Nurse

## 2022-03-06 DIAGNOSIS — I10 Essential (primary) hypertension: Secondary | ICD-10-CM

## 2022-03-06 DIAGNOSIS — E7800 Pure hypercholesterolemia, unspecified: Secondary | ICD-10-CM

## 2022-03-06 NOTE — Telephone Encounter (Signed)
Latest Reference Range & Units 06/16/21 09:24 12/19/21 09:39  Sodium 135 - 145 mEq/L 140 139  Potassium 3.5 - 5.1 mEq/L 4.0 3.8  Chloride 96 - 112 mEq/L 103 104  CO2 19 - 32 mEq/L 23 29  Glucose 70 - 99 mg/dL 77 93  BUN 6 - 23 mg/dL 11 11  Creatinine 0.40 - 1.20 mg/dL 0.60 0.58  Calcium 8.4 - 10.5 mg/dL 9.5 9.2  Alkaline Phosphatase 39 - 117 U/L 92 74  Albumin 3.5 - 5.2 g/dL 4.5 4.3  AST 0 - 37 U/L 17 18  ALT 0 - 35 U/L 11 11  Total Protein 6.0 - 8.3 g/dL 7.4 7.3  Bilirubin, Direct 0.0 - 0.3 mg/dL 0.1 0.1  Total Bilirubin 0.2 - 1.2 mg/dL 0.7 0.5  GFR >60.00 mL/min 95.24 95.68  Total CHOL/HDL Ratio  3 3  Cholesterol 0 - 200 mg/dL 178 183  HDL Cholesterol >39.00 mg/dL 62.00 63.30  LDL (calc) 0 - 99 mg/dL 101 (H) 102 (H)  NonHDL  116.07 119.91  Triglycerides 0.0 - 149.0 mg/dL 73.0 92.0  VLDL 0.0 - 40.0 mg/dL 14.6 18.4  VITD 30.00 - 100.00 ng/mL 19.49 (L)   WBC 4.0 - 10.5 K/uL 6.8 6.2  RBC 3.87 - 5.11 Mil/uL 4.35 4.53  Hemoglobin 12.0 - 15.0 g/dL 13.0 13.3  HCT 36.0 - 46.0 % 39.2 40.3  MCV 78.0 - 100.0 fl 90.1 88.9  MCHC 30.0 - 36.0 g/dL 33.2 32.9  RDW 11.5 - 15.5 % 14.6 14.4  Platelets 150.0 - 400.0 K/uL 202.0 185.0  Neutrophils 43.0 - 77.0 % 59.1 54.9  Lymphocytes 12.0 - 46.0 % 32.5 35.8  Monocytes Relative 3.0 - 12.0 % 6.6 7.0  Eosinophil 0.0 - 5.0 % 1.4 1.9  Basophil 0.0 - 3.0 % 0.4 0.4  NEUT# 1.4 - 7.7 K/uL 4.0 3.4  Lymphocyte # 0.7 - 4.0 K/uL 2.2 2.2  Monocyte # 0.1 - 1.0 K/uL 0.4 0.4  Eosinophils Absolute 0.0 - 0.7 K/uL 0.1 0.1  Basophils Absolute 0.0 - 0.1 K/uL 0.0 0.0  TSH 0.35 - 5.50 uIU/mL 0.88 1.27  (H): Data is abnormally high (L): Data is abnormally low  Last fill Sep 2023.  Labs 12/19/21.  BP stable at [facility] appt 12/19/21 128/64 HR 60 Renewed Rx atenolol '50mg'$  sig t2 po daily #180 RF3 and simvastatin '20mg'$  po daily #90 RF3.  Next labs Oct 2024.

## 2022-03-14 LAB — HM MAMMOGRAPHY

## 2022-03-21 ENCOUNTER — Encounter: Payer: Self-pay | Admitting: Family Medicine

## 2022-04-17 ENCOUNTER — Ambulatory Visit: Payer: Self-pay | Admitting: Occupational Medicine

## 2022-04-17 ENCOUNTER — Encounter: Payer: Self-pay | Admitting: Occupational Medicine

## 2022-04-17 ENCOUNTER — Telehealth: Payer: Self-pay | Admitting: Registered Nurse

## 2022-04-17 ENCOUNTER — Encounter: Payer: Self-pay | Admitting: Registered Nurse

## 2022-04-17 DIAGNOSIS — I10 Essential (primary) hypertension: Secondary | ICD-10-CM

## 2022-04-17 DIAGNOSIS — Z0000 Encounter for general adult medical examination without abnormal findings: Secondary | ICD-10-CM

## 2022-04-17 MED ORDER — AMLODIPINE BESYLATE 5 MG PO TABS: 10.0000 mg | ORAL_TABLET | Freq: Every day | ORAL | 0 refills | Status: DC

## 2022-04-17 NOTE — Telephone Encounter (Signed)
Patient requested [facility] refill amlodipine 53m po daily.  Currently out of stock.  Filled with amlodipine 538mtake 2 tabs po daily #90 RF0 dispensed to patient today.  Last BP 12/19/21 with PCM 128/64 HR 60 stable.   Latest Reference Range & Units 12/19/21 09:39  Sodium 135 - 145 mEq/L 139  Potassium 3.5 - 5.1 mEq/L 3.8  Chloride 96 - 112 mEq/L 104  CO2 19 - 32 mEq/L 29  Glucose 70 - 99 mg/dL 93  BUN 6 - 23 mg/dL 11  Creatinine 0.40 - 1.20 mg/dL 0.58  Calcium 8.4 - 10.5 mg/dL 9.2  Alkaline Phosphatase 39 - 117 U/L 74  Albumin 3.5 - 5.2 g/dL 4.3  AST 0 - 37 U/L 18  ALT 0 - 35 U/L 11  Total Protein 6.0 - 8.3 g/dL 7.3  Bilirubin, Direct 0.0 - 0.3 mg/dL 0.1  Total Bilirubin 0.2 - 1.2 mg/dL 0.5  GFR >60.00 mL/min 95.68  Total CHOL/HDL Ratio  3  Cholesterol 0 - 200 mg/dL 183  HDL Cholesterol >39.00 mg/dL 63.30  LDL (calc) 0 - 99 mg/dL 102 (H)  NonHDL  119.91  Triglycerides 0.0 - 149.0 mg/dL 92.0  VLDL 0.0 - 40.0 mg/dL 18.4  WBC 4.0 - 10.5 K/uL 6.2  RBC 3.87 - 5.11 Mil/uL 4.53  Hemoglobin 12.0 - 15.0 g/dL 13.3  HCT 36.0 - 46.0 % 40.3  MCV 78.0 - 100.0 fl 88.9  MCHC 30.0 - 36.0 g/dL 32.9  RDW 11.5 - 15.5 % 14.4  Platelets 150.0 - 400.0 K/uL 185.0  Neutrophils 43.0 - 77.0 % 54.9  Lymphocytes 12.0 - 46.0 % 35.8  Monocytes Relative 3.0 - 12.0 % 7.0  Eosinophil 0.0 - 5.0 % 1.9  Basophil 0.0 - 3.0 % 0.4  NEUT# 1.4 - 7.7 K/uL 3.4  Lymphocyte # 0.7 - 4.0 K/uL 2.2  Monocyte # 0.1 - 1.0 K/uL 0.4  Eosinophils Absolute 0.0 - 0.7 K/uL 0.1  Basophils Absolute 0.0 - 0.1 K/uL 0.0  (H): Data is abnormally high  Patient met 2025 Be Well requirements with PCM just needs to sign paperwork RN Kimrey notified BP and LDL met requirements no A1c drawn.

## 2022-04-17 NOTE — Progress Notes (Signed)
Be well insurance premium discount evaluation:    Patient completed PCM office visit epic reviewed by RN Evlyn Kanner and transcribed. Labs  Tobacco attestation signed. [facility] ROI formed signed. Forms placed in the chart.   Patient given handouts for [facility] pharmacies and discount drugs list,MyChart, Tele doc setup, Tele doc Behavioral, [place] counseling and [facility] counseling.  What to do for infectious illness protocol. Given handout for list of medications that can be filled at [facility]. Given Clinic hours and Clinic Email.

## 2022-05-10 ENCOUNTER — Encounter: Payer: Self-pay | Admitting: Registered Nurse

## 2022-05-10 ENCOUNTER — Ambulatory Visit: Payer: Self-pay | Admitting: Registered Nurse

## 2022-05-10 VITALS — BP 122/80 | HR 64

## 2022-05-10 DIAGNOSIS — M62830 Muscle spasm of back: Secondary | ICD-10-CM

## 2022-05-10 MED ORDER — ACETAMINOPHEN 500 MG PO TABS: 1000.0000 mg | ORAL_TABLET | Freq: Four times a day (QID) | ORAL | 0 refills | Status: AC | PRN

## 2022-05-10 MED ORDER — CYCLOBENZAPRINE HCL 10 MG PO TABS: 10.0000 mg | ORAL_TABLET | Freq: Every evening | ORAL | 0 refills | Status: AC | PRN

## 2022-05-10 MED ORDER — BIOFREEZE 4 % EX GEL: 1.0000 | Freq: Four times a day (QID) | CUTANEOUS | 0 refills | Status: AC | PRN

## 2022-05-10 MED ORDER — IBUPROFEN 800 MG PO TABS: 800.0000 mg | ORAL_TABLET | Freq: Three times a day (TID) | ORAL | 0 refills | Status: DC

## 2022-05-10 NOTE — Patient Instructions (Addendum)
Muscle Cramps and Spasms Muscle cramps and spasms occur when a muscle or muscles tighten and you have no control over this tightening (involuntary muscle contraction). They are a common problem that can happen in any muscle. The most common place is in the calf muscles of the leg. There are a few ways that muscle cramps and spasms differ: Muscle cramps are painful. They come and go and may last for a few seconds or up to 15 minutes. Muscle cramps are often more forceful and last longer than muscle spasms. Muscle spasms may or may not be painful. They may last just a few seconds or last much longer. Certain conditions, such as diabetes or Parkinson's disease, can make you more likely to have cramps or spasms. But in most cases, cramps and spasms are not caused by other conditions. Common causes include: Overexertion. This is when you do more physical work or exercise than your body is ready for. Overuse from doing the same movements too many times. Staying in one position for too long. Improper preparation, form, or technique when playing a sport or doing an activity. Not enough water or other fluids in your body (dehydration). Other causes may include: Injury. Side effects of some medicines. Too few salts and minerals in your body (electrolytes), such as potassium and calcium. This could happen if you are taking water pills (diuretics) or if you are pregnant. In many cases, the cause of muscle cramps or spasms is not known. Follow these instructions at home: Eating and drinking Drink enough fluid to keep your pee (urine) pale yellow. This can help prevent cramps or spasms. Eat a healthy diet that includes a lot of nutrients to help your muscles work. A healthy diet includes fruits and vegetables, lean protein, whole grains, and low-fat or nonfat dairy products. Managing pain and stiffness     Try to massage, stretch, and relax the affected muscle. Do this for a few minutes at a time. If told,  put ice on the muscles. This may help if you are sore or have pain after a cramp or spasm. Put ice in a plastic bag. Place a towel between your skin and the bag. Leave the ice on for 20 minutes, 2-3 times a day. If told, apply heat to tight or tense muscles as often as told by your health care provider. Use the heat source that your provider recommends, such as a moist heat pack or a heating pad. Place a towel between your skin and the heat source. Leave the heat on for 20-30 minutes. If your skin turns bright red, remove the ice or heat right away to prevent skin damage. The risk of damage is higher if you cannot feel pain, heat, or cold. Take hot showers or baths to help relax tight muscles. General instructions If you are having cramps often, avoid intense exercise for a few days. Take over-the-counter and prescription medicines only as told by your provider. Watch for any changes in your symptoms. Contact a health care provider if: Your cramps or spasms get more severe or happen more often. Your cramps or spasms do not get better over time. This information is not intended to replace advice given to you by your health care provider. Make sure you discuss any questions you have with your health care provider. Document Revised: 10/03/2021 Document Reviewed: 10/03/2021 Elsevier Patient Education  [place] or Strain Rehab Ask your health care provider which exercises are safe for you. Do exercises  exactly as told by your health care provider and adjust them as directed. It is normal to feel mild stretching, pulling, tightness, or discomfort as you do these exercises. Stop right away if you feel sudden pain or your pain gets worse. Do not begin these exercises until told by your health care provider. Stretching and range-of-motion exercises These exercises warm up your muscles and joints and improve the movement and flexibility of your back. These exercises also help  to relieve pain, numbness, and tingling. Lumbar rotation  Lie on your back on a firm bed or the floor with your knees bent. Straighten your arms out to your sides so each arm forms a 90-degree angle (right angle) with a side of your body. Slowly move (rotate) both of your knees to one side of your body until you feel a stretch in your lower back (lumbar). Try not to let your shoulders lift off the floor. Hold this position for ___30_______ seconds. Tense your abdominal muscles and slowly move your knees back to the starting position. Repeat this exercise on the other side of your body. Repeat ___3_______ times. Complete this exercise _____2_____ times a day. Single knee to chest  Lie on your back on a firm bed or the floor with both legs straight. Bend one of your knees. Use your hands to move your knee up toward your chest until you feel a gentle stretch in your lower back and buttock. Hold your leg in this position by holding on to the front of your knee. Keep your other leg as straight as possible. Hold this position for ___30_______ seconds. Slowly return to the starting position. Repeat with your other leg. Repeat ____3______ times. Complete this exercise _____2_____ times a day. Prone extension on elbows  Lie on your abdomen on a firm bed or the floor (prone position). Prop yourself up on your elbows. Use your arms to help lift your chest up until you feel a gentle stretch in your abdomen and your lower back. This will place some of your body weight on your elbows. If this is uncomfortable, try stacking pillows under your chest. Your hips should stay down, against the surface that you are lying on. Keep your hip and back muscles relaxed. Hold this position for ___30_______ seconds. Slowly relax your upper body and return to the starting position. Repeat ____3______ times. Complete this exercise ____2______ times a day. Strengthening exercises These exercises build strength and  endurance in your back. Endurance is the ability to use your muscles for a long time, even after they get tired. Pelvic tilt This exercise strengthens the muscles that lie deep in the abdomen. Lie on your back on a firm bed or the floor with your legs extended. Bend your knees so they are pointing toward the ceiling and your feet are flat on the floor. Tighten your lower abdominal muscles to press your lower back against the floor. This motion will tilt your pelvis so your tailbone points up toward the ceiling instead of pointing to your feet or the floor. To help with this exercise, you may place a small towel under your lower back and try to push your back into the towel. Hold this position for ____30______ seconds. Let your muscles relax completely before you repeat this exercise. Repeat _____3_____ times. Complete this exercise ____2______ times a day. Alternating arm and leg raises  Get on your hands and knees on a firm surface. If you are on a hard floor, you may want to use  padding, such as an exercise mat, to cushion your knees. Line up your arms and legs. Your hands should be directly below your shoulders, and your knees should be directly below your hips. Lift your left leg behind you. At the same time, raise your right arm and straighten it in front of you. Do not lift your leg higher than your hip. Do not lift your arm higher than your shoulder. Keep your abdominal and back muscles tight. Keep your hips facing the ground. Do not arch your back. Keep your balance carefully, and do not hold your breath. Hold this position for ___30_______ seconds. Slowly return to the starting position. Repeat with your right leg and your left arm. Repeat ___3_______ times. Complete this exercise ______2____ times a day. Abdominal set with straight leg raise  Lie on your back on a firm bed or the floor. Bend one of your knees and keep your other leg straight. Tense your abdominal muscles and  lift your straight leg up, 4-6 inches (10-15 cm) off the ground. Keep your abdominal muscles tight and hold this position for __30________ seconds. Do not hold your breath. Do not arch your back. Keep it flat against the ground. Keep your abdominal muscles tense as you slowly lower your leg back to the starting position. Repeat with your other leg. Repeat ____3______ times. Complete this exercise ___2_______ times a day. Single leg lower with bent knees Lie on your back on a firm bed or the floor. Tense your abdominal muscles and lift your feet off the floor, one foot at a time, so your knees and hips are bent in 90-degree angles (right angles). Your knees should be over your hips and your lower legs should be parallel to the floor. Keeping your abdominal muscles tense and your knee bent, slowly lower one of your legs so your toe touches the ground. Lift your leg back up to return to the starting position. Do not hold your breath. Do not let your back arch. Keep your back flat against the ground. Repeat with your other leg. Repeat ___3_______ times. Complete this exercise ____2______ times a day. Posture and body mechanics Good posture and healthy body mechanics can help to relieve stress in your body's tissues and joints. Body mechanics refers to the movements and positions of your body while you do your daily activities. Posture is part of body mechanics. Good posture means: Your spine is in its natural S-curve position (neutral). Your shoulders are pulled back slightly. Your head is not tipped forward (neutral). Follow these guidelines to improve your posture and body mechanics in your everyday activities. Standing  When standing, keep your spine neutral and your feet about hip-width apart. Keep a slight bend in your knees. Your ears, shoulders, and hips should line up. When you do a task in which you stand in one place for a long time, place one foot up on a stable object that is 2-4  inches (5-10 cm) high, such as a footstool. This helps keep your spine neutral. Sitting  When sitting, keep your spine neutral and keep your feet flat on the floor. Use a footrest, if necessary, and keep your thighs parallel to the floor. Avoid rounding your shoulders, and avoid tilting your head forward. When working at a desk or a computer, keep your desk at a height where your hands are slightly lower than your elbows. Slide your chair under your desk so you are close enough to maintain good posture. When working at a computer,  place your monitor at a height where you are looking straight ahead and you do not have to tilt your head forward or downward to look at the screen. Resting When lying down and resting, avoid positions that are most painful for you. If you have pain with activities such as sitting, bending, stooping, or squatting, lie in a position in which your body does not bend very much. For example, avoid curling up on your side with your arms and knees near your chest (fetal position). If you have pain with activities such as standing for a long time or reaching with your arms, lie with your spine in a neutral position and bend your knees slightly. Try the following positions: Lying on your side with a pillow between your knees. Lying on your back with a pillow under your knees. Lifting  When lifting objects, keep your feet at least shoulder-width apart and tighten your abdominal muscles. Bend your knees and hips and keep your spine neutral. It is important to lift using the strength of your legs, not your back. Do not lock your knees straight out. Always ask for help to lift heavy or awkward objects. This information is not intended to replace advice given to you by your health care provider. Make sure you discuss any questions you have with your health care provider. Document Revised: 05/02/2020 Document Reviewed: 05/02/2020 Elsevier Patient Education  [place]. Lumbar  Strain A lumbar strain, which is sometimes called a low-back strain, is a stretch or tear in a muscle or tendons in the lower back (lumbar spine). Tendons are the strong cords of tissue that attach muscle to bone. This type of injury occurs when muscles or tendons are torn or are stretched beyond their limits. Lumbar strains can range from mild to severe. Mild strains may involve stretching a muscle or tendon without tearing it. These may heal in 1-2 weeks. More severe strains involve tearing of muscle fibers or tendons. These will cause more pain and may take 6-8 weeks to heal. What are the causes? This condition may be caused by: Trauma, such as a fall or a hit to the body. Twisting or overstretching the back. This may result from doing activities that take a lot of energy, such as lifting heavy objects. What increases the risk? A lumbar strain is more common in: Athletes. People with obesity. People who do repeated lifting, bending, or other movements that involve their back. What are the signs or symptoms? Symptoms of this condition may include: Sharp or dull pain in the lower back that does not go away. The pain may spread to the buttocks. Stiffness or limited range of motion. Sudden muscle tightening (spasms). How is this diagnosed? This condition may be diagnosed based on: Your symptoms. Your medical history. A physical exam. Imaging tests, such as: X-rays. MRI. How is this treated? Treatment for this condition may include: Rest. Applying heat and cold to the affected area. Over-the-counter medicines to help with pain and inflammation, such as NSAIDs. Prescription medicine for pain or to relax the muscles. These may be needed for a short time. Physical therapy exercises to improve movement and strength. Follow these instructions at home: Managing pain, stiffness, and swelling     If told, put ice on the injured area during the first 24 hours after your injury. Put ice in  a plastic bag. Place a towel between your skin and the bag. Leave the ice on for 20 minutes, 2-3 times a day. If told,  apply heat to the affected area as often as told by your health care provider. Use the heat source that your health care provider recommends, such as a moist heat pack or a heating pad. Place a towel between your skin and the heat source. Leave the heat on for 20-30 minutes. If your skin turns bright red, remove the ice or heat right away to prevent skin damage. The risk of damage is higher if you cannot feel pain, heat, or cold. Activity Rest and return to your normal activities as told by your health care provider. Ask your health care provider what activities are safe for you. Do exercises as told by your health care provider. General instructions Take over-the-counter and prescription medicines only as told by your health care provider. Ask your health care provider if the medicine prescribed to you: Requires you to avoid driving or using machinery. Can cause constipation. You may need to take these actions to prevent or treat constipation: Drink enough fluid to keep your urine pale yellow. Take over-the-counter or prescription medicines. Eat foods that are high in fiber, such as beans, whole grains, and fresh fruits and vegetables. Limit foods that are high in fat and processed sugars, such as fried or sweet foods. Do not use any products that contain nicotine or tobacco. These products include cigarettes, chewing tobacco, and vaping devices, such as e-cigarettes. If you need help quitting, ask your health care provider. How is this prevented? To prevent a future low-back injury: Always warm up properly before physical activity or sports. Cool down and stretch after being active. Use correct form when playing sports and lifting heavy objects. Bend your knees before you lift heavy objects. Use good posture when sitting and standing. Stay physically fit and keep a  healthy weight. Do at least 150 minutes of moderate intensity exercise each week, such as brisk walking or water aerobics. Do strength exercises at least 2 times each week. Contact a health care provider if: Your back pain does not improve after several weeks of treatment. Your symptoms get worse. You have a fever. Get help right away if: Your back pain is severe. You are unable to stand or walk. You develop pain in your legs. You have weakness in your buttocks or legs. You have trouble controlling when you urinate or when you have a bowel movement. You have frequent, painful, or bloody urination. This information is not intended to replace advice given to you by your health care provider. Make sure you discuss any questions you have with your health care provider. Document Revised: 09/05/2021 Document Reviewed: 09/05/2021 Elsevier Patient Education  [place] [facility]rain [facility] Ask your health care provider which exercises are safe for you. Do exercises exactly as told by your provider and adjust them as directed. It is normal to feel mild stretching, pulling, tightness, or discomfort as you do these exercises. Stop right away if you feel sudden pain or your pain gets worse. Do not begin these exercises until told by your provider. Stretching and range-of-motion exercise This exercise warms up your muscles and joints and improves the movement and flexibility of your back and shoulders. This exercise also helps to relieve pain. Chest and spine stretch  Lie down on your back on a firm surface. Roll a towel or a small blanket so it is about 4 inches (10 cm) in diameter. Put the towel under the middle of your back so it is under your spine, but not under your shoulder blades.  Put your hands behind your head and let your elbows fall to your sides. This will increase your stretch. Take a deep breath (inhale). Hold for _____30_____ seconds. Relax after you breathe out  (exhale). Repeat ____3______ times. Complete this exercise _____2_____ times a day. Strengthening exercises These exercises build strength and endurance in your back and your shoulder blade muscles. Endurance is the ability to use your muscles for a long time, even after they get tired. Alternating arm and leg raises  Get on your hands and knees on a firm surface. If you are on a hard floor, you may want to use padding, such as an exercise mat, to cushion your knees. Line up your arms and legs. Your hands should be directly below your shoulders, and your knees should be directly below your hips. Lift your left leg behind you. At the same time, raise your right arm and straighten it in front of you. Do not lift your leg higher than your hip. Do not lift your arm higher than your shoulder. Keep your abdominal and back muscles tight. Keep your hips facing the ground. Do not arch your back. Carefully stay balanced. Do not hold your breath. Hold for ____30______ seconds. Slowly return to the starting position and repeat with your right leg and your left arm. Repeat ____3______ times. Complete this exercise ______2____ times a day. Straight arm rows This exercise is also called the shoulder extension exercise. Stand with your feet shoulder width apart. Secure an exercise band to a stable object in front of you so the band is at or above shoulder height. Hold one end of the exercise band in each hand. Straighten your elbows and lift your hands up to shoulder height. Step back, away from the secured end of the exercise band, until the band stretches. Squeeze your shoulder blades together and pull your hands down to the sides of your thighs. Stop when your hands are straight down by your sides. This is shoulder extension. Do not let your hands go behind your body. Hold for ____30______ seconds. Slowly return to the starting position. Repeat ______3____ times. Complete this exercise _____2_____  times a day. Rowing scapular retraction This is an exercise in which the shoulder blades (scapulae) are pulled toward each other (retraction). Sit in a stable chair without armrests, or stand up. Secure an exercise band to a stable object in front of you so the band is at shoulder height. Hold one end of the exercise band in each hand. Your palms should face toward each other. Bring your arms out straight in front of you. Step back, away from the secured end of the exercise band, until the band stretches. Pull the band backward. As you do this, bend your elbows and squeeze your shoulder blades together, but avoid letting the rest of your body move. Do not shrug your shoulders upward while you do this. Stop when your elbows are at your sides or slightly behind your body. Hold for ____30______ seconds. Slowly straighten your arms to return to the starting position. Repeat ____3______ times. Complete this exercise ___2_______ times a day. Posture and body mechanics Good posture and healthy body mechanics can help to relieve stress in your body's tissues and joints. Body mechanics refers to the movements and positions of your body while you do your daily activities. Posture is part of body mechanics. Good posture means: Your spine is in its natural S-curve position (neutral). Your shoulders are pulled back slightly. Your head is not tipped forward.  Follow these guidelines to improve your posture and body mechanics in your everyday activities. Standing  When standing, keep your spine neutral and your feet about hip width apart. Keep a slight bend in your knees. Your ears, shoulders, and hips should line up with each other. When you do a task in which you lean forward while standing in one place for a long time, place one foot up on a stable object that is 2-4 inches (5-10 cm) high, such as a footstool. This helps keep your spine neutral. Sitting  When sitting, keep your spine neutral and keep  your feet flat on the floor. Use a footrest if needed. Keep your thighs parallel to the floor. Avoid rounding your shoulders, and avoid tilting your head forward. When working at a desk or a computer, keep your desk at a height where your hands are slightly lower than your elbows. Slide your chair under your desk so you are close enough to maintain good posture. When working at a computer, place your monitor at a height where you are looking straight ahead and you do not have to tilt your head forward or downward to look at the screen. Resting When lying down and resting, avoid positions that are most painful for you. If you have pain with activities such as sitting, bending, stooping, or squatting (flexion-basedactivities), lie in a position in which your body does not bend very much. For example, avoid curling up on your side with your arms and knees near your chest (fetal position). If you have pain with activities such as standing for a long time or reaching with your arms (extension-basedactivities), lie with your spine in a neutral position and bend your knees slightly. Try the following positions: Lie on your side with a pillow between your knees. Lie on your back with a pillow under your knees.  Lifting  When lifting objects, keep your feet at least shoulder width apart and tighten your abdominal muscles. Bend your knees and hips and keep your spine neutral. It is important to lift using the strength of your legs, not your back. Do not lock your knees straight out. Always ask for help to lift heavy or awkward objects. This information is not intended to replace advice given to you by your health care provider. Make sure you discuss any questions you have with your health care provider. Document Revised: 10/02/2021 Document Reviewed: 10/02/2021 Elsevier Patient Education  [place]. Thoracic Strain A thoracic strain is an injury to the muscles or tendons that attach to the upper  part of your back behind your chest. Tendons are tissues that connect muscle to bone. This injury is sometimes called a mid-back strain. It happens when a muscle is stretched too far or overloaded. Thoracic strains can range from mild to severe. Mild strains may involve stretching a muscle or tendon without tearing it. These injuries may heal in 1-2 weeks. More severe strains involve tearing of muscle fibers or tendons. These will cause more pain and may take 6-8 weeks to heal. What are the causes? This condition may be caused by: Trauma, such as a fall or a hit to the body. Twisting or overstretching the back. This may result from doing activities that take a lot of energy, such as lifting heavy objects. In some cases, the cause may not be known. What increases the risk? This injury is more common in: Athletes. People with obesity. What are the signs or symptoms? The main symptom of this condition is  pain in the middle back, especially with movement. Other symptoms include: Stiffness or limited range of motion. Sudden muscle tightening (spasms). How is this diagnosed? This condition may be diagnosed based on: Your symptoms. Your medical history. A physical exam. Imaging tests, such as X-rays, a CT scan, or an MRI. How is this treated? This condition may be treated with: Resting the injured area. Applying heat and cold to the injured area. Over-the-counter medicines for pain and inflammation, such as NSAIDs. Prescription pain medicine or muscle relaxants. These may be needed for a short time. Doing exercises to improve movement and strength in your back (physical therapy). Treatments applied to the painful area, such as: Electrical stimulation. Stimulating the muscle with small needles (dry needling). Injections of medicine (trigger point injections). Follow these instructions at home: Managing pain, stiffness, and swelling     If told, put ice on the injured area. Put ice in a  plastic bag. Place a towel between your skin and the bag. Leave the ice on for 20 minutes, 2-3 times a day. If told, apply heat to the affected area as often as told by your health care provider. Use the heat source that your provider recommends, such as a moist heat pack or a heating pad. Place a towel between your skin and the heat source. Leave the heat on for 20-30 minutes. If your skin turns bright red, remove the ice or heat right away to prevent skin damage. The risk of damage is higher if you cannot feel pain, heat, or cold. Activity Rest and return to your normal activities as told by your provider. Ask your provider what activities are safe for you. Do exercises as told by your provider. Medicines Take over-the-counter and prescription medicines only as told by your provider. Ask your provider if the medicine prescribed to you: Requires you to avoid driving or using machinery. Can cause constipation. You may need to take these actions to prevent or treat constipation: Drink enough fluid to keep your pee (urine) pale yellow. Take over-the-counter or prescription medicines. Eat foods that are high in fiber, such as beans, whole grains, and fresh fruits and vegetables. Limit foods that are high in fat and processed sugars, such as fried or sweet foods. General instructions Do not use any products that contain nicotine or tobacco. These products include cigarettes, chewing tobacco, and vaping devices, such as e-cigarettes. If you need help quitting, ask your provider. Keep all follow-up visits. Your provider will monitor your injury and activity. How is this prevented? To prevent a future mid-back injury: Always warm up before physical activity or sports. Cool down and stretch after being active. Use correct form when playing sports and lifting heavy objects. Bend your knees before you lift heavy objects. Use good posture when sitting and standing. Stay physically fit and maintain a  healthy weight. Do at least 150 minutes of moderate-intensity exercise each week, such as brisk walking or water aerobics. Do strength exercises at least 2 times each week. Contact a health care provider if: Your pain is not helped by medicine. Your pain or stiffness is getting worse. You develop pain or stiffness in your neck or lower back. Get help right away if: You have shortness of breath. You have chest pain. You have numbness, tingling, or weakness in your legs. You are not able to control when you pee (urinary incontinence). These symptoms may be an emergency. Get help right away. Call 911. Do not wait to see if the symptoms will  go away. Do not drive yourself to the hospital. This information is not intended to replace advice given to you by your health care provider. Make sure you discuss any questions you have with your health care provider. Document Revised: 10/02/2021 Document Reviewed: 10/02/2021 Elsevier Patient Education  [place].

## 2022-05-10 NOTE — Progress Notes (Signed)
Subjective:    Patient ID: Jamie Kelley, female    DOB: [birth year], 67 y.o.   MRN: [id number]  66y/o established african [place] female here for evaluation left side pain radiates from mid back to hip/low back denied loss of bowel/bladder control, arm/leg weakness or saddle paresthesias.  Thermacare and tylenol helping the most has tried biofreeze and ibuprofen also.  Saw RN Kimrey yesterday.  Denied known injury.  Has been pulling larger items for prep recently that are not easy to handle.  Denied bruising/rash/swelling/dysuria/fever/chills/hematuria/smelly urine/cloudy urine/decreased stream/new hobbies/gardening/activities.  Ran out of flexeril at home and would like refill.      Review of Systems  Constitutional:  Negative for activity change, chills and fever.  HENT:  Negative for trouble swallowing and voice change.   Respiratory:  Negative for cough, choking and chest tightness.   Cardiovascular:  Negative for leg swelling.  Gastrointestinal:  Negative for diarrhea, nausea and vomiting.  Genitourinary:  Positive for flank pain. Negative for decreased urine volume, difficulty urinating, dysuria, enuresis, frequency and hematuria.  Musculoskeletal:  Positive for back pain and myalgias. Negative for arthralgias, gait problem, joint swelling, neck pain and neck stiffness.  Skin:  Negative for color change, pallor, rash and wound.  Neurological:  Negative for dizziness, tremors, seizures, syncope, facial asymmetry, speech difficulty, weakness, light-headedness, numbness and headaches.  Hematological:  Negative for adenopathy. Does not bruise/bleed easily.  Psychiatric/Behavioral:  Negative for agitation, confusion and sleep disturbance.        Objective:   Physical Exam Vitals and nursing note reviewed.  Constitutional:      General: She is awake. She is not in acute distress.    Appearance: Normal appearance. She is well-developed and well-groomed. She is obese. She is not  ill-appearing, toxic-appearing or diaphoretic.  HENT:     Head: Normocephalic and atraumatic.     Jaw: There is normal jaw occlusion.     Salivary Glands: Right salivary gland is not diffusely enlarged. Left salivary gland is not diffusely enlarged.     Right Ear: Hearing and external ear normal.     Left Ear: Hearing and external ear normal.     Nose: Nose normal. No congestion or rhinorrhea.     Mouth/Throat:     Lips: Pink. No lesions.     Mouth: Mucous membranes are moist.     Pharynx: Oropharynx is clear.  Eyes:     General: Lids are normal. Vision grossly intact. Gaze aligned appropriately. No scleral icterus.       Right eye: No discharge.        Left eye: No discharge.     Extraocular Movements: Extraocular movements intact.     Conjunctiva/sclera: Conjunctivae normal.     Pupils: Pupils are equal, round, and reactive to light.  Neck:     Trachea: Trachea and phonation normal. No tracheal deviation.  Cardiovascular:     Rate and Rhythm: Normal rate and regular rhythm.     Pulses: Normal pulses.  Pulmonary:     Effort: Pulmonary effort is normal.     Breath sounds: Normal breath sounds and air entry. No stridor or transmitted upper airway sounds. No wheezing.     Comments: Spoke full sentences without difficulty; no cough observed in exam room Abdominal:     General: Abdomen is flat.  Musculoskeletal:        General: Tenderness present. No swelling or deformity.     Cervical back: Normal range of motion and neck supple. No  swelling, edema, deformity, erythema, signs of trauma, lacerations, rigidity, spasms, torticollis, tenderness or crepitus. No pain with movement or muscular tenderness. Normal range of motion.     Thoracic back: No swelling, edema, deformity, signs of trauma, lacerations, spasms, tenderness or bony tenderness. Decreased range of motion. No scoliosis.     Lumbar back: Tenderness present. No swelling, edema, deformity, signs of trauma, lacerations, spasms or  bony tenderness. Decreased range of motion. No scoliosis.       Back:     Right lower leg: No edema.     Left lower leg: No edema.     Comments: Bilateral SI joints not TTP; TTP paraspinals/latismus dorsi left thoracic/lumbar left ; on/off exam table without difficulty/in/out of chair without difficulty; gait sure and steady; normal heel toe walk in clinic able to touch knees forward flexion lumbar stops due to pain; right lateral bending/left rotation most painful; all thoracic/lumbar AROM decreased today; extension does not worsen pain  Lymphadenopathy:     Head:     Right side of head: No submandibular or preauricular adenopathy.     Left side of head: No submandibular or preauricular adenopathy.     Cervical: No cervical adenopathy.     Right cervical: No superficial cervical adenopathy.    Left cervical: No superficial cervical adenopathy.  Skin:    General: Skin is warm and dry.     Capillary Refill: Capillary refill takes less than 2 seconds.     Coloration: Skin is not ashen, cyanotic, jaundiced, mottled, pale or sallow.     Findings: No abrasion, bruising, burn, erythema, signs of injury, laceration, lesion, petechiae, rash or wound.  Neurological:     General: No focal deficit present.     Mental Status: She is alert and oriented to person, place, and time. Mental status is at baseline.     GCS: GCS eye subscore is 4. GCS verbal subscore is 5. GCS motor subscore is 6.     Cranial Nerves: Cranial nerves 2-12 are intact. No cranial nerve deficit, dysarthria or facial asymmetry.     Motor: Motor function is intact. No weakness, tremor, atrophy, abnormal muscle tone or seizure activity.     Coordination: Coordination is intact. Coordination normal.     Gait: Gait is intact. Gait normal.     Comments: In/out of chair without difficulty; gait sure and steady in clinic; bilateral hand grasp equal 5/5  Psychiatric:        Attention and Perception: Attention and perception normal.         Mood and Affect: Mood and affect normal.        Speech: Speech normal.        Behavior: Behavior normal. Behavior is cooperative.        Thought Content: Thought content normal.        Cognition and Memory: Cognition and memory normal.        Judgment: Judgment normal.      Given 1 lumbar thermacare belt from clinic stock and assisted with application in exam room     Assessment & Plan:   A-muscle spasm of back  P-cyclobenazeprine/flexeril '10mg'$  sig take 1/2-1  po qhs prn muscle spasms #30 RF0 dispensed from [facility].  Ibuprofen '800mg'$  po TID prn pain at home take with food if tylenol '1000mg'$  po q6h not providing enough relief.  Discussed avoid alcohol intake and driving after taking cyclobenazeprine/flexeril as drowsiness common side effect.  Slow position changes as medication also lower blood pressure.  Home stretches demonstrated to patient-e.g. AROM arm circles, walking up wall, chest stretches, hip AROM, lumbar/thoracic AROM lateral bending/extension/flexion and knee to chest and rock side to side on back. Self massage or professional prn, foam roller use or tennis/racquetball.  Heat/cryotherapy 15 minutes QID prn.  Trial thermacare 1 applied.  She is off work Architectural technologist.  Discussed consider tub epsom salt soak tonight  Consider physical therapy referral if no improvement with prescribed therapy from [facility] and/or chiropractic care.  Ensure ergonomics correct desk at work avoid repetitive motions if possible/holding phone/laptop in hand use desk/stand and/or break up lifting items into smaller loads/weights.  Patient was instructed to rest, ice, and ROM exercises.  Activity as tolerated.   Follow up if symptoms persist or worsen especially if loss of bowel/bladder control, arm/leg weakness and/or saddle paresthesias.  [facility] handout on muscle spasms and thoracic and lumbar strain rehab exercises given to patient.  Patient verbalized agreement and understanding of treatment plan and had no further  questions at this time.  P2:  Injury Prevention and Fitness.

## 2022-05-15 ENCOUNTER — Telehealth: Payer: Self-pay | Admitting: Registered Nurse

## 2022-05-15 ENCOUNTER — Encounter: Payer: Self-pay | Admitting: Registered Nurse

## 2022-05-15 DIAGNOSIS — E038 Other specified hypothyroidism: Secondary | ICD-10-CM

## 2022-05-15 MED ORDER — LEVOTHYROXINE SODIUM 50 MCG PO TABS: 25.0000 ug | ORAL_TABLET | Freq: Every day | ORAL | 1 refills | Status: DC

## 2022-05-15 NOTE — Telephone Encounter (Signed)
Last filled 11/16/21 120 day supply from [facility].  Labs stable  next due October.  Dispensed 90 tabs today from [facility] levothyroxine 37mcg take 1/2 tab po daily    Latest Reference Range & Units 06/16/21 09:24 12/19/21 09:39  Glucose 70 - 99 mg/dL 77 93  TSH 0.35 - 5.50 uIU/mL 0.88 1.27

## 2022-06-12 ENCOUNTER — Encounter: Payer: Self-pay | Admitting: Registered Nurse

## 2022-06-12 ENCOUNTER — Telehealth: Payer: Self-pay | Admitting: Registered Nurse

## 2022-06-12 DIAGNOSIS — Z0000 Encounter for general adult medical examination without abnormal findings: Secondary | ICD-10-CM

## 2022-06-12 DIAGNOSIS — I10 Essential (primary) hypertension: Secondary | ICD-10-CM

## 2022-06-12 MED ORDER — AMLODIPINE BESYLATE 10 MG PO TABS: 10.0000 mg | ORAL_TABLET | Freq: Every day | ORAL | 0 refills | Status: DC

## 2022-06-12 NOTE — Telephone Encounter (Signed)
Patient last filled 05/10/22 with amlodipine  take 2 tabs daily #90 RF0 as amlodipine  out of stock.  Last BP at [facility] 12/19/21  BP 128/64   Pulse 60   Temp 97.9 F (36.6 C) (Temporal)   Resp 16   Ht  (1.549 m)   Wt 192 lb (87.1 kg)   LMP 03/10/2012   SpO2 98%   BMI 36.28 kg/m   BSA 1.94 m    Last labs 12/19/21    Latest Reference Range & Units 06/16/21 09:24 12/19/21 09:39  BASIC METABOLIC PANEL  Rpt Rpt  Sodium 135 - 145 mEq/L 140 139  Potassium 3.5 - 5.1 mEq/L 4.0 3.8  Chloride 96 - 112 mEq/L 103 104  CO2 19 - 32 mEq/L 23 29  Glucose 70 - 99 mg/dL 77 93  BUN 6 - 23 mg/dL 11 11  Creatinine 1.61 - 1.20 mg/dL 0.96 0.45  Calcium 8.4 - 10.5 mg/dL 9.5 9.2  Alkaline Phosphatase 39 - 117 U/L 92 74  Albumin 3.5 - 5.2 g/dL 4.5 4.3  AST 0 - 37 U/L 17 18  ALT 0 - 35 U/L 11 11  Total Protein 6.0 - 8.3 g/dL 7.4 7.3  Bilirubin, Direct 0.0 - 0.3 mg/dL 0.1 0.1  Total Bilirubin 0.2 - 1.2 mg/dL 0.7 0.5  GFR >40.98 mL/min 95.24 95.68  Total CHOL/HDL Ratio  3 3  Cholesterol 0 - 200 mg/dL 119 147  HDL Cholesterol >39.00 mg/dL 82.95 62.13  LDL (calc) 0 - 99 mg/dL 086 (H) 578 (H)  NonHDL  116.07 119.91  Triglycerides 0.0 - 149.0 mg/dL 46.9 62.9  VLDL 0.0 - 52.8 mg/dL 41.3 24.4  VITD 01.02 - 100.00 ng/mL 19.49 (L)   WBC 4.0 - 10.5 K/uL 6.8 6.2  RBC 3.87 - 5.11 Mil/uL 4.35 4.53  Hemoglobin 12.0 - 15.0 g/dL 72.5 36.6  HCT 44.0 - 34.7 % 39.2 40.3  MCV 78.0 - 100.0 fl 90.1 88.9  MCHC 30.0 - 36.0 g/dL 42.5 95.6  RDW 38.7 - 56.4 % 14.6 14.4  Platelets 150.0 - 400.0 K/uL 202.0 185.0  Neutrophils 43.0 - 77.0 % 59.1 54.9  Lymphocytes 12.0 - 46.0 % 32.5 35.8  Monocytes Relative 3.0 - 12.0 % 6.6 7.0  Eosinophil 0.0 - 5.0 % 1.4 1.9  Basophil 0.0 - 3.0 % 0.4 0.4  NEUT# 1.4 - 7.7 K/uL 4.0 3.4  Lymphocyte # 0.7 - 4.0 K/uL 2.2 2.2  Monocyte # 0.1 - 1.0 K/uL 0.4 0.4  Eosinophils Absolute 0.0 - 0.7 K/uL 0.1 0.1  Basophils Absolute 0.0 - 0.1 K/uL 0.0 0.0  (H): Data is abnormally  high (L): Data is abnormally low Rpt: View report in Results Review for more information  Next BP recheck and labs due Oct 2024 exec panel, vitamin D and Hgba1c ordered.  History of low vitamin D since 2017 and needs 2024 recheck and elevated Hgba1c 5.9 2021 and no recheck by PCM.

## 2022-06-19 ENCOUNTER — Ambulatory Visit: Payer: No Typology Code available for payment source | Admitting: Family Medicine

## 2022-06-19 ENCOUNTER — Telehealth: Payer: Self-pay | Admitting: Registered Nurse

## 2022-06-19 ENCOUNTER — Encounter: Payer: Self-pay | Admitting: Registered Nurse

## 2022-06-19 NOTE — Telephone Encounter (Signed)
Patient requested refill atenolol  t2 po daily from [facility] last fill 03/06/2022.  Last labs   Latest Reference Range & Units 12/19/21 09:39  Sodium 135 - 145 mEq/L 139  Potassium 3.5 - 5.1 mEq/L 3.8  Chloride 96 - 112 mEq/L 104  CO2 19 - 32 mEq/L 29  Glucose 70 - 99 mg/dL 93  BUN 6 - 23 mg/dL 11  Creatinine 1.61 - 0.96 mg/dL 0.45  Calcium 8.4 - 40.9 mg/dL 9.2  Alkaline Phosphatase 39 - 117 U/L 74  Albumin 3.5 - 5.2 g/dL 4.3  AST 0 - 37 U/L 18  ALT 0 - 35 U/L 11  Total Protein 6.0 - 8.3 g/dL 7.3  Bilirubin, Direct 0.0 - 0.3 mg/dL 0.1  Total Bilirubin 0.2 - 1.2 mg/dL 0.5  GFR >81.19 mL/min 95.68  Total CHOL/HDL Ratio  3  Cholesterol 0 - 200 mg/dL 147  HDL Cholesterol >82.95 mg/dL 62.13  LDL (calc) 0 - 99 mg/dL 086 (H)  NonHDL  578.46  Triglycerides 0.0 - 149.0 mg/dL 96.2  VLDL 0.0 - 95.2 mg/dL 84.1  WBC 4.0 - 32.4 K/uL 6.2  RBC 3.87 - 5.11 Mil/uL 4.53  Hemoglobin 12.0 - 15.0 g/dL 40.1  HCT 02.7 - 25.3 % 40.3  MCV 78.0 - 100.0 fl 88.9  MCHC 30.0 - 36.0 g/dL 66.4  RDW 40.3 - 47.4 % 14.4  Platelets 150.0 - 400.0 K/uL 185.0  Neutrophils 43.0 - 77.0 % 54.9  Lymphocytes 12.0 - 46.0 % 35.8  Monocytes Relative 3.0 - 12.0 % 7.0  Eosinophil 0.0 - 5.0 % 1.9  Basophil 0.0 - 3.0 % 0.4  NEUT# 1.4 - 7.7 K/uL 3.4  Lymphocyte # 0.7 - 4.0 K/uL 2.2  Monocyte # 0.1 - 1.0 K/uL 0.4  Eosinophils Absolute 0.0 - 0.7 K/uL 0.1  Basophils Absolute 0.0 - 0.1 K/uL 0.0  TSH 0.35 - 5.50 uIU/mL 1.27  (H): Data is abnormally high Last BP 05/10/22 BP 122/80 (BP Location: Left Arm, Patient Position: Sitting, Cuff Size: Large)   Pulse 64     Dispensed 90 days supply to patient #180 RFo from [facility] today.

## 2022-06-22 ENCOUNTER — Ambulatory Visit: Payer: No Typology Code available for payment source | Admitting: Family Medicine

## 2022-06-29 ENCOUNTER — Encounter: Payer: Self-pay | Admitting: Family Medicine

## 2022-06-29 ENCOUNTER — Ambulatory Visit: Payer: No Typology Code available for payment source | Admitting: Family Medicine

## 2022-06-29 VITALS — BP 126/70 | HR 64 | Temp 98.3°F | Ht 61.0 in | Wt 196.8 lb

## 2022-06-29 DIAGNOSIS — E785 Hyperlipidemia, unspecified: Secondary | ICD-10-CM

## 2022-06-29 DIAGNOSIS — I10 Essential (primary) hypertension: Secondary | ICD-10-CM | POA: Diagnosis not present

## 2022-06-29 DIAGNOSIS — E559 Vitamin D deficiency, unspecified: Secondary | ICD-10-CM

## 2022-06-29 DIAGNOSIS — E038 Other specified hypothyroidism: Secondary | ICD-10-CM

## 2022-06-29 NOTE — Assessment & Plan Note (Signed)
Chronic problem.  On Simvastatin 20mg daily w/o difficulty.  Check labs.  Adjust meds prn  

## 2022-06-29 NOTE — Progress Notes (Signed)
   Subjective:    Patient ID: Jamie Kelley, female    DOB: Dec 02, 1955, 67 y.o.   MRN: [id number]  HPI HTN- chronic problem, on Amlodipine 10mg  daily, Atenolol 100mg  BID w/ good control.  No CP, SOB, HA's, visual changes, edema.  Hyperlipidemia- chronic problem, on Simvastatin 20mg  daily.  No abd pain, N/V.  Hypothyroid- chronic problem, on Levothyroxine daily.  Pt reports fatigue.  No changes to skin/hair/nails.  Morbid obesity- pt has gained 5 lbs since last visit.  No regular exercise.  Not following a particular diet.   Review of Systems For ROS see HPI     Objective:   Physical Exam Vitals reviewed.  Constitutional:      General: She is not in acute distress.    Appearance: Normal appearance. She is well-developed. She is obese. She is not ill-appearing.  HENT:     Head: Normocephalic and atraumatic.  Eyes:     Conjunctiva/sclera: Conjunctivae normal.     Pupils: Pupils are equal, round, and reactive to light.  Neck:     Thyroid: Thyromegaly (multinodular goiter) present.  Cardiovascular:     Rate and Rhythm: Normal rate and regular rhythm.     Pulses: Normal pulses.     Heart sounds: Normal heart sounds. No murmur heard. Pulmonary:     Effort: Pulmonary effort is normal. No respiratory distress.     Breath sounds: Normal breath sounds.  Abdominal:     General: There is no distension.     Palpations: Abdomen is soft.     Tenderness: There is no abdominal tenderness.  Musculoskeletal:     Cervical back: Normal range of motion and neck supple.     Right lower leg: No edema.     Left lower leg: No edema.  Lymphadenopathy:     Cervical: No cervical adenopathy.  Skin:    General: Skin is warm and dry.  Neurological:     Mental Status: She is alert and oriented to person, place, and time.  Psychiatric:        Behavior: Behavior normal.           Assessment & Plan:

## 2022-06-29 NOTE — Patient Instructions (Signed)
Schedule your complete physical in 6 months We'll notify you of your lab results and make any changes if needed Try and work on healthy diet and regular exercise- you can do it!!! Call with any questions or concerns Stay Safe!  Stay Healthy! Happy Early [place] Ouch!!!

## 2022-06-29 NOTE — Assessment & Plan Note (Signed)
Chronic problem.  On Amlodipine 10mg  daily and Atenolol 100mg  BID w/ good control.  Currently asymptomatic.  No med changes at this time

## 2022-06-29 NOTE — Assessment & Plan Note (Signed)
Chronic problem.  Pt was following w/ ENT and had a biopsy done in 2022.  That was benign and she is not interested in additional f/u.  She is complaining of fatigue so check TSH and adjust meds prn.

## 2022-06-29 NOTE — Assessment & Plan Note (Signed)
Deteriorated.  Pt has gained 5 lbs since last visit.  BMI now 37.19 and coupled w/ her HTN and hyperlipidemia this qualifies as morbidly obese.  Check labs to risk stratify.  Encouraged low carb diet and regular exercise.  Will follow

## 2022-06-29 NOTE — Assessment & Plan Note (Signed)
Check labs and replete prn. 

## 2022-07-02 ENCOUNTER — Telehealth: Payer: Self-pay

## 2022-07-02 DIAGNOSIS — E785 Hyperlipidemia, unspecified: Secondary | ICD-10-CM

## 2022-07-02 DIAGNOSIS — Z0000 Encounter for general adult medical examination without abnormal findings: Secondary | ICD-10-CM

## 2022-07-02 DIAGNOSIS — E041 Nontoxic single thyroid nodule: Secondary | ICD-10-CM

## 2022-07-02 DIAGNOSIS — E559 Vitamin D deficiency, unspecified: Secondary | ICD-10-CM

## 2022-07-02 NOTE — Telephone Encounter (Signed)
Blood specimen was untested and called to request she come back to the office or see [facility] location she opted for [facility] I have reordered all tests that failed

## 2022-07-24 ENCOUNTER — Encounter: Payer: Self-pay | Admitting: Registered Nurse

## 2022-07-24 ENCOUNTER — Telehealth: Payer: Self-pay | Admitting: Registered Nurse

## 2022-07-24 DIAGNOSIS — E7800 Pure hypercholesterolemia, unspecified: Secondary | ICD-10-CM

## 2022-07-24 MED ORDER — SIMVASTATIN 20 MG PO TABS: 20.0000 mg | ORAL_TABLET | Freq: Every day | ORAL | 0 refills | Status: DC

## 2022-07-24 NOTE — Telephone Encounter (Signed)
Last [facility] fill 02/23/2021 Last labs Oct 2023 [facility] appt 06/29/22  90 day supply simvastatin 20mg  po daily dispensed to patient today from [facility]   Latest Reference Range & Units 06/16/21 09:24 12/19/21 09:39  Sodium 135 - 145 mEq/L 140 139  Potassium 3.5 - 5.1 mEq/L 4.0 3.8  Chloride 96 - 112 mEq/L 103 104  CO2 19 - 32 mEq/L 23 29  Glucose 70 - 99 mg/dL 77 93  BUN 6 - 23 mg/dL 11 11  Creatinine 1.61 - 1.20 mg/dL 0.96 0.45  Calcium 8.4 - 10.5 mg/dL 9.5 9.2  Alkaline Phosphatase 39 - 117 U/L 92 74  Albumin 3.5 - 5.2 g/dL 4.5 4.3  AST 0 - 37 U/L 17 18  ALT 0 - 35 U/L 11 11  Total Protein 6.0 - 8.3 g/dL 7.4 7.3  Bilirubin, Direct 0.0 - 0.3 mg/dL 0.1 0.1  Total Bilirubin 0.2 - 1.2 mg/dL 0.7 0.5  GFR >40.98 mL/min 95.24 95.68  Total CHOL/HDL Ratio  3 3  Cholesterol 0 - 200 mg/dL 119 147  HDL Cholesterol >39.00 mg/dL 82.95 62.13  LDL (calc) 0 - 99 mg/dL 086 (H) 578 (H)  NonHDL  116.07 119.91  Triglycerides 0.0 - 149.0 mg/dL 46.9 62.9  VLDL 0.0 - 52.8 mg/dL 41.3 24.4  VITD 01.02 - 100.00 ng/mL 19.49 (L)   WBC 4.0 - 10.5 K/uL 6.8 6.2  RBC 3.87 - 5.11 Mil/uL 4.35 4.53  Hemoglobin 12.0 - 15.0 g/dL 72.5 36.6  HCT 44.0 - 34.7 % 39.2 40.3  MCV 78.0 - 100.0 fl 90.1 88.9  MCHC 30.0 - 36.0 g/dL 42.5 95.6  RDW 38.7 - 56.4 % 14.6 14.4  Platelets 150.0 - 400.0 K/uL 202.0 185.0  Neutrophils 43.0 - 77.0 % 59.1 54.9  Lymphocytes 12.0 - 46.0 % 32.5 35.8  Monocytes Relative 3.0 - 12.0 % 6.6 7.0  Eosinophil 0.0 - 5.0 % 1.4 1.9  Basophil 0.0 - 3.0 % 0.4 0.4  NEUT# 1.4 - 7.7 K/uL 4.0 3.4  Lymphocyte # 0.7 - 4.0 K/uL 2.2 2.2  Monocyte # 0.1 - 1.0 K/uL 0.4 0.4  Eosinophils Absolute 0.0 - 0.7 K/uL 0.1 0.1  Basophils Absolute 0.0 - 0.1 K/uL 0.0 0.0  TSH 0.35 - 5.50 uIU/mL 0.88 1.27  (H): Data is abnormally high (L): Data is abnormally low

## 2022-10-04 ENCOUNTER — Encounter: Payer: Self-pay | Admitting: Registered Nurse

## 2022-10-04 ENCOUNTER — Telehealth: Payer: Self-pay | Admitting: Registered Nurse

## 2022-10-04 DIAGNOSIS — I10 Essential (primary) hypertension: Secondary | ICD-10-CM

## 2022-10-04 DIAGNOSIS — E7849 Other hyperlipidemia: Secondary | ICD-10-CM

## 2022-10-04 MED ORDER — AMLODIPINE BESYLATE 10 MG PO TABS: 10.0000 mg | ORAL_TABLET | Freq: Every day | ORAL | Status: DC

## 2022-10-04 MED ORDER — ATENOLOL 50 MG PO TABS: 100.0000 mg | ORAL_TABLET | Freq: Every day | ORAL | Status: DC

## 2022-10-04 MED ORDER — SIMVASTATIN 20 MG PO TABS: 20.0000 mg | ORAL_TABLET | Freq: Every day | ORAL | Status: DC

## 2022-10-04 NOTE — Telephone Encounter (Signed)
Patient last filled amlodipine 06/12/2022; atenolol 06/19/22 and simvastatin 07/24/22 90 day supplies each from [facility].  Saw PCM 06/29/22  Last labs  Latest Reference Range & Units 12/19/21 09:39  Sodium 135 - 145 mEq/L 139  Potassium 3.5 - 5.1 mEq/L 3.8  Chloride 96 - 112 mEq/L 104  CO2 19 - 32 mEq/L 29  Glucose 70 - 99 mg/dL 93  BUN 6 - 23 mg/dL 11  Creatinine 4.09 - 8.11 mg/dL 9.14  Calcium 8.4 - 78.2 mg/dL 9.2  Alkaline Phosphatase 39 - 117 U/L 74  Albumin 3.5 - 5.2 g/dL 4.3  AST 0 - 37 U/L 18  ALT 0 - 35 U/L 11  Total Protein 6.0 - 8.3 g/dL 7.3  Bilirubin, Direct 0.0 - 0.3 mg/dL 0.1  Total Bilirubin 0.2 - 1.2 mg/dL 0.5  GFR >95.62 mL/min 95.68  Total CHOL/HDL Ratio  3  Cholesterol 0 - 200 mg/dL 130  HDL Cholesterol >86.57 mg/dL 84.69  LDL (calc) 0 - 99 mg/dL 629 (H)  NonHDL  528.41  Triglycerides 0.0 - 149.0 mg/dL 32.4  VLDL 0.0 - 40.1 mg/dL 02.7  WBC 4.0 - 25.3 K/uL 6.2  RBC 3.87 - 5.11 Mil/uL 4.53  Hemoglobin 12.0 - 15.0 g/dL 66.4  HCT 40.3 - 47.4 % 40.3  MCV 78.0 - 100.0 fl 88.9  MCHC 30.0 - 36.0 g/dL 25.9  RDW 56.3 - 87.5 % 14.4  Platelets 150.0 - 400.0 K/uL 185.0  Neutrophils 43.0 - 77.0 % 54.9  Lymphocytes 12.0 - 46.0 % 35.8  Monocytes Relative 3.0 - 12.0 % 7.0  Eosinophil 0.0 - 5.0 % 1.9  Basophil 0.0 - 3.0 % 0.4  NEUT# 1.4 - 7.7 K/uL 3.4  Lymphocyte # 0.7 - 4.0 K/uL 2.2  Monocyte # 0.1 - 1.0 K/uL 0.4  Eosinophils Absolute 0.0 - 0.7 K/uL 0.1  Basophils Absolute 0.0 - 0.1 K/uL 0.0  TSH 0.35 - 5.50 uIU/mL 1.27  (H): Data is abnormally high  Dispensed 90 day supply simvastatin 20mg , atenolol 50mg  and amlodipine 10mg  to patient today from [facility].  Notified patient levothyroxine is available through [facility] also.  Last filled 90 tabs 05/15/2022 Patient verbalized understanding information/instructions, agreed with plan of care and had no furher questions at this time.

## 2022-12-06 ENCOUNTER — Telehealth: Payer: Self-pay | Admitting: Registered Nurse

## 2022-12-06 ENCOUNTER — Encounter: Payer: Self-pay | Admitting: Registered Nurse

## 2022-12-06 DIAGNOSIS — E039 Hypothyroidism, unspecified: Secondary | ICD-10-CM

## 2022-12-06 MED ORDER — LEVOTHYROXINE SODIUM 50 MCG PO TABS: 50.0000 ug | ORAL_TABLET | Freq: Every day | ORAL | 0 refills | Status: DC

## 2022-12-06 NOTE — Telephone Encounter (Signed)
Last filled 05/15/22 180 day supply from [facility].  Labs stable  next due October.  Dispensed 90 tabs today from [facility] levothyroxine take 1/2 tab po daily     Latest Reference Range & Units 06/16/21 09:24 12/19/21 09:39  Glucose 70 - 99 mg/dL 77 93  TSH 1.61 - 0.96 uIU/mL 0.88 1.27

## 2023-01-04 ENCOUNTER — Ambulatory Visit: Payer: No Typology Code available for payment source | Admitting: Family Medicine

## 2023-01-04 ENCOUNTER — Encounter: Payer: Self-pay | Admitting: Family Medicine

## 2023-01-04 VITALS — BP 122/70 | HR 68 | Temp 97.7°F | Ht 62.0 in | Wt 196.6 lb

## 2023-01-04 DIAGNOSIS — I10 Essential (primary) hypertension: Secondary | ICD-10-CM

## 2023-01-04 DIAGNOSIS — E559 Vitamin D deficiency, unspecified: Secondary | ICD-10-CM | POA: Diagnosis not present

## 2023-01-04 DIAGNOSIS — Z23 Encounter for immunization: Secondary | ICD-10-CM

## 2023-01-04 DIAGNOSIS — Z0000 Encounter for general adult medical examination without abnormal findings: Secondary | ICD-10-CM | POA: Diagnosis not present

## 2023-01-04 LAB — CBC WITH DIFFERENTIAL/PLATELET
Basophils Absolute: 0.1 10*3/uL (ref 0.0–0.1)
Basophils Relative: 0.7 % (ref 0.0–3.0)
Eosinophils Absolute: 0.1 10*3/uL (ref 0.0–0.7)
Eosinophils Relative: 1.3 % (ref 0.0–5.0)
HCT: 38.1 % (ref 36.0–46.0)
Hemoglobin: 13.1 g/dL (ref 12.0–15.0)
Lymphocytes Relative: 30.8 % (ref 12.0–46.0)
Lymphs Abs: 2.5 10*3/uL (ref 0.7–4.0)
MCHC: 34.3 g/dL (ref 30.0–36.0)
MCV: 88.8 fL (ref 78.0–100.0)
Monocytes Absolute: 0.7 10*3/uL (ref 0.1–1.0)
Monocytes Relative: 8.4 % (ref 3.0–12.0)
Neutro Abs: 4.8 10*3/uL (ref 1.4–7.7)
Neutrophils Relative %: 58.8 % (ref 43.0–77.0)
Platelets: 225 10*3/uL (ref 150.0–400.0)
RBC: 4.3 Mil/uL (ref 3.87–5.11)
RDW: 14.4 % (ref 11.5–15.5)
WBC: 8.1 10*3/uL (ref 4.0–10.5)

## 2023-01-04 LAB — BASIC METABOLIC PANEL
BUN: 15 mg/dL (ref 6–23)
CO2: 28 meq/L (ref 19–32)
Calcium: 9.5 mg/dL (ref 8.4–10.5)
Chloride: 106 meq/L (ref 96–112)
Creatinine, Ser: 1.07 mg/dL (ref 0.40–1.20)
GFR: 54.55 mL/min — ABNORMAL LOW (ref >60.00)
Glucose, Bld: 91 mg/dL (ref 70–99)
Potassium: 3.6 meq/L (ref 3.5–5.1)
Sodium: 141 meq/L (ref 135–145)

## 2023-01-04 LAB — LIPID PANEL
Cholesterol: 184 mg/dL (ref 0–200)
HDL: 61.2 mg/dL (ref >39.00)
LDL Cholesterol: 91 mg/dL (ref 0–99)
NonHDL: 122.86
Total CHOL/HDL Ratio: 3
Triglycerides: 161 mg/dL — ABNORMAL HIGH (ref 0.0–149.0)
VLDL: 32.2 mg/dL (ref 0.0–40.0)

## 2023-01-04 LAB — VITAMIN D 25 HYDROXY (VIT D DEFICIENCY, FRACTURES): VITD: 16.38 ng/mL — ABNORMAL LOW (ref 30.00–100.00)

## 2023-01-04 LAB — HEPATIC FUNCTION PANEL
ALT: 13 U/L (ref 0–35)
AST: 19 U/L (ref 0–37)
Albumin: 4.5 g/dL (ref 3.5–5.2)
Alkaline Phosphatase: 81 U/L (ref 39–117)
Bilirubin, Direct: 0.1 mg/dL (ref 0.0–0.3)
Total Bilirubin: 0.5 mg/dL (ref 0.2–1.2)
Total Protein: 7.5 g/dL (ref 6.0–8.3)

## 2023-01-04 LAB — TSH: TSH: 0.76 u[IU]/mL (ref 0.35–5.50)

## 2023-01-04 MED ORDER — SIMVASTATIN 20 MG PO TABS: 20.0000 mg | ORAL_TABLET | Freq: Every day | ORAL | 1 refills | Status: DC

## 2023-01-04 MED ORDER — AMLODIPINE BESYLATE 10 MG PO TABS: 10.0000 mg | ORAL_TABLET | Freq: Every day | ORAL | 1 refills | Status: DC

## 2023-01-04 MED ORDER — LEVOTHYROXINE SODIUM 50 MCG PO TABS: ORAL_TABLET | ORAL | 1 refills | Status: DC

## 2023-01-04 MED ORDER — ATENOLOL 50 MG PO TABS: 100.0000 mg | ORAL_TABLET | Freq: Every day | ORAL | 1 refills | Status: DC

## 2023-01-04 NOTE — Progress Notes (Signed)
   Subjective:    Patient ID: Jamie Kelley, female    DOB: 01/01/1956, 67 y.o.   MRN: [id number]  HPI CPE- UTD on mammo, Tdap, flu.  Due for cologuard.  Will get PNA vaccine today  Patient Care Team    Relationship Specialty Notifications Start End  Sheliah Hatch, MD PCP - General Family Medicine  05/29/12    Comment: Merged    Health Maintenance  Topic Date Due   Fecal DNA (Cologuard)  07/06/2022   Cervical Cancer Screening (HPV/Pap Cotest)  02/26/2023 (Originally 08/12/1987)   Pneumonia Vaccine 53+ Years old (1 of 1 - PCV) 02/26/2023 (Originally 08/12/2022)   DEXA SCAN  02/26/2023 (Originally 08/12/2022)   MAMMOGRAM  03/14/2024   DTaP/Tdap/Td (2 - Td or Tdap) 06/10/2030   INFLUENZA VACCINE  Completed   Hepatitis C Screening  Completed   HIV Screening  Completed   HPV VACCINES  Aged Out   COVID-19 Vaccine  Discontinued   Zoster Vaccines- Shingrix  Discontinued      Review of Systems Patient reports no vision/ hearing changes, adenopathy,fever, weight change,  persistant/recurrent hoarseness , swallowing issues, chest pain, palpitations, edema, persistant/recurrent cough, hemoptysis, dyspnea (rest/exertional/paroxysmal nocturnal), gastrointestinal bleeding (melena, rectal bleeding), abdominal pain, significant heartburn, bowel changes, GU symptoms (dysuria, hematuria, incontinence), Gyn symptoms (abnormal  bleeding, pain),  syncope, focal weakness, memory loss, numbness & tingling, skin/hair/nail changes, abnormal bruising or bleeding, anxiety, or depression.     Objective:   Physical Exam General Appearance:    Alert, cooperative, no distress, appears stated age  Head:    Normocephalic, without obvious abnormality, atraumatic  Eyes:    PERRL, conjunctiva/corneas clear, EOM's intact both eyes  Ears:    Normal TM's and external ear canals, both ears  Nose:   Nares normal, septum midline, mucosa normal, no drainage    or sinus tenderness  Throat:   Lips, mucosa, and tongue normal;  teeth and gums normal  Neck:   Supple, symmetrical, trachea midline, no adenopathy;    Thyroid: known nodules  Back:     Symmetric, no curvature, ROM normal, no CVA tenderness  Lungs:     Clear to auscultation bilaterally, respirations unlabored  Chest Wall:    No tenderness or deformity   Heart:    Regular rate and rhythm, S1 and S2 normal, no murmur, rub   or gallop  Breast Exam:    Deferred to mammo  Abdomen:     Soft, non-tender, bowel sounds active all four quadrants,    no masses, no organomegaly  Genitalia:    Deferred  Rectal:    Extremities:   Extremities normal, atraumatic, no cyanosis or edema  Pulses:   2+ and symmetric all extremities  Skin:   Skin color, texture, turgor normal, no rashes or lesions  Lymph nodes:   Cervical, supraclavicular, and axillary nodes normal  Neurologic:   CNII-XII intact, normal strength, sensation and reflexes    throughout          Assessment & Plan:

## 2023-01-04 NOTE — Assessment & Plan Note (Signed)
Check labs and replete prn. 

## 2023-01-04 NOTE — Assessment & Plan Note (Signed)
Pt's PE WNL w/ exception of BMI and known thyroid nodules.  UTD on mammo, Tdap, flu.  Due for cologuard- ordered.  Prevnar 20 given.  Check labs.  Anticipatory guidance provided.

## 2023-01-04 NOTE — Patient Instructions (Addendum)
Follow up in 6 months to recheck BP and cholesterol We'll notify you of your lab results and make any changes if needed Continue to work on healthy diet and regular exercise- you're doing great!!! Complete the Cologuard and return as directed Call with any questions or concerns Stay Safe!  Stay Healthy! Happy Fall!!!

## 2023-01-07 ENCOUNTER — Telehealth: Payer: Self-pay

## 2023-01-07 MED ORDER — VITAMIN D (ERGOCALCIFEROL) 1.25 MG (50000 UNIT) PO CAPS: 50000.0000 [IU] | ORAL_CAPSULE | ORAL | 0 refills | Status: DC

## 2023-01-07 NOTE — Telephone Encounter (Signed)
Opened in error

## 2023-01-07 NOTE — Telephone Encounter (Signed)
-----   Message from Neena Rhymes sent at 01/07/2023  7:25 AM EST ----- Labs look great w/ exception of low Vit D.  Based on this, we need to start 50,000 units weekly x12 weeks in addition to daily OTC supplement of at least 2000 units.   Your GFR (marker of kidney filtration) is slightly decreased.  Please increase your water intake to improve this number

## 2023-01-07 NOTE — Telephone Encounter (Signed)
Vit D was sen to preferred pharmacy

## 2023-01-07 NOTE — Addendum Note (Signed)
Addended by: Eldred Manges on: 01/07/2023 11:27 AM   Modules accepted: Orders

## 2023-01-08 NOTE — Telephone Encounter (Signed)
Pt has reviewed via MyChart

## 2023-01-29 ENCOUNTER — Telehealth: Payer: Self-pay | Admitting: Registered Nurse

## 2023-01-29 DIAGNOSIS — I10 Essential (primary) hypertension: Secondary | ICD-10-CM

## 2023-01-29 MED ORDER — ATENOLOL 50 MG PO TABS: 50.0000 mg | ORAL_TABLET | Freq: Every day | ORAL | 0 refills | Status: DC

## 2023-01-29 MED ORDER — AMLODIPINE BESYLATE 10 MG PO TABS: 10.0000 mg | ORAL_TABLET | Freq: Every day | ORAL | Status: DC

## 2023-01-29 NOTE — Telephone Encounter (Signed)
Patient last filled amlodipine 10/04/2022; atenolol 10/04/22 90 day supplies each from [facility].  Saw PCM 06/29/22   Last labs   Latest Reference Range & Units 12/19/21 09:39  Sodium 135 - 145 mEq/L 139  Potassium 3.5 - 5.1 mEq/L 3.8  Chloride 96 - 112 mEq/L 104  CO2 19 - 32 mEq/L 29  Glucose 70 - 99 mg/dL 93  BUN 6 - 23 mg/dL 11  Creatinine 3.08 - 6.57 mg/dL 8.46  Calcium 8.4 - 96.2 mg/dL 9.2  Alkaline Phosphatase 39 - 117 U/L 74  Albumin 3.5 - 5.2 g/dL 4.3  AST 0 - 37 U/L 18  ALT 0 - 35 U/L 11  Total Protein 6.0 - 8.3 g/dL 7.3  Bilirubin, Direct 0.0 - 0.3 mg/dL 0.1  Total Bilirubin 0.2 - 1.2 mg/dL 0.5  GFR >95.28 mL/min 95.68  Total CHOL/HDL Ratio   3  Cholesterol 0 - 200 mg/dL 413  HDL Cholesterol >24.40 mg/dL 10.27  LDL (calc) 0 - 99 mg/dL 253 (H)  NonHDL   664.40  Triglycerides 0.0 - 149.0 mg/dL 34.7  VLDL 0.0 - 42.5 mg/dL 95.6  WBC 4.0 - 38.7 K/uL 6.2  RBC 3.87 - 5.11 Mil/uL 4.53  Hemoglobin 12.0 - 15.0 g/dL 56.4  HCT 33.2 - 95.1 % 40.3  MCV 78.0 - 100.0 fl 88.9  MCHC 30.0 - 36.0 g/dL 88.4  RDW 16.6 - 06.3 % 14.4  Platelets 150.0 - 400.0 K/uL 185.0  Neutrophils 43.0 - 77.0 % 54.9  Lymphocytes 12.0 - 46.0 % 35.8  Monocytes Relative 3.0 - 12.0 % 7.0  Eosinophil 0.0 - 5.0 % 1.9  Basophil 0.0 - 3.0 % 0.4  NEUT# 1.4 - 7.7 K/uL 3.4  Lymphocyte # 0.7 - 4.0 K/uL 2.2  Monocyte # 0.1 - 1.0 K/uL 0.4  Eosinophils Absolute 0.0 - 0.7 K/uL 0.1  Basophils Absolute 0.0 - 0.1 K/uL 0.0  TSH 0.35 - 5.50 uIU/mL 1.27  (H): Data is abnormally high   Dispensed 90 day supply atenolol 50mg  and amlodipine 10mg  to patient today from [facility].  Patient verbalized understanding information/instructions, agreed with plan of care and had no furher questions at this time.

## 2023-02-22 NOTE — Telephone Encounter (Signed)
Copied from CRM [id number]. Topic: Clinical - Medication Question >> Feb 22, 2023  8:01 AM Jamie Kelley wrote: Reason for CRM: Pt wants to know if she can be prescribed something for a bad cold. The pt originally wanted to be seen but  there are no open slots until January and the pt doesn't think she can wait that long

## 2023-02-23 ENCOUNTER — Ambulatory Visit
Admission: EM | Admit: 2023-02-23 | Discharge: 2023-02-23 | Disposition: A | Discharge status: Home / Self Care | Payer: No Typology Code available for payment source | Attending: Internal Medicine | Admitting: Internal Medicine

## 2023-02-23 ENCOUNTER — Ambulatory Visit (INDEPENDENT_AMBULATORY_CARE_PROVIDER_SITE_OTHER): Payer: No Typology Code available for payment source

## 2023-02-23 DIAGNOSIS — J209 Acute bronchitis, unspecified: Secondary | ICD-10-CM | POA: Diagnosis not present

## 2023-02-23 DIAGNOSIS — R051 Acute cough: Secondary | ICD-10-CM | POA: Diagnosis not present

## 2023-02-23 MED ORDER — PREDNISONE 20 MG PO TABS: 40.0000 mg | ORAL_TABLET | Freq: Every day | ORAL | 0 refills | Status: AC

## 2023-02-23 MED ORDER — AZITHROMYCIN 250 MG PO TABS: 250.0000 mg | ORAL_TABLET | Freq: Every day | ORAL | 0 refills | Status: DC

## 2023-02-23 MED ORDER — PROMETHAZINE-DM 6.25-15 MG/5ML PO SYRP: 5.0000 mL | ORAL_SOLUTION | Freq: Three times a day (TID) | ORAL | 0 refills | Status: DC | PRN

## 2023-02-23 NOTE — ED Triage Notes (Signed)
Pt presents to UC for c/o cough and body aches x1 week. Cough has become productive.

## 2023-02-23 NOTE — ED Provider Notes (Signed)
[facility]    CSN: [id number] Arrival date & time: 02/23/23  0803      History   Chief Complaint No chief complaint on file.   HPI Jamie Kelley is a 67 y.o. female  presents for evaluation of URI symptoms for 7 days. Patient reports associated symptoms of adductive cough, body aches, some shortness of breath. Denies N/V/D, fevers, sore throat, ear pain, congestion. Patient does not have a hx of asthma. Patient not not an active smoker.     Pt has taken cough medicine OTC for symptoms. Pt has no other concerns at this time.   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/2021   Cough 11/16/2021   Idiopathic urticaria 11/16/2021   Thyroid nodule greater than or equal to 1.5 cm in diameter incidentally noted on imaging study 12/09/2020   Closed fracture of lateral malleolus of left fibula 09/07/2020   Colitis 12/27/2019   BRBPR (bright red blood per rectum) 12/26/2019   Morbid obesity (HCC) 12/10/2019   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   Pain in right knee 08/19/2019   Physical exam 06/05/2019   Vitamin D deficiency 06/05/2019   Depression 12/25/2017   Contact dermatitis 07/06/2015   Thyromegaly 10/22/2014   Hyperlipidemia 10/22/2014   HTN (hypertension) 05/29/2012   Hypothyroidism 05/29/2012   Arthritis of both knees 05/29/2012    Past Surgical History:  Procedure Laterality Date   BIOPSY THYROID     Dr Pollyann Kennedy- negative for cancer   COLONOSCOPY     20's   ORIF ANKLE FRACTURE Left 09/22/2020   Procedure: OPEN REDUCTION INTERNAL FIXATION (ORIF) left lateral malleolus fracturre;  Surgeon: Toni Arthurs, MD;  Location: [facility];   Service: Orthopedics;  Laterality: Left;   SYNDESMOSIS REPAIR Left 09/22/2020   Procedure: Internal fixation syndesmosis;  Surgeon: Toni Arthurs, MD;  Location: [facility];  Service: Orthopedics;  Laterality: Left;    OB History   No obstetric history on file.      Home Medications    Prior to Admission medications   Medication Sig Start Date End Date Taking? Authorizing Provider  azithromycin (ZITHROMAX) 250 MG tablet Take 1 tablet (250 mg total) by mouth daily. Take first 2 tablets together, then 1 every day until finished. 02/26/23  Yes Radford Pax, NP  predniSONE (DELTASONE) 20 MG tablet Take 2 tablets (40 mg total) by mouth daily with breakfast for 5 days. 02/23/23 02/28/23 Yes Radford Pax, NP  promethazine-dextromethorphan (PROMETHAZINE-DM) 6.25-15 MG/5ML syrup Take 5 mLs by mouth 3 (three) times daily as needed for cough. 02/23/23  Yes Radford Pax, NP  amLODipine (NORVASC) 10 MG tablet Take 1 tablet (10 mg total) by mouth daily. 01/04/23   Sheliah Hatch, MD  amLODipine (NORVASC) 10 MG tablet Take 1 tablet (10 mg total) by mouth daily. 01/29/23   Betancourt, Jarold Song, NP  atenolol (TENORMIN) 50 MG tablet Take 2 tablets (100 mg total) by mouth daily. 01/04/23   Sheliah Hatch, MD  atenolol (TENORMIN) 50 MG tablet Take 1 tablet (50 mg total) by mouth daily. 01/29/23 04/29/23  Betancourt, Tina A, NP  hydrOXYzine (ATARAX) 25 MG tablet Take 1 tablet by mouth 3 (three) times daily.    [provider]  ibuprofen (ADVIL) 800 MG tablet Take 1 tablet (800 mg total) by mouth 3 (three) times daily. 05/10/22   Betancourt, Jarold Song, NP  levocetirizine (XYZAL) 5 MG tablet Take 5 mg by mouth 2 (two) times daily.    [provider]  levothyroxine (SYNTHROID) 50 MCG tablet TAKE 1/2 (ONE-HALF) TABLET BY MOUTH ONCE DAILY BEFORE BREAKFAST 01/04/23   Sheliah Hatch, MD  montelukast (SINGULAIR) 10 MG tablet Take 10 mg by mouth at bedtime.    [provider]   simvastatin (ZOCOR) 20 MG tablet Take 1 tablet (20 mg total) by mouth at bedtime. 01/04/23   Sheliah Hatch, MD  Vitamin D, Ergocalciferol, (DRISDOL) 1.25 MG (50000 UNIT) CAPS capsule Take 1 capsule (50,000 Units total) by mouth every 7 (seven) days. 01/07/23   Sheliah Hatch, MD    Family History Family History  Problem Relation Age of Onset   Heart disease Mother    Hypertension Mother    Diabetes Mother    Alzheimer's disease Mother    Cancer Maternal Aunt        breast   Heart disease Maternal Aunt    Hypertension Maternal Aunt    Breast cancer Maternal Aunt 58   Hypertension Maternal Uncle    Heart disease Maternal Uncle    Heart disease Maternal Grandmother    Hypertension Maternal Grandmother    Diabetes Maternal Grandmother    Colon cancer Neg Hx    Esophageal cancer Neg Hx    Colon polyps Neg Hx     Social History Social History   Tobacco Use   Smoking status: Never   Smokeless tobacco: Never  Vaping Use   Vaping status: Never Used  Substance Use Topics   Alcohol use: No   Drug use: No     Allergies   Patient has no known allergies.   Review of Systems Review of Systems  Respiratory:  Positive for cough and shortness of breath.   Musculoskeletal:  Positive for myalgias.     Physical Exam Triage Vital Signs ED Triage Vitals  Encounter Vitals Group     BP 02/23/23 0833 120/79     Systolic BP Percentile --      Diastolic BP Percentile --      Pulse Rate 02/23/23 0833 62     Resp 02/23/23 0833 16     Temp 02/23/23 0833 99.1 F (37.3 C)     Temp Source 02/23/23 0833 Oral     SpO2 02/23/23 0833 94 %     Weight --      Height --      Head Circumference --      Peak Flow --      Pain Score 02/23/23 0831 0     Pain Loc --      Pain Education --      Exclude from Growth Chart --    No data found.  Updated Vital Signs BP 120/79 (BP Location: Left Arm)   Pulse 62   Temp 99.1 F (37.3 C) (Oral)   Resp 16   LMP 03/10/2012   SpO2  94%   Visual Acuity Right Eye Distance:   Left Eye Distance:   Bilateral Distance:    Right Eye Near:   Left Eye Near:    Bilateral Near:     Physical Exam  Vitals and nursing note reviewed.  Constitutional:      General: She is not in acute distress.    Appearance: She is well-developed. She is not ill-appearing.  HENT:     Head: Normocephalic and atraumatic.     Right Ear: Tympanic membrane and ear canal normal.     Left Ear: Tympanic membrane and ear canal normal.     Nose: Rhinorrhea present. No congestion.     Mouth/Throat:     Mouth: Mucous membranes are moist.     Pharynx: Oropharynx is clear. Uvula midline. No posterior oropharyngeal erythema.     Tonsils: No tonsillar exudate or tonsillar abscesses.  Eyes:     Conjunctiva/sclera: Conjunctivae normal.     Pupils: Pupils are equal, round, and reactive to light.  Cardiovascular:     Rate and Rhythm: Normal rate and regular rhythm.     Heart sounds: Normal heart sounds.  Pulmonary:     Effort: Pulmonary effort is normal.     Breath sounds: Normal breath sounds.     Comments: Slightly diminished in RLL Musculoskeletal:     Cervical back: Normal range of motion and neck supple.  Lymphadenopathy:     Cervical: No cervical adenopathy.  Skin:    General: Skin is warm and dry.  Neurological:     General: No focal deficit present.     Mental Status: She is alert and oriented to person, place, and time.  Psychiatric:        Mood and Affect: Mood normal.        Behavior: Behavior normal.      UC Treatments / Results  Labs (all labs ordered are listed, but only abnormal results are displayed) Labs Reviewed - No data to display  EKG   Radiology DG Chest 2 View Result Date: 02/23/2023 CLINICAL DATA:  67 year old female with history of cough for 1 week. Shortness of breath. EXAM: CHEST - 2 VIEW COMPARISON:  Chest x-ray 01/02/2020. FINDINGS: Lung volumes are normal. No consolidative airspace disease. No pleural  effusions. No pneumothorax. No pulmonary nodule or mass noted. Pulmonary vasculature and the cardiomediastinal silhouette are within normal limits. IMPRESSION: No radiographic evidence of acute cardiopulmonary disease. Electronically Signed   By: Trudie Reed M.D.   On: 02/23/2023 08:50    Procedures Procedures (including critical care time)  Medications Ordered in UC Medications - No data to display  Initial Impression / Assessment and Plan / UC Course  I have reviewed the triage vital signs and the nursing notes.  Pertinent labs & imaging results that were available during my care of the patient were reviewed by me and considered in my medical decision making (see chart for details).     Reviewed exam and symptoms with patient.  No red flags.  Chest x-ray negative for pneumonia.  Patient presenting with 1 week of cough.  Will do prednisone for 5 days and Promethazine DM as needed for cough.  Provisional prescription for Zithromax provided with instruction not to take unless symptoms do not improve or worsen by 12/31 and she verbalized understanding.  PCP follow-up as symptoms do not improve.  ER precautions reviewed. Final Clinical Impressions(s) / UC Diagnoses   Final diagnoses:  Acute cough  Acute bronchitis, unspecified organism     Discharge Instructions      Start daily for 5 days.  Promethazine DM as needed for cough.  Please note this medication can make you drowsy.  Do not drink alcohol or drive on this  medication.  A provisional prescription for Zithromax has been provided.  Please do not take unless your symptoms do not improve or worsen by 12/31.  Lots of rest and fluids.  Please follow-up with your PCP if your symptoms do not improve.  Please go to the ER if you develop any worsening symptoms.  I hope you feel better soon!     ED Prescriptions     Medication Sig Dispense Auth. Provider   predniSONE (DELTASONE) 20 MG tablet Take 2 tablets (40 mg total) by mouth  daily with breakfast for 5 days. 10 tablet Radford Pax, NP   promethazine-dextromethorphan (PROMETHAZINE-DM) 6.25-15 MG/5ML syrup Take 5 mLs by mouth 3 (three) times daily as needed for cough. 118 mL Radford Pax, NP   azithromycin (ZITHROMAX) 250 MG tablet Take 1 tablet (250 mg total) by mouth daily. Take first 2 tablets together, then 1 every day until finished. 6 tablet Radford Pax, NP      PDMP not reviewed this encounter.   Radford Pax, NP 02/23/23 [phone]

## 2023-02-23 NOTE — Discharge Instructions (Signed)
Start daily for 5 days.  Promethazine DM as needed for cough.  Please note this medication can make you drowsy.  Do not drink alcohol or drive on this medication.  A provisional prescription for Zithromax has been provided.  Please do not take unless your symptoms do not improve or worsen by 12/31.  Lots of rest and fluids.  Please follow-up with your PCP if your symptoms do not improve.  Please go to the ER if you develop any worsening symptoms.  I hope you feel better soon!

## 2023-02-24 ENCOUNTER — Telehealth: Payer: Self-pay | Admitting: Registered Nurse

## 2023-02-24 ENCOUNTER — Encounter: Payer: Self-pay | Admitting: Registered Nurse

## 2023-02-24 DIAGNOSIS — J209 Acute bronchitis, unspecified: Secondary | ICD-10-CM

## 2023-02-24 NOTE — Telephone Encounter (Signed)
Patient contacted NP stated not going into work tomorrow as was seen by urgent care and diagnosed with bronchitis.  Has inhaler at home used twice today.  Mucous yellow and thinning out today since starting prednisone.  Was also given promethazine dextromethorphan cough syrup rx.  Stated sometimes shortness of breath/wheezing but improves after inhaler use.  Has not tried honey for cough.  Has rx for azithromycin if cough worsening after prednisone finished.  Discussed with patient viral illnesses circulating in community.  Typically mucous clear to yellow to green then done.  Lasting 10-14 days typically.  Some walking pneumonia has been seen in clinic in the last month.  NP in clinic Tuesday 9-12.  Clinic closed Wed due to Paul New Years and Thursday 11-2 NP in clinic and RN in clinic 8a-5p  clinic closed Fri-Sun.  Patient A&Ox3 spoke full sentences without difficulty.  Voice low energy today.  Discussed rest and hydration.  Using albuterol inhaler 1 puff wait 5 minutes then do second puff.  May use prn every 4-6 hours prn protracted cough/wheezing/chest tightness.  Discussed if opaque tan/chunky mucous start azithromycin or worsening cough later this week after 5 days of prednisone 40mg  daily with breakfast.  Patient has work note from Urgent Care was unable to get in with PCM.  Patient verbalized understanding information/instructions, agreed with plan of care and had no further questions at this time.

## 2023-02-26 ENCOUNTER — Encounter: Payer: Self-pay | Admitting: Registered Nurse

## 2023-02-26 ENCOUNTER — Ambulatory Visit: Payer: Self-pay | Admitting: Registered Nurse

## 2023-02-26 VITALS — BP 139/73 | HR 68 | Temp 97.6°F | Resp 16

## 2023-02-26 DIAGNOSIS — J209 Acute bronchitis, unspecified: Secondary | ICD-10-CM

## 2023-02-26 NOTE — Progress Notes (Signed)
 Subjective:    Patient ID: Jamie Kelley, female    DOB: [birth year], 67 y.o.   MRN: [id number]  67y/o african american female established patient here for re-evaluation bronchitis after appt with PCM started on prednisone  taper.  Has not started azithromycin .  Jittery last night and trouble falling asleep due to prednisone .  Fatigued at work today.  Some cough  prednisone  is helping last used albuterol  inhaler yesterday.  Mucous clear today.  Denied fever/chills today/vomiting or diarrhea.     Review of Systems  Constitutional:  Positive for fatigue. Negative for chills, diaphoresis and fever.  HENT:  Positive for congestion and postnasal drip. Negative for trouble swallowing and voice change.   Eyes:  Negative for photophobia and visual disturbance.  Respiratory:  Positive for cough. Negative for shortness of breath and stridor.   Cardiovascular:  Negative for chest pain and palpitations.  Gastrointestinal:  Negative for diarrhea, nausea and vomiting.  Genitourinary:  Negative for difficulty urinating.  Musculoskeletal:  Positive for myalgias. Negative for neck pain and neck stiffness.  Skin:  Negative for color change and rash.  Neurological:  Negative for dizziness, tremors, syncope, facial asymmetry, weakness, light-headedness and headaches.  Hematological:  Negative for adenopathy. Does not bruise/bleed easily.  Psychiatric/Behavioral:  Positive for sleep disturbance. Negative for agitation and confusion.        Objective:   Physical Exam Vitals and nursing note reviewed.  Constitutional:      General: She is not in acute distress.    Appearance: She is well-developed. She is obese. She is ill-appearing. She is not toxic-appearing or diaphoretic.  HENT:     Head: Normocephalic and atraumatic.     Jaw: There is normal jaw occlusion.     Salivary Glands: Right salivary gland is not diffusely enlarged or tender. Left salivary gland is not diffusely enlarged or tender.     Right  Ear: Hearing, ear canal and external ear normal. No decreased hearing noted. No laceration, drainage, swelling or tenderness. A middle ear effusion is present. There is no impacted cerumen.     Left Ear: Hearing, ear canal and external ear normal. No decreased hearing noted. No laceration, drainage, swelling or tenderness. A middle ear effusion is present. There is no impacted cerumen.     Nose: Congestion and rhinorrhea present. Rhinorrhea is clear.     Right Nostril: No epistaxis.     Left Nostril: No epistaxis.     Right Turbinates: Enlarged and swollen. Not pale.     Left Turbinates: Enlarged and swollen. Not pale.     Right Sinus: No maxillary sinus tenderness or frontal sinus tenderness.     Left Sinus: No maxillary sinus tenderness or frontal sinus tenderness.     Mouth/Throat:     Lips: Pink. No lesions.     Mouth: Mucous membranes are moist. No injury, oral lesions or angioedema.     Dentition: No gum lesions.     Tongue: No lesions. Tongue does not deviate from midline.     Palate: No mass and lesions.     Pharynx: Uvula midline. Pharyngeal swelling, posterior oropharyngeal erythema and postnasal drip present. No oropharyngeal exudate or uvula swelling.     Tonsils: No tonsillar exudate.     Comments: Cobblestoning posterior pharynx; bilateral allergic shiners; nasal congestion and sniffing observed in clinic Eyes:     General: Lids are normal. Vision grossly intact. Gaze aligned appropriately. Allergic shiner present. No scleral icterus.       Right  eye: No discharge.        Left eye: No discharge.     Extraocular Movements: Extraocular movements intact.     Conjunctiva/sclera: Conjunctivae normal.     Pupils: Pupils are equal, round, and reactive to light.  Neck:     Trachea: Trachea and phonation normal.  Cardiovascular:     Rate and Rhythm: Normal rate and regular rhythm.     Pulses: Normal pulses.          Radial pulses are 2+ on the right side and 2+ on the left side.      Heart sounds: Normal heart sounds, S1 normal and S2 normal. No murmur heard.    No friction rub. No gallop.  Pulmonary:     Effort: Pulmonary effort is normal. No respiratory distress.     Breath sounds: Normal breath sounds and air entry. No stridor. No wheezing, rhonchi or rales.     Comments: No cough observed in clinic; spoke full sentences without difficulty  Abdominal:     Palpations: Abdomen is soft.  Musculoskeletal:        General: Normal range of motion.     Right hand: Normal strength. Normal capillary refill.     Left hand: Normal strength. Normal capillary refill.     Cervical back: Normal range of motion and neck supple. No swelling, edema, deformity, erythema, signs of trauma, lacerations, rigidity, spasms, torticollis, tenderness or crepitus. No pain with movement. Normal range of motion.     Thoracic back: No swelling, edema, deformity, signs of trauma, lacerations, spasms or tenderness. Normal range of motion.     Right lower leg: No edema.     Left lower leg: No edema.  Lymphadenopathy:     Head:     Right side of head: No submental, submandibular, tonsillar, preauricular, posterior auricular or occipital adenopathy.     Left side of head: No submental, submandibular, tonsillar, preauricular, posterior auricular or occipital adenopathy.     Cervical: No cervical adenopathy.     Right cervical: No superficial, deep or posterior cervical adenopathy.    Left cervical: No superficial, deep or posterior cervical adenopathy.  Skin:    General: Skin is warm and dry.     Capillary Refill: Capillary refill takes less than 2 seconds.     Coloration: Skin is not ashen, cyanotic, jaundiced, mottled, pale or sallow.     Findings: No abrasion, abscess, acne, bruising, burn, ecchymosis, erythema, signs of injury, laceration, lesion, petechiae, rash or wound.     Nails: There is no clubbing.     Comments: Face/neck/hands visually inspected  Neurological:     General: No focal  deficit present.     Mental Status: She is alert and oriented to person, place, and time. Mental status is at baseline.     GCS: GCS eye subscore is 4. GCS verbal subscore is 5. GCS motor subscore is 6.     Cranial Nerves: No cranial nerve deficit, dysarthria or facial asymmetry.     Sensory: Sensation is intact. No sensory deficit.     Motor: Motor function is intact. No weakness, tremor, atrophy, abnormal muscle tone or seizure activity.     Coordination: Coordination is intact. Coordination normal.     Gait: Gait normal.     Comments: In/out of chair without difficulty; gait sure and steady in clinic; bilateral hand grasp equal 5/5  Psychiatric:        Attention and Perception: Attention and perception normal.  Mood and Affect: Mood and affect normal.        Speech: Speech normal.        Behavior: Behavior normal. Behavior is cooperative.        Thought Content: Thought content normal.        Cognition and Memory: Cognition and memory normal.        Judgment: Judgment normal.           Assessment & Plan:  A-acute bronchitis  P-Discussed if not tolerating 40mg  prednisone  daily can decrease to 20mg  as 2 days remaining and did not sleep well last night.  Discussed use albuterol  inhaler 2p am and pm as sp02 still not at baseline.  Breath sounds clear today coarse.  Given 1 bottle nasal saline for use at work and consider claritin 10mg  po daily to help with post nasal drip given 2 UD from clinic stock.  Patient may use normal saline nasal spray 2 sprays each nostril q2h wa as needed. flonase  50mcg 1 spray each nostril BID OTC  Cough lozenges po q2h prn cough given 8 UD from clinic stock.  Prenisone discussed possible side effects increased/decreased appetite, difficulty sleeping, increased blood sugar, increased blood pressure and heart rate.  Albuterol  MDI 90mcg 1-2 puffs po q4-6h prn protracted cough/wheeze/chest tightness possible side effects hand tremor/ increased heart rate.  Bronchitis simple, community acquired, may have started as viral (probably respiratory syncytial, parainfluenza, influenza, or adenovirus), but now evidence of acute purulent bronchitis with resultant bronchial edema and mucus formation.  Viruses are the most common cause of bronchial inflammation in otherwise healthy adults with acute bronchitis.  The appearance of sputum is not predictive of whether a bacterial infection is present.  Purulent sputum is most often caused by viral infections.  There are a small portion of those caused by non-viral agents being Mycoplama pneumonia.  Microscopic examination or C&S of sputum in the healthy adult with acute bronchitis is generally not helpful (usually negative or normal respiratory flora) other considerations being cough from upper respiratory tract infections, sinusitis or allergic syndromes (mild asthma or viral pneumonia).  Differential Diagnoses:  reactive airway disease (asthma, allergic aspergillosis (eosinophilia), chronic bronchitis, respiratory infection (sinusitis, common cold, pneumonia), congestive heart failure, reflux esophagitis, bronchogenic tumor, aspiration syndromes and/or exposure to pulmonary irritants/smoke. Has azithromycin  from [facility].  Will re-evaluate Thursday to see if any changes sp02/breath sounds.  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  Without high fever, severe dyspnea, lack of physical findings or other risk factors, I will hold on a chest radiograph and CBC at this time.  I discussed that approximately 50% of patients with acute bronchitis have a cough that lasts up to three weeks, and 25% for over a month.  Tylenol  500mg  one to two tablets every four to six hours as needed for fever or myalgias.  Exitcare handout on bronchitis and inhaler use.  ER if hemopthysis, SOB, worst chest pain of life.   Patient verbalized agreement and understanding of treatment plan.  P2:  hand washing and cover cough

## 2023-02-26 NOTE — Patient Instructions (Addendum)
 Acute Bronchitis, Adult  Acute bronchitis is sudden inflammation of the main airways (bronchi) that come off the windpipe (trachea) in the lungs. The swelling causes the airways to get smaller and make more mucus than normal. This can make it hard to breathe and can cause coughing or noisy breathing (wheezing). Acute bronchitis may last several weeks. The cough may last longer. Allergies, asthma, and exposure to smoke may make the condition worse. What are the causes? This condition can be caused by germs and by substances that irritate the lungs, including: Cold and flu viruses. The most common cause of this condition is the virus that causes the common cold. Bacteria. This is less common. Breathing in substances that irritate the lungs, including: Smoke from cigarettes and other forms of tobacco. Dust and pollen. Fumes from household cleaning products, gases, or burned fuel. Indoor or outdoor air pollution. What increases the risk? The following factors may make you more likely to develop this condition: A weak body's defense system, also called the immune system. A condition that affects your lungs and breathing, such as asthma. What are the signs or symptoms? Common symptoms of this condition include: Coughing. This may bring up clear, yellow, or green mucus from your lungs (sputum). Wheezing. Runny or stuffy nose. Having too much mucus in your lungs (chest congestion). Shortness of breath. Aches and pains, including sore throat or chest. How is this diagnosed? This condition is usually diagnosed based on: Your symptoms and medical history. A physical exam. You may also have other tests, including tests to rule out other conditions, such as pneumonia. These tests include: A test of lung function. Test of a mucus sample to look for the presence of bacteria. Tests to check the oxygen level in your blood. Blood tests. Chest X-ray. How is this treated? Most cases of acute  bronchitis clear up over time without treatment. Your health care provider may recommend: Drinking more fluids to help thin your mucus so it is easier to cough up. Taking inhaled medicine (inhaler) to improve air flow in and out of your lungs. Using a vaporizer or a humidifier. These are machines that add water to the air to help you breathe better. Taking a medicine that thins mucus and clears congestion (expectorant). Taking a medicine that prevents or stops coughing (cough suppressant). It is not common to take an antibiotic medicine for this condition. Follow these instructions at home:  Take over-the-counter and prescription medicines only as told by your health care provider. Use an inhaler, vaporizer, or humidifier as told by your health care provider. Take two teaspoons (10 mL) of honey at bedtime to lessen coughing at night. Drink enough fluid to keep your urine pale yellow. Do not use any products that contain nicotine or tobacco. These products include cigarettes, chewing tobacco, and vaping devices, such as e-cigarettes. If you need help quitting, ask your health care provider. Get plenty of rest. Return to your normal activities as told by your health care provider. Ask your health care provider what activities are safe for you. Keep all follow-up visits. This is important. How is this prevented? To lower your risk of getting this condition again: Wash your hands often with soap and water for at least 20 seconds. If soap and water are not available, use hand sanitizer. Avoid contact with people who have cold symptoms. Try not to touch your mouth, nose, or eyes with your hands. Avoid breathing in smoke or chemical fumes. Breathing smoke or chemical fumes will make  your condition worse. Get the flu shot every year. Contact a health care provider if: Your symptoms do not improve after 2 weeks. You have trouble coughing up the mucus. Your cough keeps you awake at night. You have  a fever. Get help right away if you: Cough up blood. Feel pain in your chest. Have severe shortness of breath. Faint or keep feeling like you are going to faint. Have a severe headache. Have a fever or chills that get worse. These symptoms may represent a serious problem that is an emergency. Do not wait to see if the symptoms will go away. Get medical help right away. Call your local emergency services (911 in the [place] Do not drive yourself to the hospital. Summary Acute bronchitis is inflammation of the main airways (bronchi) that come off the windpipe (trachea) in the lungs. The swelling causes the airways to get smaller and make more mucus than normal. Drinking more fluids can help thin your mucus so it is easier to cough up. Take over-the-counter and prescription medicines only as told by your health care provider. Do not use any products that contain nicotine or tobacco. These products include cigarettes, chewing tobacco, and vaping devices, such as e-cigarettes. If you need help quitting, ask your health care provider. Contact a health care provider if your symptoms do not improve after 2 weeks. This information is not intended to replace advice given to you by your health care provider. Make sure you discuss any questions you have with your health care provider. Document Revised: 05/25/2021 Document Reviewed: 06/15/2020 Elsevier Patient Education  2024 [employer] Albuterol  Metered Dose Inhaler (MDI) What is this medication? ALBUTEROL  (al PAULLA berneda bowers) treats lung diseases, such as asthma, where the airways in the lungs narrow, causing breathing problems or wheezing (bronchospasm). It is also used to treat asthma or prevent breathing problems during exercise. It works by opening the airways of the lungs, making it easier to breathe. It is often called a rescue or quick-relief medication. This medicine may be used for other purposes; ask your health care provider or pharmacist if you  have questions. COMMON BRAND NAME(S): Proair  HFA, Proventil , Proventil  HFA, Respirol, Ventolin , Ventolin  HFA What should I tell my care team before I take this medication? They need to know if you have any of these conditions: Diabetes Heart disease High blood pressure Irregular heartbeat or rhythm Pheochromocytoma Seizures Thyroid  disease An unusual or allergic reaction to albuterol , other medications, foods, dyes, or preservatives Pregnant or trying to get pregnant Breastfeeding How should I use this medication? This medication is inhaled through the mouth. Take it as directed on the prescription label. Do not use it more often than directed. This medication comes with INSTRUCTIONS FOR USE. Ask your pharmacist for directions on how to use this medication. Read the information carefully. Talk to your pharmacist or care team if you have questions. Talk to your care team about the use of this medication in children. While it may be given to children for selected conditions, precautions do apply. Overdosage: If you think you have taken too much of this medicine contact a poison control center or emergency room at once. NOTE: This medicine is only for you. Do not share this medicine with others. What if I miss a dose? If you take this medication on a regular basis, take it as soon as you can. If it is almost time for your next dose, take only that dose. Do not take double or extra doses. What  may interact with this medication? Certain medications for blood pressure, heart disease, irregular heartbeat Certain medications for depression, anxiety, or other mental health conditions Diuretics MAOIs, such as Marplan, Nardil, and Parnate This list may not describe all possible interactions. Give your health care provider a list of all the medicines, herbs, non-prescription drugs, or dietary supplements you use. Also tell them if you smoke, drink alcohol, or use illegal drugs. Some items may interact  with your medicine. What should I watch for while using this medication? Visit your care team for regular checks on your progress. Tell your care team if your symptoms do not start to get better or if they get worse. If your symptoms get worse or if you are using this medication more than normal, call your care team right away. You and your care team should develop an Asthma Action Plan that is just for you. Be sure to know what to do if you are in the yellow (asthma is getting worse) or red (medical alert) zones. Your mouth may get dry. Chewing sugarless gum or sucking hard candy and drinking plenty of water may help. Contact your care team if the problem does not go away or is severe. What side effects may I notice from receiving this medication? Side effects that you should report to your care team as soon as possible: Allergic reactions--skin rash, itching, hives, swelling of the face, lips, tongue, or throat Heart rhythm changes--fast or irregular heartbeat, dizziness, feeling faint or lightheaded, chest pain, trouble breathing Increase in blood pressure Muscle pain or cramps Wheezing or trouble breathing that is worse after use Side effects that usually do not require medical attention (report to your care team if they continue or are bothersome): Change in taste Dry mouth Headache Sore throat Tremors or shaking Trouble sleeping This list may not describe all possible side effects. Call your doctor for medical advice about side effects. You may report side effects to FDA at 1-800-FDA-1088. Where should I keep my medication? Keep out of the reach of children and pets. Store at room temperature between 20 and 25 degrees C (68 and 77 degrees F). Keep inhaler away from extreme heat and cold. Get rid of it when the dose counter reads 0 or after the expiration date, whichever is first. To get rid of medications that are no longer needed or have expired: Take the medication to a medication  take-back program. Check with your pharmacy or law enforcement to find a location. If you cannot return the medication, ask your care team how to get rid of this medication safely. NOTE: This sheet is a summary. It may not cover all possible information. If you have questions about this medicine, talk to your doctor, pharmacist, or health care provider.  2024 Elsevier/Gold Standard (2021-09-04 00:00:00)

## 2023-02-27 NOTE — Telephone Encounter (Signed)
 Patient seen in clinic 02/26/2023 see office note

## 2023-03-12 ENCOUNTER — Other Ambulatory Visit: Payer: Self-pay | Admitting: Family Medicine

## 2023-03-12 DIAGNOSIS — Z1231 Encounter for screening mammogram for malignant neoplasm of breast: Secondary | ICD-10-CM

## 2023-03-26 ENCOUNTER — Ambulatory Visit
Admission: RE | Admit: 2023-03-26 | Discharge: 2023-03-26 | Disposition: A | Discharge status: Home / Self Care | Payer: No Typology Code available for payment source | Source: Ambulatory Visit

## 2023-03-26 DIAGNOSIS — Z1231 Encounter for screening mammogram for malignant neoplasm of breast: Secondary | ICD-10-CM

## 2023-04-09 ENCOUNTER — Ambulatory Visit: Payer: Self-pay | Admitting: Registered Nurse

## 2023-04-09 ENCOUNTER — Encounter: Payer: Self-pay | Admitting: Registered Nurse

## 2023-04-09 VITALS — HR 68 | Temp 98.2°F | Resp 16

## 2023-04-09 DIAGNOSIS — S8011XA Contusion of right lower leg, initial encounter: Secondary | ICD-10-CM

## 2023-04-09 DIAGNOSIS — S80211A Abrasion, right knee, initial encounter: Secondary | ICD-10-CM

## 2023-04-09 DIAGNOSIS — S0083XA Contusion of other part of head, initial encounter: Secondary | ICD-10-CM

## 2023-04-09 DIAGNOSIS — S0990XA Unspecified injury of head, initial encounter: Secondary | ICD-10-CM

## 2023-04-09 DIAGNOSIS — W108XXA Fall (on) (from) other stairs and steps, initial encounter: Secondary | ICD-10-CM

## 2023-04-09 NOTE — Progress Notes (Signed)
Subjective:    Patient ID: Jamie Kelley, female    DOB: November 14, 1955, 68 y.o.   MRN: [id number]  68y/o established african american female reported missed step going downstairs as friend shut off stair lights before she finished walking them and she didn't see last step.  Unsure if head hit wall or step denied loss of consciousness, visual changes, vomiting, seizure but did have swelling on forehead and scraped up her right knee/leg.  Mild headache today and swollen area tender to touch it on forehead.  Has been working today but feels a little slow.  Eating and drinking usual.  Denied loss of bowel/bladder control, saddle paresthesias or arm/leg weakness.  Took ibuprofen for pain that evening and next day for pain and it helped.   Neck/shoulders/arms/legs feel tight today      Review of Systems  Constitutional:  Negative for chills and fever.  HENT:  Negative for trouble swallowing and voice change.   Eyes:  Negative for photophobia, pain, discharge, redness and visual disturbance.  Respiratory:  Negative for cough, choking, shortness of breath, wheezing and stridor.   Cardiovascular:  Negative for chest pain and palpitations.  Gastrointestinal:  Negative for diarrhea, nausea and vomiting.  Genitourinary:  Negative for difficulty urinating and enuresis.  Musculoskeletal:  Negative for neck stiffness.       Objective:   Physical Exam Vitals reviewed.  Constitutional:      General: She is awake. She is not in acute distress.    Appearance: Normal appearance. She is well-developed, well-groomed and overweight. She is not ill-appearing, toxic-appearing or diaphoretic.  HENT:     Head: Normocephalic. Abrasion and contusion present. No raccoon eyes, Battle's sign, masses, right periorbital erythema, left periorbital erythema or laceration. Hair is normal.     Jaw: There is normal jaw occlusion. No trismus, tenderness, swelling, pain on movement or malocclusion.     Salivary Glands: Right  salivary gland is not diffusely enlarged or tender. Left salivary gland is not diffusely enlarged or tender.      Comments: 1cm nummular nonpitting edema (contusion)superior to left eyebrow and distal to hairline midway mild ecchymosis and faint abrasion    Right Ear: Hearing and external ear normal. No decreased hearing noted. No laceration, drainage, swelling or tenderness. A middle ear effusion is present. No foreign body. No mastoid tenderness. No PE tube. No hemotympanum. Tympanic membrane is not injected, scarred, perforated, erythematous, retracted or bulging.     Left Ear: Hearing and external ear normal. No decreased hearing noted. No laceration, drainage, swelling or tenderness. A middle ear effusion is present. No foreign body. No mastoid tenderness. No PE tube. No hemotympanum. Tympanic membrane is not injected, scarred, perforated, erythematous, retracted or bulging.     Ears:     Comments: Soft gold cerumen bilateral auditory canals able to visual central TM air fluid level clear TM intact    Nose: No congestion or rhinorrhea.     Right Nostril: No epistaxis.     Left Nostril: No epistaxis.     Right Turbinates: Not enlarged, swollen or pale.     Left Turbinates: Not enlarged, swollen or pale.     Right Sinus: No maxillary sinus tenderness or frontal sinus tenderness.     Left Sinus: Frontal sinus tenderness present. No maxillary sinus tenderness.     Comments: Overlying and adjacent to contusion left frontal sinus TTP    Mouth/Throat:     Lips: Pink. No lesions.     Mouth: Mucous  membranes are moist. No oral lesions or angioedema.     Dentition: No gum lesions.     Tongue: No lesions. Tongue does not deviate from midline.     Palate: No mass and lesions.     Pharynx: Oropharynx is clear. Uvula midline. No pharyngeal swelling, oropharyngeal exudate, posterior oropharyngeal erythema, uvula swelling or postnasal drip.     Tonsils: No tonsillar exudate.  Eyes:     General: Lids are  normal. Vision grossly intact. Gaze aligned appropriately. No allergic shiner, visual field deficit or scleral icterus.       Right eye: No foreign body or discharge.        Left eye: No foreign body or discharge.     Extraocular Movements: Extraocular movements intact.     Right eye: Normal extraocular motion and no nystagmus.     Left eye: Normal extraocular motion and no nystagmus.     Conjunctiva/sclera: Conjunctivae normal.     Right eye: Right conjunctiva is not injected. No chemosis, exudate or hemorrhage.    Left eye: Left conjunctiva is not injected. No chemosis, exudate or hemorrhage.    Pupils: Pupils are equal, round, and reactive to light.  Neck:     Trachea: Trachea and phonation normal.  Cardiovascular:     Rate and Rhythm: Normal rate and regular rhythm.     Pulses: Normal pulses.          Radial pulses are 2+ on the right side and 2+ on the left side.     Heart sounds: Normal heart sounds, S1 normal and S2 normal.  Pulmonary:     Effort: Pulmonary effort is normal. No respiratory distress.     Breath sounds: Normal breath sounds and air entry. No stridor or transmitted upper airway sounds. No decreased breath sounds, wheezing, rhonchi or rales.     Comments: Spoke full sentences without difficulty; no cough observed in exam room Abdominal:     General: Abdomen is flat.     Palpations: Abdomen is soft.  Musculoskeletal:        General: Swelling, tenderness and signs of injury present. Normal range of motion.     Right elbow: Normal range of motion.     Left elbow: Normal range of motion.     Right forearm: No swelling, edema, deformity, lacerations or tenderness.     Left forearm: No swelling, edema, deformity, lacerations or tenderness.     Right hand: Normal strength. Normal capillary refill.     Left hand: Normal strength. Normal capillary refill.     Cervical back: Normal range of motion and neck supple. No swelling, edema, deformity, erythema, signs of trauma,  lacerations, rigidity, torticollis, tenderness or crepitus. No spinous process tenderness or muscular tenderness. Normal range of motion.     Thoracic back: No swelling, edema, deformity, signs of trauma, lacerations or tenderness. Normal range of motion.     Lumbar back: No swelling, edema, deformity or lacerations.     Right knee: No swelling, deformity, effusion, erythema, ecchymosis, lacerations or crepitus. Normal range of motion.     Left knee: No swelling, deformity, effusion, erythema, ecchymosis, lacerations or crepitus. Normal range of motion.     Right lower leg: Swelling and tenderness present. No deformity, lacerations or bony tenderness. No edema.     Left lower leg: No swelling, deformity, lacerations, tenderness or bony tenderness. No edema.     Right ankle: No swelling, deformity, ecchymosis or lacerations. Normal range of motion.  Left ankle: No swelling, deformity, ecchymosis or lacerations. Normal range of motion.       Legs:     Comments: 2x3cm ecchymosis distal to patella and superior to lateral malleolus right ecchymosis and 0-1+/4 localized edema mildly TTP  Lymphadenopathy:     Head:     Right side of head: No submental, submandibular, tonsillar, preauricular, posterior auricular or occipital adenopathy.     Left side of head: No submental, submandibular, tonsillar, preauricular, posterior auricular or occipital adenopathy.     Cervical: No cervical adenopathy.     Right cervical: No superficial, deep or posterior cervical adenopathy.    Left cervical: No superficial, deep or posterior cervical adenopathy.  Skin:    General: Skin is warm and dry.     Capillary Refill: Capillary refill takes less than 2 seconds.     Coloration: Skin is not ashen, cyanotic, jaundiced, mottled, pale or sallow.     Findings: Abrasion, bruising, ecchymosis and rash present. No abscess, acne, burn, erythema, signs of injury, laceration, lesion, petechiae or wound. Rash is macular. Rash is  not crusting, nodular, papular, purpuric, pustular, scaling, urticarial or vesicular.     Nails: There is no clubbing.       Neurological:     General: No focal deficit present.     Mental Status: She is alert and oriented to person, place, and time. Mental status is at baseline.     GCS: GCS eye subscore is 4. GCS verbal subscore is 5. GCS motor subscore is 6.     Cranial Nerves: No cranial nerve deficit, dysarthria or facial asymmetry.     Sensory: Sensation is intact.     Motor: Motor function is intact. No weakness, tremor, atrophy, abnormal muscle tone or seizure activity.     Coordination: Coordination is intact. Coordination normal.     Gait: Gait is intact. Gait normal.     Deep Tendon Reflexes: Reflexes normal.     Reflex Scores:      Brachioradialis reflexes are 2+ on the right side and 2+ on the left side.      Patellar reflexes are 2+ on the right side and 2+ on the left side.    Comments: In/out of chair and on/off exam table without difficulty; gait sure and steady in clinic; bilateral hand grasp equal 5/5  Psychiatric:        Attention and Perception: Attention and perception normal.        Mood and Affect: Mood and affect normal.        Speech: Speech normal.        Behavior: Behavior normal. Behavior is cooperative.        Thought Content: Thought content normal.        Cognition and Memory: Cognition and memory normal.        Judgment: Judgment normal.     1430 patient seen in [facility] stated tylenol helped with headache and leg pain and feeling better.      Assessment & Plan:   A-fall at home missed step on stairs in dark; contusion face initial encounter, contusion right lower leg initial encounter, abrasion leg; injury of head initial encounter  P-Discussed if visual changes, worst headache of life, vomiting to seek re-evaluation with another provider same day.  May take tylenol 1000mg  po q6h prn prn given 4 UD from clinic stock.  Discussed ibuprofen can  thin blood further and have more bleeding under skin.  Discussed I cannot rule out brain  bleed by physical exam and that would require imaging with her PCM or UC/ED provider if having symptoms of bleeding intracranial e.g. drowsiness middle of day, weakness arm/leg, headache worsening, visual changes, balance issues, seizures, speech problems, difficulty breathing/swallowing.  Discussed may apply ice to forehead 15 minutes QID prn swelling/pain.  Rest after work and may need more sleep or a nap as body healing from fall.  Discussed muscle tightness not unusual after fall usually days 2-3 the worst. Consider epsom salt bath, gentle stretching, heat 15 minutes on tight muscles.  Exitcare handout contusion, head injury.  Patient verbalized understanding information/instructions and had no further questions at this time.  P-Patient was instructed to rest, ice and elevate right leg on break/lunch and when at home the next couple of days.  Epsom salt soaks daily may be helpful for swelling and pain.  Cryotherapy 15 minutes QID prn pain/swelling.   Exitcare handouts on contusion/abrasion  Tylenol 1000mg  po q6h prn pain. Medications as directed. Call or return to clinic as needed if these symptoms worsen or fail to improve as anticipated especially if extremity worsening swelling/cold/blue Patient verbalized agreement and understanding of treatment plan and had no further questions at this time. P2: ROM, injury prevention    Patient was instructed to rest extremities.  avoid pool, lake, hot tub, dirty sink water. May shower apply neosporin or triple antibiotic topical daily. at least daily wash affected areas with soap and water.   Exitcare handout on abrasion. Medications as directed. Call or return to clinic as needed if these symptoms worsen or fail to improve as anticipated. Patient verbalized agreement and understanding of treatment plan. P2: ROM, injury prevention

## 2023-04-09 NOTE — Patient Instructions (Signed)
Head Injury, Adult There are many types of head injuries. Head injuries can be as minor as a small bump, or they can be a serious medical issue. More severe head injuries include: A jarring injury to the brain (concussion). A bruise (contusion) of the brain. This means there is bleeding in the brain that can cause swelling. A cracked skull (skull fracture). Bleeding in the brain that collects, clots, and forms a bump (hematoma). After a head injury, most problems occur within the first 24 hours, but side effects may occur up to 7-10 days after the injury. It is important to watch your condition for any changes. You may need to be observed in the emergency department or urgent care, or you may have to stay in the hospital. What are the causes? There are many causes of a head injury. Serious head injuries may be caused by car crashes, bicycle or motorcycle crashes, sports injuries, falls, or being struck by an object. What are the symptoms? Symptoms of a head injury include a contusion, bump, or bleeding at the site of the injury. Other physical symptoms may include: Headache. Nausea or vomiting. Dizziness. Blurred or double vision. Sensitivity to bright lights or loud noises. Feeling tired. Trouble waking up. Severe symptoms such as: Weakness or numbness on one side of the body. Slurred speech or swallowing problems. Loss of consciousness. Seizures. Mental symptoms may include: Irritability. Confusion and memory problems. Poor attention and concentration. Changes in eating or sleeping habits. Anxiety or depression. How is this diagnosed? This condition is diagnosed based on your symptoms and a physical exam. You may also have imaging tests done, such as a CT scan or an MRI. How is this treated? Treatment for this condition depends on the severity and type of injury you have. The main goal of treatment is to prevent complications and allow the brain time to heal. Mild head injury If  you have a mild head injury, you may be sent home, and treatment may include: Observation. A responsible adult should stay with you for 24 hours after your injury and check on you often. Physical rest. Brain rest. Pain medicines. Severe head injury If you have a severe head injury, treatment may include: Close observation. You may have to stay in the hospital and have: Frequent physical exams. Frequent checks of how your brain and nervous system are working. Your blood pressure and oxygen levels checked. Medicines to relieve pain, prevent seizures, and decrease brain swelling. Airway protection and breathing support. This may include using a ventilator. Monitoring and managing swelling inside the brain. Brain surgery. Surgery may include: Removing a collection of blood or blood clots. Stopping the bleeding. Removing a part of the skull to make room for the brain to swell. Follow these instructions at home: Activity Rest. Avoid activities that are hard or tiring. Make sure you get enough sleep. Let your brain rest by limiting activities that take a lot of thought or attention, such as: Watching TV. Playing memory games and doing puzzles. Job-related work or homework. Working on [employer], [employer], and texting. Avoid activities that could cause another head injury, such as playing sports, until your health care provider approves. Ask your provider when it is safe for you to return to your regular activities, such as work or school. Ask your provider when you can drive, ride a bicycle, or use machinery. Your ability to react may be slower after a brain injury. Do not do these activities if you are dizzy. Lifestyle  Do not drink alcohol until your provider approves. Do not use drugs. Alcohol and certain drugs may slow your recovery and can put you at risk of further injury. If it is hard to remember things, write them down. If you are easily distracted, try to do one thing  at a time. Talk with family members or close friends when making important decisions. Tell your friends, family, a trusted colleague, and work Production designer, theatre/television/film about your injury, symptoms, and restrictions. Ask them to watch for any problems that are new or get worse. General instructions Take over-the-counter and prescription medicines only as told by your provider. Have a responsible adult stay with you for 24 hours after your head injury. They should watch you for any changes in your symptoms and be ready to get help right away. Keep all follow-up visits to make sure your needs are being met and catch any new problems early. How is this prevented? Avoiding another brain injury is very important. In rare cases, another injury can lead to permanent brain damage, brain swelling, or death. The risk of this is greatest during the first 7-10 days after a head injury. To avoid injuries: Improve your balance and strength to avoid falls. Wear a seat belt when you are in a moving vehicle. Wear a helmet when riding a bicycle, skiing, or doing any other sport that has a risk of injury. Take safety measures in your home to prevent falls, such as: Removing clutter and tripping hazards. Using grab bars in bathrooms and handrails by stairs. Placing non-slip mats on floors and in bathtubs. Improving lighting in dim areas. Where to find more information Brain Injury Association: biausa.org Contact a health care provider if: You have headaches that do not go away. You have dizziness that does not go away. You have double vision or vision changes that do not go away. You have difficulty sleeping. You have changes in your mood. You have new symptoms. Get help right away if: You have sudden: Severe headache. Severe vomiting. Unequal pupil size. One is bigger than the other. Vision problems. Confusion or irritability. You have a seizure. Your symptoms get worse. You have clear or bloody fluid coming from your  nose or ears. These symptoms may be an emergency. Get help right away. Call 911. Do not wait to see if the symptoms will go away. Do not drive yourself to the hospital. This information is not intended to replace advice given to you by your health care provider. Make sure you discuss any questions you have with your health care provider. Document Revised: 11/30/2021 Document Reviewed: 11/30/2021 Elsevier Patient Education  2024 [employer] Inc.Abrasion An abrasion is a cut or scrape that affects only the surface of the skin. The injury doesn't go through all the layers of the skin. Still, an abrasion can cause pain because it leaves the skin and its nerves open. Abrasions are usually minor injuries that can be treated at home. You must care for your abrasion to prevent infection. What are the causes? Abrasions happen when something rubs, scrapes, or scratches your skin. This can happen when you fall on a hard or rough surface. Or, when a bush in your yard scratches you. When your skin rubs against something, some layers of skin may come off. You may also see tiny tears on your skin. What are the signs or symptoms? The main symptom of this condition is a cut or a scrape. The cut or scrape may bleed. It may also appear red or  pink. If your abrasion is caused by a fall, there may be a bruise under your cut or scrape. How is this diagnosed? An abrasion is diagnosed with a physical exam. How is this treated? Treatment for this condition depends on how large and deep the abrasion is. In most cases: Your abrasion will be cleaned with water and mild soap. An antibiotic may be put on the wound to prevent infection. A petroleum jelly may be put on the wound to prevent moisture. A bandage may be placed on your abrasion to keep it clean. You may need a tetanus shot. Follow these instructions at home: Medicines Take over-the-counter and prescription medicines only as told by your health care provider. If  you were prescribed antibiotics, use them as told by your provider. Do not stop using them even if you start to feel better. Wound care Clean your wound 1-2 times a day, or as told by your provider. Wash your hands for at least 20 seconds with mild soap and water. Do this before and after you clean your wound. If soap and water are not available, use hand sanitizer to clean your hands. Wash your wound using mild soap and water, a wound cleanser, or a saltwater solution. A saltwater solution is also called saline. Do not use hydrogen peroxide or alcohol. These can slow healing. Rinse off the soap. Pat your wound dry with a clean towel. Do not rub your wound. Put an antibiotic ointment on your wound as told by your provider. You may also be told to put on a jelly that prevents moisture from entering the wound. Cover your wound with a bandage as told by your provider. Small or very minor abrasions may not need a bandage. Keep your bandage clean and dry as told by your provider. There are many ways to close and cover a wound. Follow instructions from your provider about changing and removing your bandage. You may have to change your bandage one or more times a day, or as told by your provider. Check your wound every day for signs of infection. Check for: Redness. Watch for a red streak that spreads out from your wound. Swelling or worse pain. Warmth. Blood, fluid, pus, or a bad smell. Managing pain and swelling  If told, put ice on the injured area. Put ice in a plastic bag. Place a towel between your skin and the bag. Leave the ice on for 20 minutes, 2-3 times a day. If your skin turns bright red, remove the ice right away to prevent skin damage. The risk of damage is higher if you cannot feel pain, heat, or cold. If possible, raise the injured area above the level of your heart while you are sitting or lying down. General instructions Do not take baths, swim, or use a hot tub until your  provider approves. Ask your provider if you may take showers. You may only be allowed to take sponge baths. Do not scratch or pick at scabs that may occur over the wound as it heals. Contact a health care provider if: You got a tetanus shot, and you have swelling, severe pain, redness, or bleeding at the site of your shot. Your pain is worse and medicines do not help. You have a fever. You have any of signs of infection. Get help right away if: You have a red streak spreading away from your wound. This information is not intended to replace advice given to you by your health care provider. Make sure you  discuss any questions you have with your health care provider. Document Revised: 04/20/2022 Document Reviewed: 04/20/2022 Elsevier Patient Education  2024 Else[employer] Inc.Contusion A contusion is a deep bruise. Contusions are the result of a blunt injury to tissues and muscle fibers under the skin. The injury causes bleeding under the skin. The skin over the contusion may turn blue, purple, or yellow. Minor injuries will give you a painless contusion, but more severe injuries cause contusions that can stay painful and swollen for a few weeks. Follow these instructions at home: Pay attention to any changes in your symptoms. Let your health care provider know about them. Take these actions to relieve your pain. Managing pain, stiffness, and swelling  Use resting, icing, applying pressure (compression), and raising (elevating) the injured area. This is often called the RICE method. Rest the injured area. Return to your normal activities as told by your health care provider. Ask your health care provider what activities are safe for you. If directed, put ice on the injured area. To do this: Put ice in a plastic bag. Place a towel between your skin and the bag. Leave the ice on for 20 minutes, 2-3 times a day. If your skin turns bright red, remove the ice right away to prevent skin damage. The risk of  skin damage is higher if you cannot feel pain, heat, or cold. If directed, apply light compression to the injured area using an elastic bandage. Make sure the bandage is not wrapped too tightly. Remove and reapply the bandage as directed by your health care provider. If possible, elevate the injured area above the level of your heart while you are sitting or lying down. General instructions Take over-the-counter and prescription medicines only as told by your health care provider. Keep all follow-up visits. Your health care provider may want to see how your contusion is healing with treatment. Contact a health care provider if: Your symptoms do not improve after several days of treatment. Your symptoms get worse. You have difficulty moving the injured area. Get help right away if: You have severe pain. You have numbness in a hand or foot. Your hand or foot turns pale or cold. This information is not intended to replace advice given to you by your health care provider. Make sure you discuss any questions you have with your health care provider. Document Revised: 07/31/2021 Document Reviewed: 07/31/2021 Elsevier Patient Education  2024 [employer].

## 2023-05-14 ENCOUNTER — Telehealth: Payer: Self-pay | Admitting: Registered Nurse

## 2023-05-14 ENCOUNTER — Encounter: Payer: Self-pay | Admitting: Registered Nurse

## 2023-05-14 DIAGNOSIS — Z0000 Encounter for general adult medical examination without abnormal findings: Secondary | ICD-10-CM

## 2023-05-14 DIAGNOSIS — E7849 Other hyperlipidemia: Secondary | ICD-10-CM

## 2023-05-14 DIAGNOSIS — I10 Essential (primary) hypertension: Secondary | ICD-10-CM

## 2023-05-14 MED ORDER — SIMVASTATIN 20 MG PO TABS
20.0000 mg | ORAL_TABLET | Freq: Every day | ORAL | 0 refills | Status: DC
Start: 2023-05-14 — End: 2023-09-12

## 2023-05-14 MED ORDER — AMLODIPINE BESYLATE 10 MG PO TABS: 10.0000 mg | ORAL_TABLET | Freq: Every day | ORAL | Status: DC

## 2023-05-14 NOTE — Telephone Encounter (Signed)
 Patient last filled amlodipine and atenolol 01/29/2023 and simvastatin 10/04/2022 90 day supplies each from [facility].  Saw PCM 01/04/23   Last labs   Latest Reference Range & Units 01/04/23 14:15  Sodium 135 - 145 mEq/L 141  Potassium 3.5 - 5.1 mEq/L 3.6  Chloride 96 - 112 mEq/L 106  CO2 19 - 32 mEq/L 28  Glucose 70 - 99 mg/dL 91  BUN 6 - 23 mg/dL 15  Creatinine 0.93 - 2.35 mg/dL 5.73  Calcium 8.4 - 22.0 mg/dL 9.5  Alkaline Phosphatase 39 - 117 U/L 81  Albumin 3.5 - 5.2 g/dL 4.5  AST 0 - 37 U/L 19  ALT 0 - 35 U/L 13  Total Protein 6.0 - 8.3 g/dL 7.5  Bilirubin, Direct 0.0 - 0.3 mg/dL 0.1  Total Bilirubin 0.2 - 1.2 mg/dL 0.5  GFR >25.42 mL/min 54.55 (L)  Total CHOL/HDL Ratio  3  Cholesterol 0 - 200 mg/dL 706  HDL Cholesterol >23.76 mg/dL 28.31  LDL (calc) 0 - 99 mg/dL 91  NonHDL  517.61  Triglycerides 0.0 - 149.0 mg/dL 607.3 (H)  VLDL 0.0 - 71.0 mg/dL 62.6  VITD 94.85 - 462.70 ng/mL 16.38 (L)  WBC 4.0 - 10.5 K/uL 8.1  RBC 3.87 - 5.11 Mil/uL 4.30  Hemoglobin 12.0 - 15.0 g/dL 35.0  HCT 09.3 - 81.8 % 38.1  MCV 78.0 - 100.0 fl 88.8  MCHC 30.0 - 36.0 g/dL 29.9  RDW 37.1 - 69.6 % 14.4  Platelets 150.0 - 400.0 K/uL 225.0  Neutrophils 43.0 - 77.0 % 58.8  Lymphocytes 12.0 - 46.0 % 30.8  Monocytes Relative 3.0 - 12.0 % 8.4  Eosinophil 0.0 - 5.0 % 1.3  Basophil 0.0 - 3.0 % 0.7  NEUT# 1.4 - 7.7 K/uL 4.8  Lymphs Abs 0.7 - 4.0 K/uL 2.5  Monocyte # 0.1 - 1.0 K/uL 0.7  Eosinophils Absolute 0.0 - 0.7 K/uL 0.1  Basophils Absolute 0.0 - 0.1 K/uL 0.1  TSH 0.35 - 5.50 uIU/mL 0.76  (L): Data is abnormally low (H): Data is abnormally high Dispensed 90 day supply simvastatin 20mg  po daily and amlodipine 10mg  to patient today from [facility].  Patient verbalized understanding information/instructions, agreed with plan of care and had no furher questions at this time.

## 2023-06-07 ENCOUNTER — Telehealth: Payer: Self-pay | Admitting: Registered Nurse

## 2023-06-07 MED ORDER — ATENOLOL 50 MG PO TABS
100.0000 mg | ORAL_TABLET | Freq: Every day | ORAL | Status: DC
Start: 2023-06-07 — End: 2023-08-15

## 2023-06-07 NOTE — Telephone Encounter (Signed)
 Last filled 02/18/2023 [facility] 180 tabs last BP 139/73 02/26/2023  Saw PCM 01/04/23   Last labs    Latest Reference Range & Units 01/04/23 14:15  Sodium 135 - 145 mEq/L 141  Potassium 3.5 - 5.1 mEq/L 3.6  Chloride 96 - 112 mEq/L 106  CO2 19 - 32 mEq/L 28  Glucose 70 - 99 mg/dL 91  BUN 6 - 23 mg/dL 15  Creatinine 1.61 - 0.96 mg/dL 0.45  Calcium 8.4 - 40.9 mg/dL 9.5  Alkaline Phosphatase 39 - 117 U/L 81  Albumin 3.5 - 5.2 g/dL 4.5  AST 0 - 37 U/L 19  ALT 0 - 35 U/L 13  Total Protein 6.0 - 8.3 g/dL 7.5  Bilirubin, Direct 0.0 - 0.3 mg/dL 0.1  Total Bilirubin 0.2 - 1.2 mg/dL 0.5  GFR >81.19 mL/min 54.55 (L)  Total CHOL/HDL Ratio   3  Cholesterol 0 - 200 mg/dL 147  HDL Cholesterol >82.95 mg/dL 62.13  LDL (calc) 0 - 99 mg/dL 91  NonHDL   086.57  Triglycerides 0.0 - 149.0 mg/dL 846.9 (H)  VLDL 0.0 - 62.9 mg/dL 52.8  VITD 41.32 - 440.10 ng/mL 16.38 (L)  WBC 4.0 - 10.5 K/uL 8.1  RBC 3.87 - 5.11 Mil/uL 4.30  Hemoglobin 12.0 - 15.0 g/dL 27.2  HCT 53.6 - 64.4 % 38.1  MCV 78.0 - 100.0 fl 88.8  MCHC 30.0 - 36.0 g/dL 03.4  RDW 74.2 - 59.5 % 14.4  Platelets 150.0 - 400.0 K/uL 225.0  Neutrophils 43.0 - 77.0 % 58.8  Lymphocytes 12.0 - 46.0 % 30.8  Monocytes Relative 3.0 - 12.0 % 8.4  Eosinophil 0.0 - 5.0 % 1.3  Basophil 0.0 - 3.0 % 0.7  NEUT# 1.4 - 7.7 K/uL 4.8  Lymphs Abs 0.7 - 4.0 K/uL 2.5  Monocyte # 0.1 - 1.0 K/uL 0.7  Eosinophils Absolute 0.0 - 0.7 K/uL 0.1  Basophils Absolute 0.0 - 0.1 K/uL 0.1  TSH 0.35 - 5.50 uIU/mL 0.76  (L): Data is abnormally low (H): Data is abnormally high Dispensed 90 day supply atenolol 50mg  take 2 po daily #180 today from [facility].  Patient verbalized understanding information/instructions, agreed with plan of care and had no furher questions at this time.

## 2023-06-09 NOTE — Telephone Encounter (Signed)
 BP 120/79 02/19/2023 ER visit met Be Well 2026 requirements.  Labs 01/04/23 LDL 91    No Hgba1c on file.  Patient to see RN Thersia Flax to sign Be Well 2026 ROI and tobacco attestation and have current weight/height/BMI completed.

## 2023-07-12 ENCOUNTER — Encounter: Payer: Self-pay | Admitting: Family Medicine

## 2023-07-12 ENCOUNTER — Ambulatory Visit: Payer: No Typology Code available for payment source | Admitting: Family Medicine

## 2023-07-12 VITALS — BP 124/70 | HR 89 | Temp 98.9°F | Ht 62.0 in | Wt 202.0 lb

## 2023-07-12 DIAGNOSIS — Z1211 Encounter for screening for malignant neoplasm of colon: Secondary | ICD-10-CM

## 2023-07-12 DIAGNOSIS — E038 Other specified hypothyroidism: Secondary | ICD-10-CM | POA: Diagnosis not present

## 2023-07-12 DIAGNOSIS — I10 Essential (primary) hypertension: Secondary | ICD-10-CM

## 2023-07-12 DIAGNOSIS — E785 Hyperlipidemia, unspecified: Secondary | ICD-10-CM | POA: Diagnosis not present

## 2023-07-12 LAB — HEPATIC FUNCTION PANEL
ALT: 14 U/L (ref 0–35)
AST: 20 U/L (ref 0–37)
Albumin: 4.3 g/dL (ref 3.5–5.2)
Alkaline Phosphatase: 80 U/L (ref 39–117)
Bilirubin, Direct: 0.1 mg/dL (ref 0.0–0.3)
Total Bilirubin: 0.5 mg/dL (ref 0.2–1.2)
Total Protein: 7.2 g/dL (ref 6.0–8.3)

## 2023-07-12 LAB — LIPID PANEL
Cholesterol: 181 mg/dL (ref 0–200)
HDL: 64.3 mg/dL (ref >39.00)
LDL Cholesterol: 101 mg/dL — ABNORMAL HIGH (ref 0–99)
NonHDL: 116.96
Total CHOL/HDL Ratio: 3
Triglycerides: 79 mg/dL (ref 0.0–149.0)
VLDL: 15.8 mg/dL (ref 0.0–40.0)

## 2023-07-12 LAB — CBC WITH DIFFERENTIAL/PLATELET
Basophils Absolute: 0 10*3/uL (ref 0.0–0.1)
Basophils Relative: 0.6 % (ref 0.0–3.0)
Eosinophils Absolute: 0.1 10*3/uL (ref 0.0–0.7)
Eosinophils Relative: 1.8 % (ref 0.0–5.0)
HCT: 39.6 % (ref 36.0–46.0)
Hemoglobin: 13.1 g/dL (ref 12.0–15.0)
Lymphocytes Relative: 34.4 % (ref 12.0–46.0)
Lymphs Abs: 1.9 10*3/uL (ref 0.7–4.0)
MCHC: 33.2 g/dL (ref 30.0–36.0)
MCV: 87 fl (ref 78.0–100.0)
Monocytes Absolute: 0.4 10*3/uL (ref 0.1–1.0)
Monocytes Relative: 7.5 % (ref 3.0–12.0)
Neutro Abs: 3 10*3/uL (ref 1.4–7.7)
Neutrophils Relative %: 55.7 % (ref 43.0–77.0)
Platelets: 207 10*3/uL (ref 150.0–400.0)
RBC: 4.55 Mil/uL (ref 3.87–5.11)
RDW: 14.3 % (ref 11.5–15.5)
WBC: 5.4 10*3/uL (ref 4.0–10.5)

## 2023-07-12 LAB — BASIC METABOLIC PANEL WITH GFR
BUN: 11 mg/dL (ref 6–23)
CO2: 29 meq/L (ref 19–32)
Calcium: 9.4 mg/dL (ref 8.4–10.5)
Chloride: 104 meq/L (ref 96–112)
Creatinine, Ser: 0.65 mg/dL (ref 0.40–1.20)
GFR: 92.07 mL/min (ref >60.00)
Glucose, Bld: 95 mg/dL (ref 70–99)
Potassium: 4.1 meq/L (ref 3.5–5.1)
Sodium: 140 meq/L (ref 135–145)

## 2023-07-12 NOTE — Assessment & Plan Note (Signed)
Chronic problem.  Currently on Simvastatin 20mg daily w/o difficulty.  Check labs.  Adjust meds prn  

## 2023-07-12 NOTE — Assessment & Plan Note (Signed)
 Chronic problem.  On Amlodipine  and Atenolol  daily w/ good control.  Currently asymptomatic.  No med changes at this time

## 2023-07-12 NOTE — Assessment & Plan Note (Signed)
 Deteriorated.  Pt has gained 5 lbs since last visit.  BMI now 36.95  No regular exercise, not following a particular diet.  Stressed the need for both.

## 2023-07-12 NOTE — Assessment & Plan Note (Signed)
 Chronic problem.  On Levothyroxine  25mcg daily and asymptomatic.  Check labs.  Adjust meds prn

## 2023-07-12 NOTE — Patient Instructions (Signed)
 Schedule your complete physical in 6 months We'll notify you of your lab results and make any changes if needed Try and make healthy food choices and get regular exercise- you can do it! Call with any questions or concerns Stay Safe!  Stay Healthy! Happy Early [place]!!!

## 2023-07-12 NOTE — Progress Notes (Signed)
   Subjective:    Patient ID: Jamie Kelley, female    DOB: May 20, 1955, 68 y.o.   MRN: [id number]  HPI HTN- chronic problem, on Amlodipine  10mg  daily and Atenolol  100mg  daily w/ good control.  No CP, SOB, HA's, visual changes, edema.  Hyperlipidemia- chronic problem, on Simvastatin  20mg  daily.  No abd pain, N/V.  Hypothyroid- chronic problem, on Levothyroxine  25mcg daily.  Denies excessive fatigue, changes to skin/hair/nails  Obesity- deteriorated.  Pt has gained 5 lbs since last visit.  BMI 36.95.  no regular exercise.  Not following a particular diet.   Review of Systems For ROS see HPI     Objective:   Physical Exam Vitals reviewed.  Constitutional:      General: She is not in acute distress.    Appearance: Normal appearance. She is well-developed. She is obese. She is not ill-appearing.  HENT:     Head: Normocephalic and atraumatic.  Eyes:     Conjunctiva/sclera: Conjunctivae normal.     Pupils: Pupils are equal, round, and reactive to light.  Neck:     Thyroid : Thyromegaly (multinodular goiter) present.  Cardiovascular:     Rate and Rhythm: Normal rate and regular rhythm.     Heart sounds: Normal heart sounds. No murmur heard. Pulmonary:     Effort: Pulmonary effort is normal. No respiratory distress.     Breath sounds: Normal breath sounds.  Abdominal:     General: There is no distension.     Palpations: Abdomen is soft.     Tenderness: There is no abdominal tenderness.  Musculoskeletal:     Cervical back: Normal range of motion and neck supple.  Lymphadenopathy:     Cervical: No cervical adenopathy.  Skin:    General: Skin is warm and dry.  Neurological:     Mental Status: She is alert and oriented to person, place, and time.  Psychiatric:        Behavior: Behavior normal.           Assessment & Plan:

## 2023-07-16 ENCOUNTER — Ambulatory Visit: Payer: Self-pay | Admitting: Family Medicine

## 2023-07-16 ENCOUNTER — Other Ambulatory Visit: Payer: Self-pay

## 2023-07-16 DIAGNOSIS — Z1211 Encounter for screening for malignant neoplasm of colon: Secondary | ICD-10-CM

## 2023-07-16 DIAGNOSIS — Z124 Encounter for screening for malignant neoplasm of cervix: Secondary | ICD-10-CM

## 2023-07-16 LAB — TSH: TSH: 1.09 u[IU]/mL (ref 0.35–5.50)

## 2023-08-14 ENCOUNTER — Telehealth: Payer: Self-pay | Admitting: Registered Nurse

## 2023-08-14 ENCOUNTER — Encounter: Payer: Self-pay | Admitting: Registered Nurse

## 2023-08-14 DIAGNOSIS — E039 Hypothyroidism, unspecified: Secondary | ICD-10-CM

## 2023-08-15 MED ORDER — ATENOLOL 50 MG PO TABS: 100.0000 mg | ORAL_TABLET | Freq: Every day | ORAL | 0 refills | Status: AC

## 2023-08-15 MED ORDER — LEVOTHYROXINE SODIUM 50 MCG PO TABS: 25.0000 ug | ORAL_TABLET | Freq: Every day | ORAL | 0 refills | Status: DC

## 2023-08-15 NOTE — Telephone Encounter (Signed)
 Last filled 12/06/2022 90 tabs levothyroxine  50mcg and atenolol  50mg  sig t2 po daily #180 06/07/2023 BP 124/70 07/12/23  dispensed 30 tabs levothyroxine  50mcg and 180 tabs atenolol  50mg  to patient today from [facility]   Latest Reference Range & Units 07/12/23 09:11  Sodium 135 - 145 mEq/L 140  Potassium 3.5 - 5.1 mEq/L 4.1  Chloride 96 - 112 mEq/L 104  CO2 19 - 32 mEq/L 29  Glucose 70 - 99 mg/dL 95  BUN 6 - 23 mg/dL 11  Creatinine 5.28 - 4.13 mg/dL 2.44  Calcium 8.4 - 01.0 mg/dL 9.4  Alkaline Phosphatase 39 - 117 U/L 80  Albumin 3.5 - 5.2 g/dL 4.3  AST 0 - 37 U/L 20  ALT 0 - 35 U/L 14  Total Protein 6.0 - 8.3 g/dL 7.2  Bilirubin, Direct 0.0 - 0.3 mg/dL 0.1  Total Bilirubin 0.2 - 1.2 mg/dL 0.5  GFR >27.25 mL/min 92.07  Total CHOL/HDL Ratio  3  Cholesterol 0 - 200 mg/dL 366  HDL Cholesterol >44.03 mg/dL 47.42  LDL (calc) 0 - 99 mg/dL 595 (H)  NonHDL  638.75  Triglycerides 0.0 - 149.0 mg/dL 64.3  VLDL 0.0 - 32.9 mg/dL 51.8  WBC 4.0 - 84.1 K/uL 5.4  RBC 3.87 - 5.11 Mil/uL 4.55  Hemoglobin 12.0 - 15.0 g/dL 66.0  HCT 63.0 - 16.0 % 39.6  MCV 78.0 - 100.0 fl 87.0  MCHC 30.0 - 36.0 g/dL 10.9  RDW 32.3 - 55.7 % 14.3  Platelets 150.0 - 400.0 K/uL 207.0  Neutrophils 43.0 - 77.0 % 55.7  Lymphocytes 12.0 - 46.0 % 34.4  Monocytes Relative 3.0 - 12.0 % 7.5  Eosinophil 0.0 - 5.0 % 1.8  Basophil 0.0 - 3.0 % 0.6  NEUT# 1.4 - 7.7 K/uL 3.0  Lymphs Abs 0.7 - 4.0 K/uL 1.9  Monocyte # 0.1 - 1.0 K/uL 0.4  Eosinophils Absolute 0.0 - 0.7 K/uL 0.1  Basophils Absolute 0.0 - 0.1 K/uL 0.0  TSH 0.35 - 5.50 uIU/mL 1.09  (H): Data is abnormally high

## 2023-09-12 ENCOUNTER — Telehealth: Payer: Self-pay | Admitting: Registered Nurse

## 2023-09-12 ENCOUNTER — Encounter: Payer: Self-pay | Admitting: Registered Nurse

## 2023-09-12 DIAGNOSIS — E7849 Other hyperlipidemia: Secondary | ICD-10-CM

## 2023-09-12 DIAGNOSIS — I10 Essential (primary) hypertension: Secondary | ICD-10-CM

## 2023-09-12 MED ORDER — SIMVASTATIN 20 MG PO TABS: 20.0000 mg | ORAL_TABLET | Freq: Every day | ORAL | 1 refills | Status: AC

## 2023-09-12 MED ORDER — AMLODIPINE BESYLATE 10 MG PO TABS: 10.0000 mg | ORAL_TABLET | Freq: Every day | ORAL | 1 refills | Status: AC

## 2023-09-12 NOTE — Telephone Encounter (Signed)
 Patient requested refill amlodipine  10mg  po daily and simvastatin  20mg  po daily last filled 90 tabs 05/14/2023  Last BP 124/70 PCM visit labs    181 184 CM 183 CM 178 CM 190 CM 173 CM 214 High  CM   Comment: ATP III Classification       Desirable:  < 200 mg/dL               Borderline High:  200 - 239 mg/dL          High:  > = 759 mg/dL  Triglycerides 20.9 838.9 High  CM 92.0 CM 73.0 CM 108.0 CM 79.0 CM 95.0 CM  Comment: Normal:  <150 mg/dLBorderline High:  150 - 199 mg/dL  HDL 35.69 38.79 36.69 62.00 64.60 61.30 68.90  VLDL 15.8 32.2 18.4 14.6 21.6 15.8 19.0  LDL Cholesterol 101 High  91 102 High  101 High  104 High  96 126 High   Total CHOL/HDL Ratio 3 3 CM 3 CM 3 CM 3 CM 3 CM 3 CM  Comment:                Men          Women1/2 Average Risk     3.4          3.3Average Risk          5.0          4.42X Average Risk          9.6          7.13X Average Risk          15.0          11.0                      NonHDL 116.96 122.86 CM 119.91 CM 116.07 CM 125.62 CM 111.64 CM 145.23 CM  Comment: NOTE:  Non-HDL goal should be 30 mg/dL higher than patient's LDL goal (i.e. LDL goal of < 70 mg/dL, would have non-HDL goal o    Latest Reference Range & Units 07/12/23 09:11  Sodium 135 - 145 mEq/L 140  Potassium 3.5 - 5.1 mEq/L 4.1  Chloride 96 - 112 mEq/L 104  CO2 19 - 32 mEq/L 29  Glucose 70 - 99 mg/dL 95  BUN 6 - 23 mg/dL 11  Creatinine 9.59 - 8.79 mg/dL 9.34  Calcium 8.4 - 89.4 mg/dL 9.4  Alkaline Phosphatase 39 - 117 U/L 80  Albumin 3.5 - 5.2 g/dL 4.3  AST 0 - 37 U/L 20  ALT 0 - 35 U/L 14  Total Protein 6.0 - 8.3 g/dL 7.2  Bilirubin, Direct 0.0 - 0.3 mg/dL 0.1  Total Bilirubin 0.2 - 1.2 mg/dL 0.5  GFR >39.99 mL/min 92.07  Total CHOL/HDL Ratio  3  Cholesterol 0 - 200 mg/dL 818  HDL Cholesterol >60.99 mg/dL 35.69  LDL (calc) 0 - 99 mg/dL 898 (H)  NonHDL  883.03  Triglycerides 0.0 - 149.0 mg/dL 20.9  VLDL 0.0 - 59.9 mg/dL 84.1  WBC 4.0 - 89.4 K/uL 5.4  RBC 3.87 - 5.11 Mil/uL 4.55   Hemoglobin 12.0 - 15.0 g/dL 86.8  HCT 63.9 - 53.9 % 39.6  MCV 78.0 - 100.0 fl 87.0  MCHC 30.0 - 36.0 g/dL 66.7  RDW 88.4 - 84.4 % 14.3  Platelets 150.0 - 400.0 K/uL 207.0  Neutrophils 43.0 - 77.0 % 55.7  Lymphocytes 12.0 - 46.0 % 34.4  Monocytes Relative 3.0 - 12.0 % 7.5  Eosinophil 0.0 - 5.0 % 1.8  Basophil 0.0 - 3.0 % 0.6  NEUT# 1.4 - 7.7 K/uL 3.0  Lymphs Abs 0.7 - 4.0 K/uL 1.9  Monocyte # 0.1 - 1.0 K/uL 0.4  Eosinophils Absolute 0.0 - 0.7 K/uL 0.1  Basophils Absolute 0.0 - 0.1 K/uL 0.0  TSH 0.35 - 5.50 uIU/mL 1.09  (H): Data is abnormally high  Dispensed 90 tabs to patient today amlodipine  10mg  and simvastatin  20mg   next labs due May 2026  Patient verbalized understanding information/instructions, agreed with plan of care and had no further questions at this time.

## 2023-11-05 IMAGING — MG MM DIGITAL SCREENING BILAT W/ TOMO AND CAD
8 series · 8 of 24 positions shown · non-contrast
Comparison: Previous exam(s).

CLINICAL DATA: Screening.

EXAM:
DIGITAL SCREENING BILATERAL MAMMOGRAM WITH TOMOSYNTHESIS AND CAD
TECHNIQUE: Bilateral screening digital craniocaudal and mediolateral oblique
mammograms were obtained. Bilateral screening digital breast
tomosynthesis was performed. The images were evaluated with
computer-aided detection.

[R MLO synth-2D]
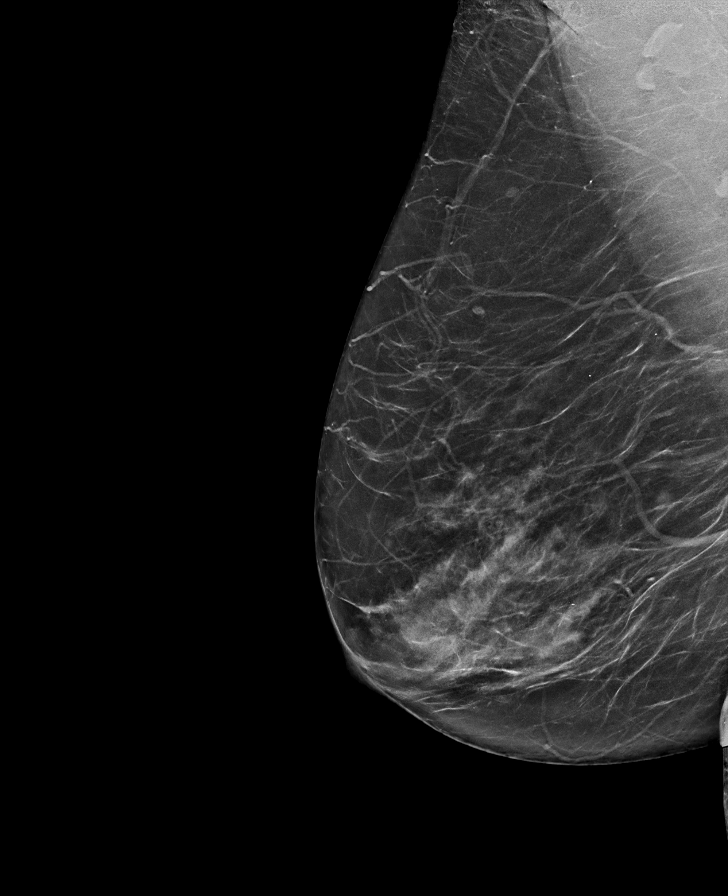

[R CC synth-2D]
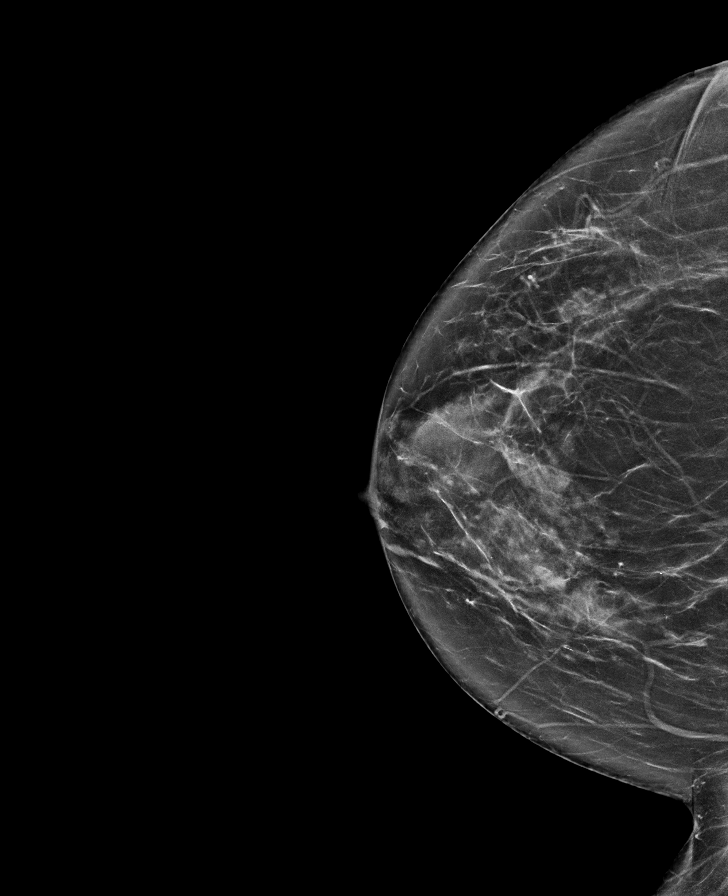

[L MLO synth-2D]
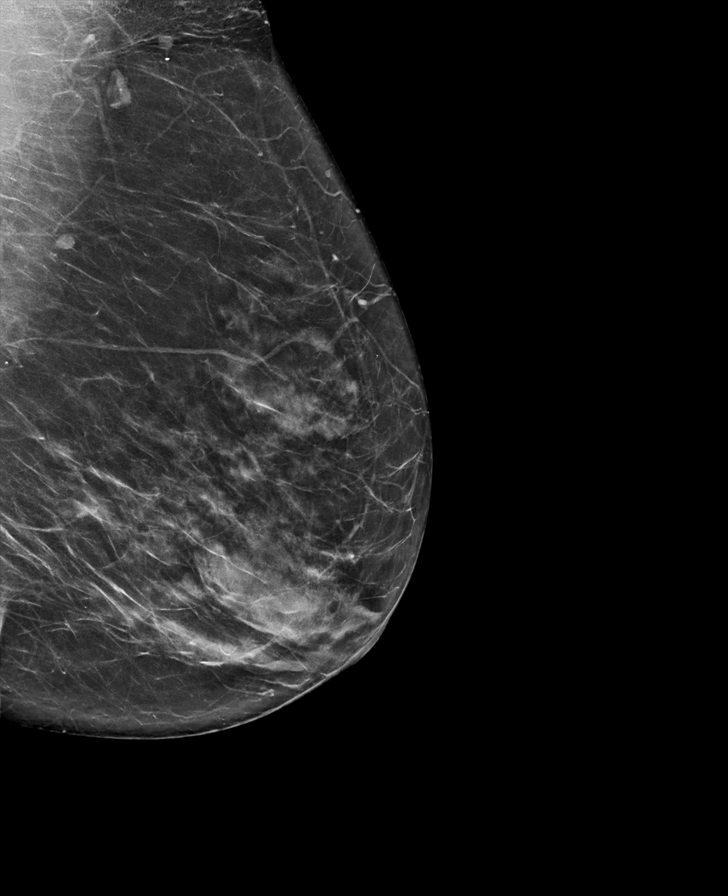

[L CC synth-2D]
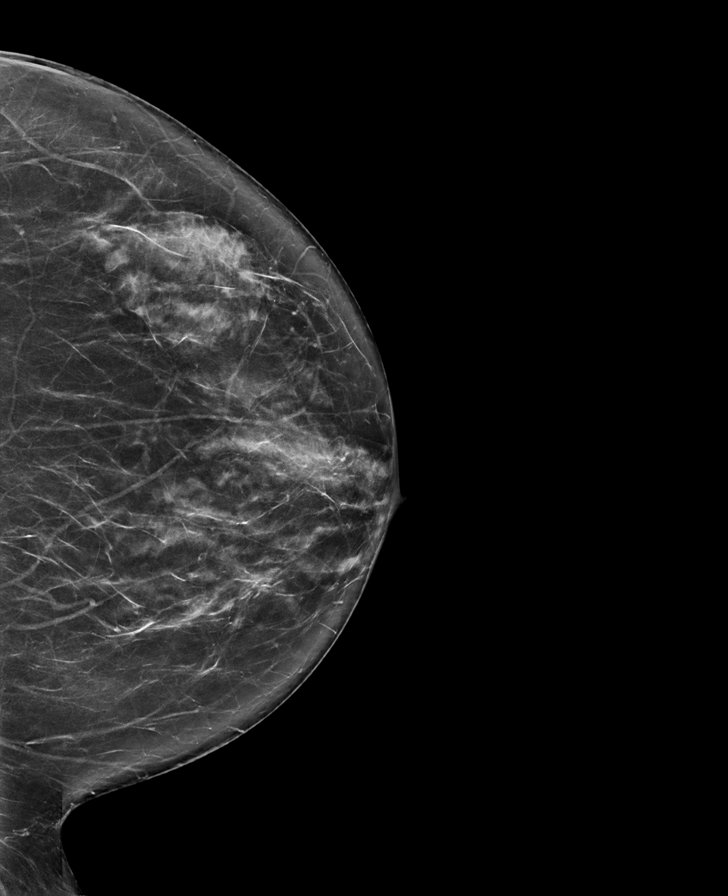

[L CC tomo · tomo slice 37/74.0]
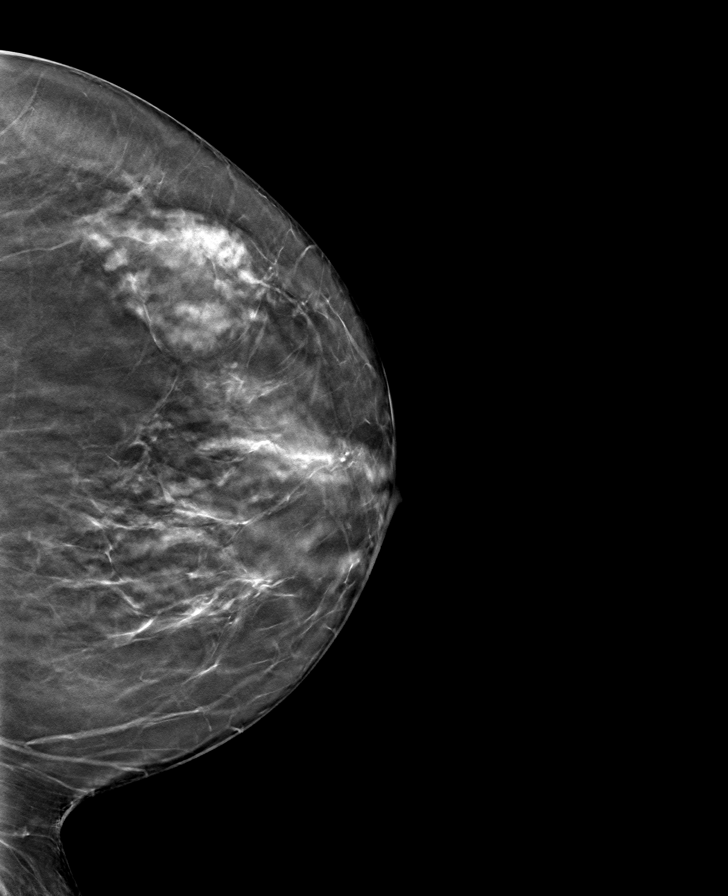

[R MLO tomo · tomo slice 37/72.0]
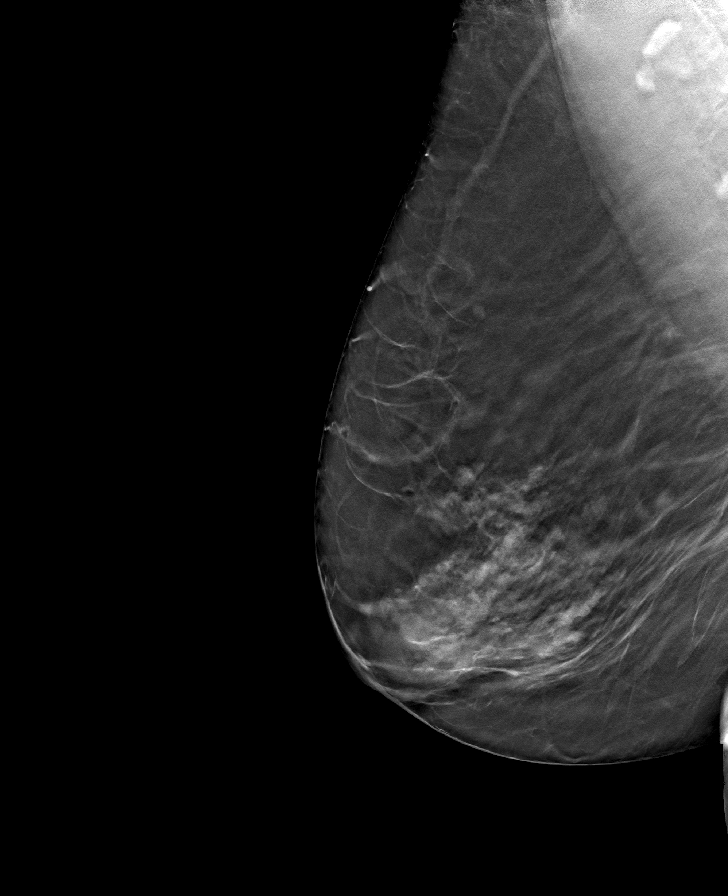

[L MLO tomo · tomo slice 38/75.0]
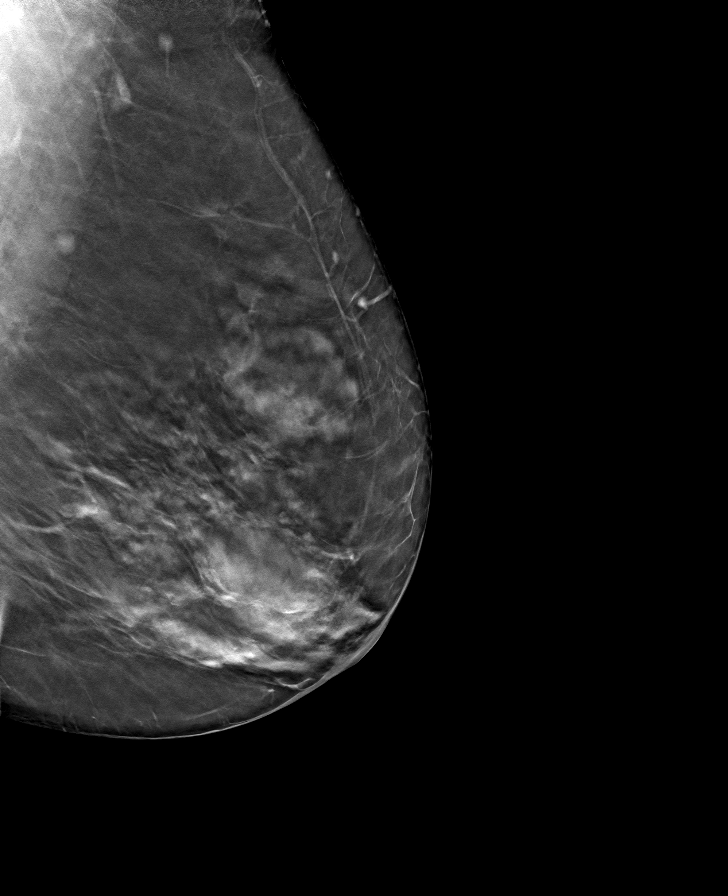

[R CC tomo · tomo slice 37/74.0]
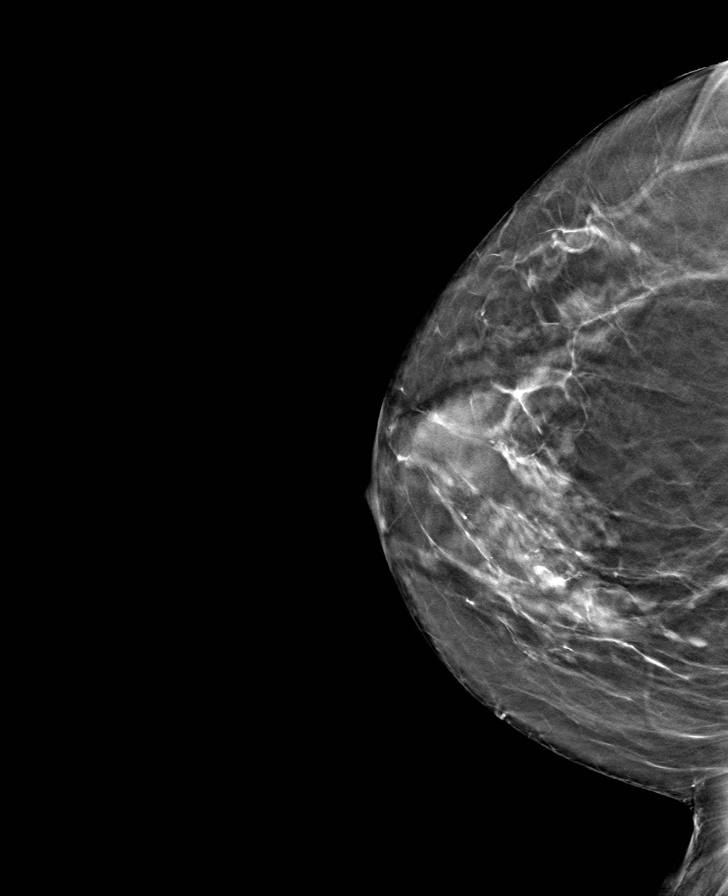

[8 of 24 positions shown; findings below may reference images not displayed]

ACR Breast Density Category c: The breast tissue is heterogeneously
dense, which may obscure small masses.
FINDINGS: There are no findings suspicious for malignancy.
IMPRESSION: No mammographic evidence of malignancy. A result letter of this
screening mammogram will be mailed directly to the patient.

RECOMMENDATION:
Screening mammogram in one year. (Code:Q3-W-BC3)

BI-RADS CATEGORY  1: Negative.

## 2023-12-31 ENCOUNTER — Encounter: Payer: Self-pay | Admitting: Registered Nurse

## 2023-12-31 ENCOUNTER — Telehealth: Payer: Self-pay | Admitting: Registered Nurse

## 2023-12-31 DIAGNOSIS — I10 Essential (primary) hypertension: Secondary | ICD-10-CM

## 2023-12-31 DIAGNOSIS — E7849 Other hyperlipidemia: Secondary | ICD-10-CM

## 2023-12-31 DIAGNOSIS — E039 Hypothyroidism, unspecified: Secondary | ICD-10-CM

## 2023-12-31 MED ORDER — LEVOTHYROXINE SODIUM 50 MCG PO TABS: 25.0000 ug | ORAL_TABLET | Freq: Every day | ORAL | 0 refills | Status: AC

## 2023-12-31 NOTE — Telephone Encounter (Signed)
 Last filled amlodipine  and simvastatin  90 tabs 09/12/23; levothyroxine  50mcg 30 tabs and atenolol  180 tabs 08/15/23 last BP 124/70 07/12/23 HR 89    Latest Reference Range & Units 07/12/23 09:11  Sodium 135 - 145 mEq/L 140  Potassium 3.5 - 5.1 mEq/L 4.1  Chloride 96 - 112 mEq/L 104  CO2 19 - 32 mEq/L 29  Glucose 70 - 99 mg/dL 95  BUN 6 - 23 mg/dL 11  Creatinine 9.59 - 8.79 mg/dL 9.34  Calcium 8.4 - 89.4 mg/dL 9.4  Alkaline Phosphatase 39 - 117 U/L 80  Albumin 3.5 - 5.2 g/dL 4.3  AST 0 - 37 U/L 20  ALT 0 - 35 U/L 14  Total Protein 6.0 - 8.3 g/dL 7.2  Bilirubin, Direct 0.0 - 0.3 mg/dL 0.1  Total Bilirubin 0.2 - 1.2 mg/dL 0.5  GFR >39.99 mL/min 92.07  Total CHOL/HDL Ratio  3  Cholesterol 0 - 200 mg/dL 818  HDL Cholesterol >60.99 mg/dL 35.69  LDL (calc) 0 - 99 mg/dL 898 (H)  NonHDL  883.03  Triglycerides 0.0 - 149.0 mg/dL 20.9  VLDL 0.0 - 59.9 mg/dL 84.1  WBC 4.0 - 89.4 K/uL 5.4  RBC 3.87 - 5.11 Mil/uL 4.55  Hemoglobin 12.0 - 15.0 g/dL 86.8  HCT 63.9 - 53.9 % 39.6  MCV 78.0 - 100.0 fl 87.0  MCHC 30.0 - 36.0 g/dL 66.7  RDW 88.4 - 84.4 % 14.3  Platelets 150.0 - 400.0 K/uL 207.0  Neutrophils 43.0 - 77.0 % 55.7  Lymphocytes 12.0 - 46.0 % 34.4  Monocytes Relative 3.0 - 12.0 % 7.5  Eosinophil 0.0 - 5.0 % 1.8  Basophil 0.0 - 3.0 % 0.6  NEUT# 1.4 - 7.7 K/uL 3.0  Lymphs Abs 0.7 - 4.0 K/uL 1.9  Monocyte # 0.1 - 1.0 K/uL 0.4  Eosinophils Absolute 0.0 - 0.7 K/uL 0.1  Basophils Absolute 0.0 - 0.1 K/uL 0.0  TSH 0.35 - 5.50 uIU/mL 1.09  (H): Data is abnormally high  Dispensed to patient today amlodipine  10mg  po daily #90 RF0; atenolol  50mg  sig t2 po daily #180; levothyroxine  50mcg t1/2 po daily #90 RF0 and simvastatin  20mg  po daily #90 RF0  Next labs due May 2026  Patient verbalized understanding information/instructions, agreed with plan of care and had no further questions at this time.

## 2024-01-31 ENCOUNTER — Encounter: Admitting: Family Medicine

## 2024-02-05 ENCOUNTER — Encounter: Admitting: Family Medicine

## 2024-03-31 ENCOUNTER — Ambulatory Visit: Payer: Self-pay | Admitting: Registered Nurse

## 2024-03-31 ENCOUNTER — Encounter: Payer: Self-pay | Admitting: Registered Nurse

## 2024-03-31 VITALS — BP 148/84 | HR 72 | Temp 98.2°F | Resp 18

## 2024-03-31 DIAGNOSIS — J069 Acute upper respiratory infection, unspecified: Secondary | ICD-10-CM

## 2024-03-31 DIAGNOSIS — J4520 Mild intermittent asthma, uncomplicated: Secondary | ICD-10-CM | POA: Insufficient documentation

## 2024-04-03 ENCOUNTER — Telehealth: Payer: Self-pay | Admitting: Registered Nurse

## 2024-04-03 ENCOUNTER — Ambulatory Visit: Admitting: Family Medicine

## 2024-04-03 VITALS — BP 118/78 | HR 66 | Ht 62.0 in | Wt 202.8 lb

## 2024-04-03 DIAGNOSIS — B9689 Other specified bacterial agents as the cause of diseases classified elsewhere: Secondary | ICD-10-CM

## 2024-04-03 DIAGNOSIS — I10 Essential (primary) hypertension: Secondary | ICD-10-CM

## 2024-04-03 DIAGNOSIS — Z1211 Encounter for screening for malignant neoplasm of colon: Secondary | ICD-10-CM

## 2024-04-03 DIAGNOSIS — E559 Vitamin D deficiency, unspecified: Secondary | ICD-10-CM

## 2024-04-03 DIAGNOSIS — Z0000 Encounter for general adult medical examination without abnormal findings: Secondary | ICD-10-CM

## 2024-04-03 LAB — HEPATIC FUNCTION PANEL
ALT: 13 U/L (ref 3–35)
AST: 18 U/L (ref 5–37)
Albumin: 4.5 g/dL (ref 3.5–5.2)
Alkaline Phosphatase: 83 U/L (ref 39–117)
Bilirubin, Direct: 0 mg/dL — ABNORMAL LOW (ref 0.1–0.3)
Total Bilirubin: 0.4 mg/dL (ref 0.2–1.2)
Total Protein: 7.8 g/dL (ref 6.0–8.3)

## 2024-04-03 LAB — LIPID PANEL
Cholesterol: 178 mg/dL (ref 28–200)
HDL: 59.1 mg/dL
LDL Cholesterol: 104 mg/dL — ABNORMAL HIGH (ref 10–99)
NonHDL: 119.15
Total CHOL/HDL Ratio: 3
Triglycerides: 76 mg/dL (ref 10.0–149.0)
VLDL: 15.2 mg/dL (ref 0.0–40.0)

## 2024-04-03 LAB — CBC WITH DIFFERENTIAL/PLATELET
Basophils Absolute: 0 10*3/uL (ref 0.0–0.1)
Basophils Relative: 0.7 % (ref 0.0–3.0)
Eosinophils Absolute: 0.2 10*3/uL (ref 0.0–0.7)
Eosinophils Relative: 3 % (ref 0.0–5.0)
HCT: 40.6 % (ref 36.0–46.0)
Hemoglobin: 13.6 g/dL (ref 12.0–15.0)
Lymphocytes Relative: 30.6 % (ref 12.0–46.0)
Lymphs Abs: 2 10*3/uL (ref 0.7–4.0)
MCHC: 33.5 g/dL (ref 30.0–36.0)
MCV: 88.2 fl (ref 78.0–100.0)
Monocytes Absolute: 0.5 10*3/uL (ref 0.1–1.0)
Monocytes Relative: 8.3 % (ref 3.0–12.0)
Neutro Abs: 3.7 10*3/uL (ref 1.4–7.7)
Neutrophils Relative %: 57.4 % (ref 43.0–77.0)
Platelets: 196 10*3/uL (ref 150.0–400.0)
RBC: 4.6 Mil/uL (ref 3.87–5.11)
RDW: 14.5 % (ref 11.5–15.5)
WBC: 6.5 10*3/uL (ref 4.0–10.5)

## 2024-04-03 LAB — BASIC METABOLIC PANEL WITH GFR
BUN: 15 mg/dL (ref 6–23)
CO2: 29 meq/L (ref 19–32)
Calcium: 9.4 mg/dL (ref 8.4–10.5)
Chloride: 105 meq/L (ref 96–112)
Creatinine, Ser: 0.66 mg/dL (ref 0.40–1.20)
GFR: 91.27 mL/min
Glucose, Bld: 103 mg/dL — ABNORMAL HIGH (ref 70–99)
Potassium: 3.9 meq/L (ref 3.5–5.1)
Sodium: 141 meq/L (ref 135–145)

## 2024-04-03 LAB — TSH: TSH: 1.41 u[IU]/mL (ref 0.35–5.50)

## 2024-04-03 LAB — VITAMIN D 25 HYDROXY (VIT D DEFICIENCY, FRACTURES): VITD: 15.63 ng/mL — ABNORMAL LOW (ref 30.00–100.00)

## 2024-04-03 MED ORDER — AMOXICILLIN 875 MG PO TABS: 875.0000 mg | ORAL_TABLET | Freq: Two times a day (BID) | ORAL | 0 refills | Status: AC

## 2024-04-03 MED ORDER — GUAIFENESIN-CODEINE 100-10 MG/5ML PO SOLN: 5.0000 mL | ORAL | 0 refills | Status: AC | PRN

## 2024-04-03 NOTE — Progress Notes (Unsigned)
" ° °  Subjective:    Patient ID: Jamie Kelley, female    DOB: 1956/01/16, 69 y.o.   MRN: [id number]  HPI CPE- UTD on mammo, Tdap, PNA, flu.  Due for repeat cologuard.  URI- sxs started ~1 week ago.  Sxs are worsening despite OTC medication.  + HA, frontal sinus pressure, sore throat, cough, nausea.  Cough is occasionally productive.  Denies SOB.    Health Maintenance  Topic Date Due   Fecal DNA (Cologuard)  07/06/2022   Bone Density Scan  04/03/2025 (Originally 08/12/2022)   Mammogram  03/25/2025   DTaP/Tdap/Td (2 - Td or Tdap) 06/10/2030   Pneumococcal Vaccine: 50+ Years  Completed   Influenza Vaccine  Completed   Hepatitis C Screening  Completed   Meningococcal B Vaccine  Aged Out   COVID-19 Vaccine  Discontinued   Zoster Vaccines- Shingrix  Discontinued     Patient Care Team    Relationship Specialty Notifications Start End  Mahlon Comer BRAVO, MD PCP - General Family Medicine  05/29/12    Comment: Merged      Review of Systems Patient reports no vision/ hearing changes, adenopathy,fever, weight change,  persistant/recurrent hoarseness , swallowing issues, chest pain, palpitations, edema, persistant/recurrent cough, hemoptysis, dyspnea (rest/exertional/paroxysmal nocturnal), gastrointestinal bleeding (melena, rectal bleeding), abdominal pain, significant heartburn, bowel changes, GU symptoms (dysuria, hematuria, incontinence), Gyn symptoms (abnormal  bleeding, pain),  syncope, focal weakness, memory loss, numbness & tingling, skin/hair/nail changes, abnormal bruising or bleeding, anxiety, or depression.     Objective:   Physical Exam        Assessment & Plan:    "

## 2024-04-03 NOTE — Patient Instructions (Signed)
 Follow up in 6 months to recheck blood pressure and cholesterol We'll notify you of your lab results and make any changes if needed START the Amoxicillin  twice daily- take w/ food USE the cough syrup as needed Drink LOTS of fluids REST! Call with any questions or concerns Hang in there!!

## 2024-10-02 ENCOUNTER — Ambulatory Visit: Admitting: Family Medicine
